# Patient Record
Sex: Male | Born: 1940 | Race: White | Hispanic: No | Marital: Married | Smoking: Former smoker
Health system: Southern US, Community
[De-identification: ages and names within clinical notes are randomized; demographics above are authoritative.]

## PROBLEM LIST (undated history)

## (undated) DIAGNOSIS — E785 Hyperlipidemia, unspecified: Secondary | ICD-10-CM

## (undated) DIAGNOSIS — C3492 Malignant neoplasm of unspecified part of left bronchus or lung: Secondary | ICD-10-CM

## (undated) DIAGNOSIS — N200 Calculus of kidney: Secondary | ICD-10-CM

## (undated) DIAGNOSIS — M1990 Unspecified osteoarthritis, unspecified site: Secondary | ICD-10-CM

## (undated) DIAGNOSIS — J449 Chronic obstructive pulmonary disease, unspecified: Secondary | ICD-10-CM

## (undated) DIAGNOSIS — M545 Low back pain, unspecified: Secondary | ICD-10-CM

## (undated) DIAGNOSIS — K219 Gastro-esophageal reflux disease without esophagitis: Secondary | ICD-10-CM

## (undated) DIAGNOSIS — C801 Malignant (primary) neoplasm, unspecified: Secondary | ICD-10-CM

## (undated) DIAGNOSIS — I10 Essential (primary) hypertension: Secondary | ICD-10-CM

## (undated) DIAGNOSIS — I2510 Atherosclerotic heart disease of native coronary artery without angina pectoris: Secondary | ICD-10-CM

## (undated) HISTORY — DX: Low back pain, unspecified: M54.50

## (undated) HISTORY — DX: Hyperlipidemia, unspecified: E78.5

## (undated) HISTORY — DX: Gastro-esophageal reflux disease without esophagitis: K21.9

## (undated) HISTORY — DX: Unspecified osteoarthritis, unspecified site: M19.90

## (undated) HISTORY — DX: Chronic obstructive pulmonary disease, unspecified: J44.9

## (undated) HISTORY — DX: Calculus of kidney: N20.0

## (undated) HISTORY — DX: Low back pain: M54.5

## (undated) HISTORY — PX: CORONARY ARTERY BYPASS GRAFT: SHX141

## (undated) HISTORY — PX: APPENDECTOMY: SHX54

## (undated) HISTORY — DX: Malignant neoplasm of unspecified part of left bronchus or lung: C34.92

## (undated) HISTORY — PX: HEMORRHOID SURGERY: SHX153

## (undated) HISTORY — DX: Atherosclerotic heart disease of native coronary artery without angina pectoris: I25.10

---

## 2002-10-10 ENCOUNTER — Emergency Department (HOSPITAL_COMMUNITY): Admission: EM | Admit: 2002-10-10 | Discharge: 2002-10-10 | Payer: Self-pay | Admitting: Emergency Medicine

## 2002-10-24 ENCOUNTER — Encounter: Payer: Self-pay | Admitting: Internal Medicine

## 2002-10-24 ENCOUNTER — Ambulatory Visit (HOSPITAL_COMMUNITY): Admission: RE | Admit: 2002-10-24 | Discharge: 2002-10-24 | Payer: Self-pay | Admitting: Internal Medicine

## 2003-08-17 ENCOUNTER — Encounter: Payer: Self-pay | Admitting: Internal Medicine

## 2003-08-17 ENCOUNTER — Ambulatory Visit (HOSPITAL_COMMUNITY): Admission: RE | Admit: 2003-08-17 | Discharge: 2003-08-17 | Payer: Self-pay | Admitting: Internal Medicine

## 2003-10-03 ENCOUNTER — Encounter (INDEPENDENT_AMBULATORY_CARE_PROVIDER_SITE_OTHER): Payer: Self-pay | Admitting: Specialist

## 2003-10-03 ENCOUNTER — Ambulatory Visit (HOSPITAL_COMMUNITY): Admission: RE | Admit: 2003-10-03 | Discharge: 2003-10-03 | Payer: Self-pay | Admitting: General Surgery

## 2003-10-03 ENCOUNTER — Ambulatory Visit (HOSPITAL_BASED_OUTPATIENT_CLINIC_OR_DEPARTMENT_OTHER): Admission: RE | Admit: 2003-10-03 | Discharge: 2003-10-03 | Payer: Self-pay | Admitting: General Surgery

## 2004-10-10 ENCOUNTER — Ambulatory Visit: Payer: Self-pay | Admitting: Internal Medicine

## 2004-12-10 ENCOUNTER — Ambulatory Visit: Payer: Self-pay | Admitting: Internal Medicine

## 2004-12-30 ENCOUNTER — Ambulatory Visit: Payer: Self-pay | Admitting: Internal Medicine

## 2005-01-08 ENCOUNTER — Ambulatory Visit: Payer: Self-pay | Admitting: Cardiology

## 2005-01-09 ENCOUNTER — Ambulatory Visit: Payer: Self-pay | Admitting: Cardiology

## 2005-01-10 ENCOUNTER — Ambulatory Visit: Payer: Self-pay | Admitting: Internal Medicine

## 2005-01-10 ENCOUNTER — Inpatient Hospital Stay (HOSPITAL_BASED_OUTPATIENT_CLINIC_OR_DEPARTMENT_OTHER): Admission: RE | Admit: 2005-01-10 | Discharge: 2005-01-10 | Payer: Self-pay | Admitting: Internal Medicine

## 2005-01-10 ENCOUNTER — Inpatient Hospital Stay (HOSPITAL_COMMUNITY): Admission: AD | Admit: 2005-01-10 | Discharge: 2005-01-22 | Payer: Self-pay | Admitting: Internal Medicine

## 2005-01-24 ENCOUNTER — Emergency Department (HOSPITAL_COMMUNITY): Admission: EM | Admit: 2005-01-24 | Discharge: 2005-01-25 | Payer: Self-pay | Admitting: Emergency Medicine

## 2005-01-27 ENCOUNTER — Ambulatory Visit: Payer: Self-pay | Admitting: Internal Medicine

## 2005-01-27 ENCOUNTER — Encounter: Payer: Self-pay | Admitting: Internal Medicine

## 2005-01-27 ENCOUNTER — Inpatient Hospital Stay (HOSPITAL_COMMUNITY): Admission: AD | Admit: 2005-01-27 | Discharge: 2005-01-29 | Payer: Self-pay | Admitting: Internal Medicine

## 2005-02-04 ENCOUNTER — Ambulatory Visit: Payer: Self-pay | Admitting: Cardiology

## 2005-02-05 ENCOUNTER — Ambulatory Visit: Payer: Self-pay | Admitting: Internal Medicine

## 2005-03-10 ENCOUNTER — Encounter (HOSPITAL_COMMUNITY): Admission: RE | Admit: 2005-03-10 | Discharge: 2005-06-08 | Payer: Self-pay | Admitting: Cardiology

## 2005-04-03 ENCOUNTER — Ambulatory Visit: Payer: Self-pay | Admitting: Cardiology

## 2005-04-04 ENCOUNTER — Ambulatory Visit: Payer: Self-pay | Admitting: Cardiology

## 2005-06-11 ENCOUNTER — Ambulatory Visit: Payer: Self-pay | Admitting: Cardiology

## 2005-06-20 ENCOUNTER — Ambulatory Visit: Payer: Self-pay | Admitting: Cardiology

## 2005-06-26 ENCOUNTER — Ambulatory Visit: Payer: Self-pay | Admitting: Cardiology

## 2005-07-25 ENCOUNTER — Ambulatory Visit: Payer: Self-pay | Admitting: Internal Medicine

## 2005-10-20 ENCOUNTER — Ambulatory Visit: Payer: Self-pay | Admitting: Cardiology

## 2005-12-25 ENCOUNTER — Ambulatory Visit: Payer: Self-pay | Admitting: Cardiology

## 2006-01-12 ENCOUNTER — Ambulatory Visit: Payer: Self-pay | Admitting: Internal Medicine

## 2006-04-16 ENCOUNTER — Ambulatory Visit: Payer: Self-pay | Admitting: Internal Medicine

## 2006-04-27 ENCOUNTER — Ambulatory Visit: Payer: Self-pay | Admitting: Cardiology

## 2006-04-29 ENCOUNTER — Ambulatory Visit: Payer: Self-pay | Admitting: Internal Medicine

## 2006-09-13 ENCOUNTER — Emergency Department (HOSPITAL_COMMUNITY): Admission: EM | Admit: 2006-09-13 | Discharge: 2006-09-13 | Payer: Self-pay | Admitting: Family Medicine

## 2006-12-17 ENCOUNTER — Emergency Department (HOSPITAL_COMMUNITY): Admission: EM | Admit: 2006-12-17 | Discharge: 2006-12-17 | Payer: Self-pay | Admitting: Family Medicine

## 2007-02-17 ENCOUNTER — Ambulatory Visit: Payer: Self-pay | Admitting: Internal Medicine

## 2007-02-19 ENCOUNTER — Ambulatory Visit: Payer: Self-pay | Admitting: Internal Medicine

## 2007-02-19 LAB — CONVERTED CEMR LAB
AST: 21 units/L (ref 0–37)
Basophils Absolute: 0 10*3/uL (ref 0.0–0.1)
Basophils Relative: 0.6 % (ref 0.0–1.0)
Bilirubin, Direct: 0.1 mg/dL (ref 0.0–0.3)
Calcium: 9 mg/dL (ref 8.4–10.5)
Eosinophils Relative: 2.7 % (ref 0.0–5.0)
HCT: 39.6 % (ref 39.0–52.0)
Hemoglobin, Urine: NEGATIVE
Ketones, ur: NEGATIVE mg/dL
LDL Cholesterol: 64 mg/dL (ref 0–99)
Leukocytes, UA: NEGATIVE
MCHC: 34.1 g/dL (ref 30.0–36.0)
Monocytes Absolute: 0.5 10*3/uL (ref 0.2–0.7)
Monocytes Relative: 8 % (ref 3.0–11.0)
Neutro Abs: 3.4 10*3/uL (ref 1.4–7.7)
Neutrophils Relative %: 55.3 % (ref 43.0–77.0)
PSA: 1.45 ng/mL (ref 0.10–4.00)
Platelets: 199 10*3/uL (ref 150–400)
Potassium: 4.3 meq/L (ref 3.5–5.1)
RDW: 12.7 % (ref 11.5–14.6)
Specific Gravity, Urine: >=1.03 (ref 1.000–1.03)
Total Bilirubin: 0.8 mg/dL (ref 0.3–1.2)
Total Protein: 6 g/dL (ref 6.0–8.3)
Vit D, 1,25-Dihydroxy: 21 (ref 20–57)
WBC: 6.2 10*3/uL (ref 4.5–10.5)
pH: 5.5 (ref 5.0–8.0)

## 2007-06-16 ENCOUNTER — Ambulatory Visit: Payer: Self-pay | Admitting: Endocrinology

## 2007-06-25 ENCOUNTER — Ambulatory Visit: Payer: Self-pay | Admitting: Cardiology

## 2007-08-13 ENCOUNTER — Ambulatory Visit: Payer: Self-pay | Admitting: Cardiology

## 2007-09-17 ENCOUNTER — Emergency Department (HOSPITAL_COMMUNITY): Admission: EM | Admit: 2007-09-17 | Discharge: 2007-09-17 | Payer: Self-pay | Admitting: Emergency Medicine

## 2007-10-08 ENCOUNTER — Ambulatory Visit: Payer: Self-pay | Admitting: Cardiology

## 2007-10-08 LAB — CONVERTED CEMR LAB
Albumin: 3.8 g/dL (ref 3.5–5.2)
Bilirubin, Direct: 0.1 mg/dL (ref 0.0–0.3)
Cholesterol: 137 mg/dL (ref 0–200)
HDL: 37.9 mg/dL — ABNORMAL LOW (ref >39.0)
LDL Cholesterol: 84 mg/dL (ref 0–99)
Total Bilirubin: 0.8 mg/dL (ref 0.3–1.2)
Total CHOL/HDL Ratio: 3.6
Triglycerides: 78 mg/dL (ref 0–149)

## 2008-01-12 ENCOUNTER — Ambulatory Visit: Payer: Self-pay | Admitting: Internal Medicine

## 2008-01-12 DIAGNOSIS — K219 Gastro-esophageal reflux disease without esophagitis: Secondary | ICD-10-CM | POA: Insufficient documentation

## 2008-01-12 DIAGNOSIS — I2510 Atherosclerotic heart disease of native coronary artery without angina pectoris: Secondary | ICD-10-CM | POA: Insufficient documentation

## 2008-01-12 DIAGNOSIS — R079 Chest pain, unspecified: Secondary | ICD-10-CM | POA: Insufficient documentation

## 2008-01-12 DIAGNOSIS — M1990 Unspecified osteoarthritis, unspecified site: Secondary | ICD-10-CM | POA: Insufficient documentation

## 2008-01-12 DIAGNOSIS — J4489 Other specified chronic obstructive pulmonary disease: Secondary | ICD-10-CM | POA: Insufficient documentation

## 2008-01-12 DIAGNOSIS — J449 Chronic obstructive pulmonary disease, unspecified: Secondary | ICD-10-CM

## 2008-01-12 DIAGNOSIS — E785 Hyperlipidemia, unspecified: Secondary | ICD-10-CM | POA: Insufficient documentation

## 2008-01-18 ENCOUNTER — Ambulatory Visit: Payer: Self-pay

## 2008-02-23 ENCOUNTER — Ambulatory Visit: Payer: Self-pay | Admitting: Internal Medicine

## 2008-02-23 DIAGNOSIS — J069 Acute upper respiratory infection, unspecified: Secondary | ICD-10-CM | POA: Insufficient documentation

## 2008-07-21 ENCOUNTER — Ambulatory Visit: Payer: Self-pay | Admitting: Cardiology

## 2008-07-24 ENCOUNTER — Ambulatory Visit: Payer: Self-pay | Admitting: Cardiology

## 2008-07-24 LAB — CONVERTED CEMR LAB
Basophils Absolute: 0 10*3/uL (ref 0.0–0.1)
Eosinophils Absolute: 0.1 10*3/uL (ref 0.0–0.7)
Eosinophils Relative: 1.6 % (ref 0.0–5.0)
MCV: 95.1 fL (ref 78.0–100.0)
Monocytes Relative: 10.9 % (ref 3.0–12.0)
Neutro Abs: 4.2 10*3/uL (ref 1.4–7.7)
Platelets: 205 10*3/uL (ref 150–400)
RBC: 4.51 M/uL (ref 4.22–5.81)
WBC: 7.5 10*3/uL (ref 4.5–10.5)

## 2008-10-16 ENCOUNTER — Ambulatory Visit: Payer: Self-pay | Admitting: Cardiology

## 2009-01-30 ENCOUNTER — Ambulatory Visit: Payer: Self-pay | Admitting: Internal Medicine

## 2009-01-30 LAB — CONVERTED CEMR LAB
AST: 21 units/L (ref 0–37)
Albumin: 3.8 g/dL (ref 3.5–5.2)
BUN: 17 mg/dL (ref 6–23)
CO2: 30 meq/L (ref 19–32)
Calcium: 9.2 mg/dL (ref 8.4–10.5)
Chloride: 108 meq/L (ref 96–112)
Cholesterol: 145 mg/dL (ref 0–200)
HDL: 42.5 mg/dL (ref >39.00)
TSH: 1.67 microintl units/mL (ref 0.35–5.50)
Total CHOL/HDL Ratio: 3
Triglycerides: 77 mg/dL (ref 0.0–149.0)

## 2009-05-01 ENCOUNTER — Ambulatory Visit: Payer: Self-pay | Admitting: Internal Medicine

## 2009-05-01 DIAGNOSIS — M545 Low back pain: Secondary | ICD-10-CM

## 2009-08-01 ENCOUNTER — Ambulatory Visit: Payer: Self-pay | Admitting: Internal Medicine

## 2009-08-01 DIAGNOSIS — R0989 Other specified symptoms and signs involving the circulatory and respiratory systems: Secondary | ICD-10-CM

## 2009-08-01 DIAGNOSIS — R0609 Other forms of dyspnea: Secondary | ICD-10-CM

## 2009-08-10 ENCOUNTER — Ambulatory Visit: Payer: Self-pay | Admitting: Internal Medicine

## 2009-08-13 ENCOUNTER — Encounter: Payer: Self-pay | Admitting: Internal Medicine

## 2009-08-17 ENCOUNTER — Ambulatory Visit: Payer: Self-pay | Admitting: Internal Medicine

## 2009-08-28 ENCOUNTER — Telehealth (INDEPENDENT_AMBULATORY_CARE_PROVIDER_SITE_OTHER): Payer: Self-pay | Admitting: *Deleted

## 2009-08-29 ENCOUNTER — Ambulatory Visit: Payer: Self-pay

## 2009-08-29 ENCOUNTER — Encounter (HOSPITAL_COMMUNITY): Admission: RE | Admit: 2009-08-29 | Discharge: 2009-11-01 | Payer: Self-pay | Admitting: Internal Medicine

## 2009-08-29 ENCOUNTER — Ambulatory Visit: Payer: Self-pay | Admitting: Cardiology

## 2010-01-30 ENCOUNTER — Ambulatory Visit: Payer: Self-pay | Admitting: Cardiology

## 2010-01-31 ENCOUNTER — Ambulatory Visit: Payer: Self-pay | Admitting: Cardiology

## 2010-02-10 LAB — CONVERTED CEMR LAB
Cholesterol: 151 mg/dL (ref 0–200)
LDL Cholesterol: 86 mg/dL (ref 0–99)
Total Protein: 6.1 g/dL (ref 6.0–8.3)
Triglycerides: 84 mg/dL (ref 0.0–149.0)

## 2010-02-12 ENCOUNTER — Encounter: Payer: Self-pay | Admitting: Cardiology

## 2010-02-13 ENCOUNTER — Telehealth: Payer: Self-pay | Admitting: Internal Medicine

## 2010-02-13 ENCOUNTER — Ambulatory Visit: Payer: Self-pay | Admitting: Internal Medicine

## 2010-02-13 DIAGNOSIS — R0602 Shortness of breath: Secondary | ICD-10-CM | POA: Insufficient documentation

## 2010-03-07 ENCOUNTER — Telehealth: Payer: Self-pay | Admitting: Internal Medicine

## 2010-03-11 ENCOUNTER — Telehealth: Payer: Self-pay | Admitting: Internal Medicine

## 2010-03-18 ENCOUNTER — Ambulatory Visit: Payer: Self-pay | Admitting: Internal Medicine

## 2010-03-18 DIAGNOSIS — R490 Dysphonia: Secondary | ICD-10-CM | POA: Insufficient documentation

## 2010-03-18 DIAGNOSIS — R05 Cough: Secondary | ICD-10-CM

## 2010-03-22 ENCOUNTER — Ambulatory Visit: Payer: Self-pay | Admitting: Internal Medicine

## 2010-03-22 ENCOUNTER — Encounter: Payer: Self-pay | Admitting: Internal Medicine

## 2010-03-28 ENCOUNTER — Ambulatory Visit: Payer: Self-pay | Admitting: Internal Medicine

## 2010-03-28 ENCOUNTER — Encounter: Payer: Self-pay | Admitting: Internal Medicine

## 2010-03-28 DIAGNOSIS — R930 Abnormal findings on diagnostic imaging of skull and head, not elsewhere classified: Secondary | ICD-10-CM

## 2010-03-28 DIAGNOSIS — L570 Actinic keratosis: Secondary | ICD-10-CM

## 2010-03-29 DIAGNOSIS — J984 Other disorders of lung: Secondary | ICD-10-CM

## 2010-04-03 ENCOUNTER — Encounter (HOSPITAL_COMMUNITY): Admission: RE | Admit: 2010-04-03 | Discharge: 2010-07-02 | Payer: Self-pay | Admitting: Internal Medicine

## 2010-04-03 ENCOUNTER — Encounter: Payer: Self-pay | Admitting: Internal Medicine

## 2010-06-19 ENCOUNTER — Emergency Department (HOSPITAL_COMMUNITY): Admission: EM | Admit: 2010-06-19 | Discharge: 2010-06-19 | Payer: Self-pay | Admitting: Emergency Medicine

## 2010-06-21 ENCOUNTER — Ambulatory Visit: Payer: Self-pay | Admitting: Cardiology

## 2010-11-22 ENCOUNTER — Ambulatory Visit
Admission: RE | Admit: 2010-11-22 | Discharge: 2010-11-22 | Payer: Self-pay | Source: Home / Self Care | Attending: Internal Medicine | Admitting: Internal Medicine

## 2010-11-22 DIAGNOSIS — M546 Pain in thoracic spine: Secondary | ICD-10-CM | POA: Insufficient documentation

## 2010-11-22 DIAGNOSIS — R0989 Other specified symptoms and signs involving the circulatory and respiratory systems: Secondary | ICD-10-CM | POA: Insufficient documentation

## 2010-11-22 DIAGNOSIS — G4700 Insomnia, unspecified: Secondary | ICD-10-CM | POA: Insufficient documentation

## 2010-12-01 LAB — CONVERTED CEMR LAB
AST: 21 units/L (ref 0–37)
Albumin: 3.8 g/dL (ref 3.5–5.2)
Alkaline Phosphatase: 56 units/L (ref 39–117)
BUN: 16 mg/dL (ref 6–23)
CO2: 29 meq/L (ref 19–32)
GFR calc Af Amer: 86 mL/min
Glucose, Bld: 86 mg/dL (ref 70–99)
Potassium: 4.5 meq/L (ref 3.5–5.1)
Sodium: 142 meq/L (ref 135–145)
Total Bilirubin: 0.8 mg/dL (ref 0.3–1.2)
Total CK: 101 units/L (ref 7–195)

## 2010-12-03 ENCOUNTER — Encounter: Payer: Self-pay | Admitting: Internal Medicine

## 2010-12-04 ENCOUNTER — Ambulatory Visit: Admit: 2010-12-04 | Payer: Self-pay

## 2010-12-04 ENCOUNTER — Encounter (INDEPENDENT_AMBULATORY_CARE_PROVIDER_SITE_OTHER): Payer: Medicare Other

## 2010-12-04 ENCOUNTER — Encounter: Payer: Self-pay | Admitting: Internal Medicine

## 2010-12-04 DIAGNOSIS — R0989 Other specified symptoms and signs involving the circulatory and respiratory systems: Secondary | ICD-10-CM

## 2010-12-05 NOTE — Assessment & Plan Note (Signed)
Summary: 82m reck/klw   Visit Type:  Follow-up Copy to:  Dr Antoine Poche - Cards Primary Provider/Referring Provider:  Tresa Garter MD - PMD, Dr. Antoine Poche - [place], Dr. Marchelle Gearing - [place] Minor, Dr Marina Goodell - GI., Dr Earlene Plater - Urology  CC:  pt here for one month follow-up.  Pt scheduled for rehab in June.Marland Kitchen  History of Present Illness: IOV 02/13/2010: 70 year old male ex-smoker, with CAD but negative perfusion study Nov 2010. c/o Dyspnea. Insidious onset over 1 year ago.  He thinks he started noticing it after he retired from work 2 years ago and he and wife think they started geting more attentive about it. Brought on by exertion. Relieved by rest.  Course is one of gradual progression but he really is not sure. Currently activities like using his chain saw, walking across field and walking back on uphill, possibly 1 flight of steps. However, playing golf with cart. groceries at [employer] or changing clothes do not make him dyspneic. Overall rates his dyspnea as moderate.   Dyspnea is associated with cough. Cough has been present a 'pretty good while' approximately one year. Feels that something is stuck in throat but needs to cough for before bringiing it out. Then, the sputum is scanty and clear. Never had hemoptysis.   Also has associated wheezing that is present 'all the time' for past 1 year or so or maybe longer.   He saw Dr. Posey Rea for the same around March/Apil 2010. Was started on spiriva empirically but changed to symbicort in Sept 2010 which he takes only 1 puff bid. Patient thinks is due to cost issue and fear of urinary retention. Then, in  OCt 2010 PFTs showd GOld stage 3 COPD with BD response. SAw Dr. Antoine Poche March 2011 who felt dyspnea unrleated to CAD and that symbicort possibly not helping. Terefore,referred patient here.  REC Start symbicort and spiriva Return with spiro refer rehab retest for o2 with exertion OV 03/18/2010: Followup for dyspnea due to copd/asthma,  hoarseness and cough. OVerall improved dyspnea. More tolerable now. Still dyspneic with some heavy exertion. Cough persists. Still c/o unchanged sensation of sputum being stuck in throat. He is also very concerned that he might have lung cancer and is requesting CT scan. Repeat spirometry today shows Fev1 and FVC have normalized. Nevertheless he desaturates on exertion.   CardioPerfect Spirometry  ID: [id number] Patient: Cameron [name], Cameron [name] DOB: [birth year] Age: 70 Years Old Sex: Male Race: White Physician: Tresa Garter MD Height: 68 Weight: 153 Status: Unconfirmed Past Medical History:  COPD/Asthma > PFTs 08/10/2009: Fev1 1.43L/50%, 230cc/16% BD response, TLC 1105, RV 117%, DLCO 10.5/55% Coronary artery disease(status post stenting to    the right coronary artery with subsequent CABG.  He had LIMA to the LAD,    SVG to diagonal, SVG to obtuse marginal, and SVG to PDA ),  Hyperlipidemia Osteoarthritis GERD Low back pain  Recorded: 03/18/2010 3:00 PM  Parameter  Measured Predicted %Predicted FVC     5.12        4.12        124.10 FEV1     3.12        3.04        102.40 FEV1%   60.91        74.02        82.30 PEF    7.72        8.05        95.90   Interpretation: much improved. Normalized spirometry  Current  Medications (verified): 1)  Toprol Xl 25 Mg Tb24 (Metoprolol Succinate) .Marland Kitchen.. 1 By Mouth Daily 2)  Aspirin 325 Mg Tabs (Aspirin) .... Take 1 Tab By Mouth Every Day 3)  Terazosin Hcl 2 Mg  Caps (Terazosin Hcl) .Marland Kitchen.. 1 By Mouth At Bedtime 4)  Omeprazole 20 Mg Cpdr (Omeprazole) .Marland Kitchen.. 1 Tab Once A Day 5)  Diovan 80 Mg Tabs (Valsartan) .Marland Kitchen.. 1 By Mouth Daily 6)  Fish Oil   Oil (Fish Oil) 7)  Pravastatin Sodium 40 Mg Tabs (Pravastatin Sodium) .... Once Daily 8)  Nitrostat 0.4 Mg Subl (Nitroglycerin) .Marland Kitchen.. 1 Sl As Needed As Dirr 9)  Symbicort 160-4.5 Mcg/act  Aero (Budesonide-Formoterol Fumarate) .... 2 Inh Bid 10)  Spiriva Handihaler 18 Mcg Caps (Tiotropium Bromide Monohydrate)  .... 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 Smoked x 40+ years 1 ppd.   Regular exercise-no, golf  Risk Factors: Exercise: no (01/12/2008)  Risk Factors: Smoking Status: quit (01/12/2008)  Past Medical History: #COPD/Asthma > PFTs 08/10/2009: Fev1 1.43L/50%, 230cc/16% BD response, TLC 1105, RV 117%, DLCO 10.5/55% > Cleda Daub 03/18/2010: Fev1 3.12/102%, FVC 5.12/124% after spiriva and symbicort  #Exertional hypoxemia > 03/18/2010: desaturated to 84% after 185 feet  #Coronary artery disease(status post stenting to    the right coronary artery with subsequent CABG.  He had LIMA to the LAD,    SVG to diagonal, SVG to obtuse marginal, and SVG to PDA ),  #Hyperlipidemia #Osteoarthritis #GERD #Low back pain  Past Surgical History: Reviewed history from 02/13/2010 and no changes required. Coronary artery bypass graft 2006 Appendectomy Hemorrhoidectomy  Family History: Reviewed history from 02/13/2010 and no changes required. Family History Hypertension Mother-MI Father-MI Brother-MI  Social History: Reviewed history from 02/13/2010 and no changes required. 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 Smoked x 40+ years 1 ppd.   Regular exercise-no, golf  Review of Systems       The patient complains of shortness of breath with activity and productive cough.  The patient denies shortness of breath at rest, non-productive cough, coughing up blood, chest pain, irregular heartbeats, acid heartburn, indigestion, loss of appetite, weight change, abdominal pain, difficulty swallowing, sore throat, tooth/dental problems, headaches, nasal congestion/difficulty breathing through nose, sneezing, itching, ear ache, anxiety,  depression, hand/feet swelling, joint stiffness or pain, rash, change in color of mucus, and fever.    Vital Signs:  Patient profile:   70 year old male Height:      68 inches Weight:      153 pounds O2 Sat:      95 % on Room air Temp:     97.7 degrees F oral Pulse rate:   77 / minute BP sitting:   102 / 56  (right arm) Cuff size:   regular  Vitals Entered By: Carron Curie CMA (Mar 18, 2010 2:55 PM)  O2 Flow:  Room air  Serial Vital Signs/Assessments:  Comments: Ambulatory Pulse Oximetry  Resting; HR__78___    02 Sat__97%RA___  Lap1 (185 feet)   HR_100____   02 Sat__85%RA___ Lap2 (185 feet)   HR_____   02 Sat_____    Lap3 (185 feet)   HR_____   02 Sat_____  ___Test Completed without Difficulty _X__Test Stopped due to: pt desat on the first lap half way to 85% Mindy Silva SMA  Mar 18, 2010 3:11 PM    By: Michel Bickers CMA  placed pt on 2 liters oxygen and pt recovered to 92%. Carron Curie CMA  Mar 18, 2010 4:01 PM  CC: pt here for one month follow-up.  Pt scheduled for rehab in June. Comments Medications reviewed with patient Daytime phone number verified with patient. Carron Curie CMA  Mar 18, 2010 2:56 PM    Physical Exam  General:  well developed, well nourished, in no acute distress Head:  normocephalic and atraumatic Eyes:  PERRLA/EOM intact; conjunctiva and sclera clear Ears:  TMs intact and clear with normal canals Nose:  no deformity, discharge, inflammation, or lesions Mouth:  no deformity or lesions Neck:  no masses, thyromegaly, or abnormal cervical nodes Chest Wall:  cabg scar + Lungs:  clear bilaterally to auscultation and percussion Heart:  regular rate and rhythm, S1, S2 without murmurs, rubs, gallops, or clicks Abdomen:  bowel sounds positive; abdomen soft and non-tender without masses, or organomegaly Msk:  no deformity or scoliosis noted with normal posture Pulses:  pulses normal Extremities:  no clubbing, cyanosis, edema, or  deformity noted Neurologic:  CN II-XII grossly intact with normal reflexes, coordination, muscle strength and tone Skin:  intact without lesions or rashes Cervical Nodes:  no significant adenopathy Axillary Nodes:  no significant adenopathy Psych:  alert and cooperative; normal mood and affect; normal attention span and concentration   Pulmonary Function Test Date: 03/18/2010 3:00 PM Gender: Male  Pre-Spirometry FVC    Value: 5.12 L/min   % Pred: 124.10 % FEV1    Value: 3.12 L     Pred: 3.04 L     % Pred: 102.40 % FEV1/FVC  Value: 60.91 %     % Pred: 82.30 %  Impression & Recommendations:  Problem # 1:  HOARSENESS (XBM-841.32) Assessment New  Orders: ENT Referral (ENT) Est. Patient Level IV (44010)  Problem # 2:  SPECIAL SCREENING MALIG NEOPLASM RESP ORGN (ICD-V76.0) Assessment: New We discussed current literature on screening for cancer and that more definitive data is needed. Discussed concept of lead time bias. Explained CXR is normal. Explained radiation exposure and small incidental nodules need serial followup over long periods of time. He stil wants CT scan tor "rule out lung cancer". So, we will get one Orders: Radiology Referral (Radiology) Est. Patient Level IV (27253)  Problem # 3:  C O P D (ICD-496) Assessment: Improved  Sprio has normalized but he desaturates with exertion. He has COPD with ashma I think. dYSPNEA somehwat improved with inhalers but still has room for improvement  PLan have blood test for genetic cause of emphysema start attending pulm rehab contnue spiriva and symbicort your breating test is better but your oxygen needs are not you need oxygen with walking >100 feet and with sleep strongly reconsider your decision not to have oxygen  Orders: T- * Misc. Laboratory test [phone]) DME Referral (DME) Radiology Referral (Radiology) Est. Patient Level IV (34742)  Problem # 4:  COUGH (ICD-786.2) Assessment: Unchanged  cough is unchanged.  Have asked him to trial off fish oil and call us with respoonse  Orders: Est. Patient Level IV (59563)  Patient Instructions: 1)  have blood test for genetic cause of emphysema 2)  start attending pulm rehab 3)  contnue spiriva and symbicort 4)  your breating test is better but your oxygen needs are not 5)  you need oxygen with walking >100 feet and with sleep 6)  strongly reconsider your decision not to  have oxygen 7)  have CT scan chest for lung cancer surveillance  8)  stop fish oil for 1 month to see if cough is better 9)  call back in 1 month with how your cough doess off fish oil 10)  please see ENT doctor for voice hoarseness 11)  return in 4 months or sooner if sick  Appended Document: 67m reck/klw got reesults on alpha 1. THEre is no defiiciency. Pls let him know  Appended Document: 37m reck/klw LMTCB.  Appended Document: 71m reck/klw pt advised.

## 2010-12-05 NOTE — Consult Note (Signed)
Summary: Narda Bonds MD  ENT  Narda Bonds MD  ENT   Imported By: Sherian Rein 04/05/2010 15:04:47  _____________________________________________________________________  External Attachment:    Type:   Image     Comment:   External Document

## 2010-12-05 NOTE — Progress Notes (Signed)
Summary: Omeprazole refill  Phone Note Refill Request Message from:  Fax from Pharmacy on Mar 07, 2010 2:43 PM  Refills Requested: Medication #1:  PRILOSEC OTC 20 MG TBEC one by mouth daily Initial call taken by: Lucious Groves,  Mar 07, 2010 2:43 PM    Prescriptions: PRILOSEC OTC 20 MG TBEC (OMEPRAZOLE MAGNESIUM) one by mouth daily  #90 x 3   Entered by:   Lucious Groves   Authorized by:   Tresa Garter MD   Signed by:   Lucious Groves on 03/07/2010   Method used:   Electronically to        [facility]* (mail-order)             ,          Ph: [phone]       Fax: [phone]   RxID:   [id number]

## 2010-12-05 NOTE — Letter (Signed)
Summary: Custom - Lipid  [facility], Main Office  [address]   [place], [place] [address]   Phone: [phone]  Fax: [phone]         February 12, 2010 MRN: [id number]     PERETZ THIEME [address] [place], [place]  [address]     Dear Mr. Ellefson,    We have reviewed your cholesterol results.  They are as follows:     Total Cholesterol:    151 (Desirable: less than 200)       HDL  Cholesterol:     47.80  (Desirable: greater than 40 for men and 50 for women)       LDL Cholesterol:       86  (Desirable: less than 100 for low risk and less than 70 for moderate to high risk)       Triglycerides:       84.0  (Desirable: less than 150)  Our recommendations include:  Continue the same medications.  Your liver functions were also normal.   Call our office at the number listed above if you have any questions.  Lowering your LDL cholesterol is important, but it is only one of a large number of "risk factors" that may indicate that you are at risk for heart disease, stroke or other complications of hardening of the arteries.  Other risk factors include:   A.  Cigarette Smoking* B.  High Blood Pressure* C.  Obesity* D.   Low HDL Cholesterol (see yours above)* E.   Diabetes Mellitus (higher risk if your is uncontrolled) F.  Family history of premature heart disease G.  Previous history of stroke or cardiovascular disease        *These are risk factors YOU HAVE CONTROL OVER.  For more information, visit .  There is now evidence that lowering the TOTAL CHOLESTEROL AND LDL CHOLESTEROL can reduce the risk of heart disease.  The American Heart Association recommends the following guidelines for the treatment of elevated cholesterol:  1.  If there is now current heart disease and less than two risk factors, TOTAL CHOLESTEROL should be less than 200 and LDL CHOLESTEROL should be less than 100. 2.  If there is current heart disease or two or more risk  factors, TOTAL CHOLESTEROL should be less than 200 and LDL CHOLESTEROL should be less than 70.  A diet low in cholesterol, saturated fat, and calories is the cornerstone of treatment for elevated cholesterol.  Cessation of smoking and exercise are also important in the management of elevated cholesterol and preventing vascular disease.  Studies have shown that 30 to 60 minutes of physical activity most days can help lower blood pressure, lower cholesterol, and keep your weight at a healthy level.  Drug therapy is used when cholesterol levels do not respond to therapeutic lifestyle changes (smoking cessation, diet, and exercise) and remains unacceptably high.  If medication is started, it is important to have you levels checked periodically to evaluate the need for further treatment options.      Thank you,   Sander Nephew, RN for  Dr Rollene Rotunda [facility] Team

## 2010-12-05 NOTE — Assessment & Plan Note (Signed)
Summary: SORE THROAT---STC   Vital Signs:  Patient profile:   70 year old male Height:      68 inches Weight:      150 pounds BMI:     22.89 O2 Sat:      97 % on Room air Temp:     96.8 degrees F oral Pulse rate:   64 / minute BP sitting:   128 / 86  (left arm) Cuff size:   regular  Vitals Entered By: Bill Salinas CMA (Mar 28, 2010 7:59 AM)  O2 Flow:  Room air CC: pt here with c/o sore throat with nasal drainage x 1 week/ ab   Primary Care Provider:  Tresa Garter MD - PMD, Dr. Antoine Poche - [place], Dr. Marchelle Gearing - [place]a [place], Dr Marina Goodell - GI., Dr Earlene Plater - Urology  CC:  pt here with c/o sore throat with nasal drainage x 1 week/ ab.  History of Present Illness: The patient presents with complaints of sore throat, fever, cough, sinus congestion and drainge of several days duration. Not better with OTC meds. Chest hurts with coughing. Can't sleep due to cough. Muscle aches are present.  The mucus is colored.  Is my CT back?  Current Medications (verified): 1)  Toprol Xl 25 Mg Tb24 (Metoprolol Succinate) .Marland Kitchen.. 1 By Mouth Daily 2)  Aspirin 325 Mg Tabs (Aspirin) .... Take 1 Tab By Mouth Every Day 3)  Terazosin Hcl 2 Mg  Caps (Terazosin Hcl) .Marland Kitchen.. 1 By Mouth At Bedtime 4)  Omeprazole 20 Mg Cpdr (Omeprazole) .Marland Kitchen.. 1 Tab Once A Day 5)  Diovan 80 Mg Tabs (Valsartan) .Marland Kitchen.. 1 By Mouth Daily 6)  Fish Oil   Oil (Fish Oil) 7)  Pravastatin Sodium 40 Mg Tabs (Pravastatin Sodium) .... Once Daily 8)  Nitrostat 0.4 Mg Subl (Nitroglycerin) .Marland Kitchen.. 1 Sl As Needed As Dirr 9)  Symbicort 160-4.5 Mcg/act  Aero (Budesonide-Formoterol Fumarate) .... 2 Inh Bid 10)  Spiriva Handihaler 18 Mcg Caps (Tiotropium Bromide Monohydrate) .... Once Daily  Allergies (verified): 1)  Crestor  Past History:  Past Medical History: Last updated: 03/18/2010 #COPD/Asthma > PFTs 08/10/2009: Fev1 1.43L/50%, 230cc/16% BD response, TLC 1105, RV 117%, DLCO 10.5/55% > Cleda Daub 03/18/2010: Fev1 3.12/102%, FVC 5.12/124% after spiriva and  symbicort  #Exertional hypoxemia > 03/18/2010: desaturated to 84% after 185 feet  #Coronary artery disease(status post stenting to    the right coronary artery with subsequent CABG.  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 Smoked x 40+ years 1 ppd.   Regular exercise-no, golf  Physical Exam  General:  Well-developed,well-nourished,in no acute distress; alert,appropriate and cooperative throughout examination Mouth:  Erythematous throat mucosa and intranasal erythema.  Lungs:  CTA, no ronchi Heart:  RRR Abdomen:  Bowel sounds positive,abdomen soft and non-tender without masses, organomegaly or hernias noted. Msk:  Lumbar-sacral spine is tender to palpation over paraspinal muscles and painfull with the ROM  Extremities:  No clubbing, cyanosis, edema, or deformity noted with normal full range of motion of all joints.   Neurologic:  No cranial nerve deficits noted. Station and gait are normal. Plantar reflexes are down-going bilaterally. DTRs are symmetrical throughout. Sensory, motor and coordinative functions appear intact. Skin:  AKs on face/nose Psych:  Cognition and judgment appear intact. Alert and cooperative with normal attention  span and concentration. No apparent delusions, illusions, hallucinations   Impression & Recommendations:  Problem # 1:  UPPER RESPIRATORY INFECTION, ACUTE (ICD-465.9) Assessment New  His updated medication list for this problem includes:    Aspirin 325 Mg Tabs (Aspirin) .Marland Kitchen... Take 1 tab by mouth every day  Problem # 2:  C O P D (ICD-496) Assessment: Unchanged Starting Pulm Rehab His updated medication list for this problem includes:    Symbicort 160-4.5 Mcg/act Aero (Budesonide-formoterol fumarate) .Marland Kitchen... 2 inh bid    Spiriva  Handihaler 18 Mcg Caps (Tiotropium bromide monohydrate) ..... Once daily  Problem # 3:  ABNORMAL CHEST XRAY (ICD-793.1)/CT Assessment: New Reviewed w/pt. Repeat in 3 months  F/u w/pulm. 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 1 by mouth daily 2)  Aspirin 325 Mg Tabs (Aspirin) .... Take 1 tab by mouth every day 3)  Terazosin Hcl 2 Mg Caps (Terazosin hcl) .Marland Kitchen.. 1 by mouth at bedtime 4)  Omeprazole 20 Mg Cpdr (Omeprazole) .Marland Kitchen.. 1 tab once a day 5)  Diovan 80 Mg Tabs (Valsartan) .Marland Kitchen.. 1 by mouth daily 6)  Fish Oil Oil (Fish oil) 7)  Pravastatin Sodium 40 Mg Tabs (Pravastatin sodium) .... Once daily 8)  Nitrostat 0.4 Mg Subl (Nitroglycerin) .Marland Kitchen.. 1 sl as needed as dirr 9)  Symbicort 160-4.5 Mcg/act Aero (Budesonide-formoterol fumarate) .... 2 inh bid 10)  Spiriva Handihaler 18 Mcg Caps (Tiotropium bromide monohydrate) .... Once daily 11)  Zithromax Z-pak 250 Mg Tabs (Azithromycin) .... As dirrected  Patient Instructions: 1)  Call if you are not better in a reasonable amount of time or if worse. 2)  We will repeat CT in 3 months   Prescriptions: SYMBICORT 160-4.5 MCG/ACT  AERO (BUDESONIDE-FORMOTEROL FUMARATE) 2 inh bid  #1 x 12   Entered and Authorized by:   Tresa Garter MD   Signed by:   Tresa Garter MD on 03/28/2010   Method used:   Print then Give to Patient   RxID:   [id number] ZITHROMAX Z-PAK 250 MG TABS (AZITHROMYCIN) as dirrected  #1 x 0   Entered and Authorized by:   Tresa Garter MD   Signed by:   Tresa Garter MD on 03/28/2010   Method used:   Print then Give to Patient   RxID:   [id number]

## 2010-12-05 NOTE — Assessment & Plan Note (Signed)
Summary: BACK PROBLEMS/NWS  #   Vital Signs:  Patient profile:   70 year old male Height:      68 inches Weight:      152 pounds BMI:     23.20 Temp:     97.5 degrees F oral Pulse rate:   68 / minute Pulse rhythm:   regular Resp:     16 per minute BP sitting:   138 / 90  (left arm) Cuff size:   regular  Vitals Entered By: Lanier Prude, CMA(AAMA) (November 22, 2010 8:25 AM) CC: Back pain X 6-8 mo Is Patient Diabetic? No Comments pt is not taking Fish oil or Spiriva   Primary Care Provider:  Tresa Garter MD - PMD, Dr. Antoine Poche - [place], Dr. Marchelle Gearing - [place], Dr Marina Goodell - GI., Dr Earlene Plater - Urology  CC:  Back pain X 6-8 mo.  History of Present Illness: C/o LBP  x 6-8 months. Walking, standing, working in the yard, other  work - all make it worse. No irrad. It is 6/10 in intensity and located in the lower thor spine. No injury. No sudden onset F/u COPD, SOB  C/o insomnia - taking Tylenol pm  Current Medications (verified): 1)  Toprol Xl 25 Mg Tb24 (Metoprolol Succinate) .Marland Kitchen.. 1 By Mouth Daily 2)  Aspirin 325 Mg Tabs (Aspirin) .... Take 1 Tab By Mouth Every Day 3)  Terazosin Hcl 5 Mg Caps (Terazosin Hcl) .Marland Kitchen.. 1 By Mouth At Bedtime 4)  Omeprazole 20 Mg Cpdr (Omeprazole) .Marland Kitchen.. 1 Tab Once A Day 5)  Diovan 80 Mg Tabs (Valsartan) .Marland Kitchen.. 1 By Mouth Daily 6)  Fish Oil   Oil (Fish Oil) 7)  Pravastatin Sodium 40 Mg Tabs (Pravastatin Sodium) .... Once Daily 8)  Nitrostat 0.4 Mg Subl (Nitroglycerin) .Marland Kitchen.. 1 Sl As Needed As Dirr 9)  Symbicort 160-4.5 Mcg/act  Aero (Budesonide-Formoterol Fumarate) .... 2 Inh Bid 10)  Spiriva Handihaler 18 Mcg Caps (Tiotropium Bromide Monohydrate) .... Once Daily  Allergies (verified): 1)  Crestor  Past History:  Past Medical History: Last updated: 03/18/2010 #COPD/Asthma > PFTs 08/10/2009: Fev1 1.43L/50%, 230cc/16% BD response, TLC 1105, RV 117%, DLCO 10.5/55% > Cleda Daub 03/18/2010: Fev1 3.12/102%, FVC 5.12/124% after spiriva and  symbicort  #Exertional hypoxemia > 03/18/2010: desaturated to 84% after 185 feet  #Coronary artery disease(status post stenting to    the right coronary artery with subsequent CABG.  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 Smoked x 40+ years 1 ppd.   Regular exercise-no, golf  Review of Systems       The patient complains of dyspnea on exertion and difficulty walking.  The patient denies fever, chest pain, abdominal pain, and depression.    Physical Exam  General:  Well-developed,well-nourished,in no acute distress; alert,appropriate and cooperative throughout examination Nose:  WNL Mouth:  Erythematous throat mucosa and intranasal erythema.  Neck:   B bruit Lungs:  CTA, no ronchi Heart:  RRR Abdomen:  Bowel sounds positive,abdomen soft and non-tender without masses, organomegaly or hernias noted. Msk:  lower thor spine is sensitive to palpation range of motion is decreased in thor and LS spine some Extremities:  No clubbing, cyanosis, edema, or deformity noted with normal full range of motion of all joints.   Neurologic:  No cranial nerve deficits  noted. Station and gait are normal. Plantar reflexes are down-going bilaterally. DTRs are symmetrical throughout. Sensory, motor and coordinative functions appear intact. Skin:  AKs on face/nose - multiple Psych:  Cognition and judgment appear intact. Alert and cooperative with normal attention span and concentration. No apparent delusions, illusions, hallucinations. Denies being depressed   Impression & Recommendations:  Problem # 1:  BACK PAIN, THORACIC REGION (ICD-724.1) MSK Assessment New See "Patient Instructions".  CT chest in Fall 2011 was reviewed His updated medication list for this problem includes:     Aspirin 325 Mg Tabs (Aspirin) .Marland Kitchen... Take 1 tab by mouth every day    Tramadol Hcl 50 Mg Tabs (Tramadol hcl) .Marland Kitchen... 1-2 tabs by mouth two times a day as needed pain  Orders: T-Thoracic Spine 2 Views [phone])  Problem # 2:  DYSPNEA (ICD-786.09) Assessment: Unchanged He refused using O2. Risks of noncompliance with treatment discussed. Compliance encouraged.  The following medications were removed from the medication list:    Spiriva Handihaler 18 Mcg Caps (Tiotropium bromide monohydrate) ..... Once daily His updated medication list for this problem includes:    Toprol Xl 25 Mg Tb24 (Metoprolol succinate) .Marland Kitchen... 1 by mouth daily    Symbicort 160-4.5 Mcg/act Aero (Budesonide-formoterol fumarate) .Marland Kitchen... 2 inh bid  Problem # 3:  C O P D (ICD-496) Assessment: Unchanged Refused using O2 The following medications were removed from the medication list:    Spiriva Handihaler 18 Mcg Caps (Tiotropium bromide monohydrate) ..... Once daily - it is too $$$ His updated medication list for this problem includes:    Symbicort 160-4.5 Mcg/act Aero (Budesonide-formoterol fumarate) .Marland Kitchen... 2 inh bid  Problem # 4:  LOW BACK PAIN (ICD-724.2) Assessment: Unchanged  His updated medication list for this problem includes:    Aspirin 325 Mg Tabs (Aspirin) .Marland Kitchen... Take 1 tab by mouth every day    Tramadol Hcl 50 Mg Tabs (Tramadol hcl) .Marland Kitchen... 1-2 tabs by mouth two times a day as needed pain  Problem # 5:  INSOMNIA, CHRONIC (ICD-307.42) Assessment: Deteriorated Try Nortriptyline  Problem # 6:  CORONARY ARTERY DISEASE (ICD-414.00) Assessment: Unchanged  His updated medication list for this problem includes:    Toprol Xl 25 Mg Tb24 (Metoprolol succinate) .Marland Kitchen... 1 by mouth daily    Aspirin 325 Mg Tabs (Aspirin) .Marland Kitchen... Take 1 tab by mouth every day    Terazosin Hcl 5 Mg Caps (Terazosin hcl) .Marland Kitchen... 1 by mouth at bedtime    Diovan 80 Mg Tabs (Valsartan) .Marland Kitchen... 1 by mouth daily    Nitrostat 0.4 Mg Subl (Nitroglycerin) .Marland Kitchen...  1 sl as needed as dirr  Problem # 7:  ACTINIC KERATOSIS (ICD-702.0) Assessment: Deteriorated Sch appt w/Dr [place]  Problem # 8:  CAROTID BRUIT (ICD-785.9) B Assessment: New  Orders: Doppler Referral (Doppler)  Complete Medication List: 1)  Toprol Xl 25 Mg Tb24 (Metoprolol succinate) .Marland Kitchen.. 1 by mouth daily 2)  Aspirin 325 Mg Tabs (Aspirin) .... Take 1 tab by mouth every day 3)  Terazosin Hcl 5 Mg Caps (Terazosin hcl) .Marland Kitchen.. 1 by mouth at bedtime 4)  Omeprazole 20 Mg Cpdr (Omeprazole) .Marland Kitchen.. 1 tab once a day 5)  Diovan 80 Mg Tabs (Valsartan) .Marland Kitchen.. 1 by mouth daily 6)  Fish Oil Oil (Fish oil) 7)  Pravastatin Sodium 40 Mg Tabs (Pravastatin sodium) .... Once daily 8)  Nitrostat 0.4 Mg Subl (Nitroglycerin) .Marland Kitchen.. 1 sl as needed as dirr 9)  Symbicort 160-4.5 Mcg/act Aero (Budesonide-formoterol fumarate) .... 2 inh bid 10)  Tramadol Hcl 50  Mg Tabs (Tramadol hcl) .Marland Kitchen.. 1-2 tabs by mouth two times a day as needed pain 11)  Nortriptyline Hcl 10 Mg Caps (Nortriptyline hcl) .Marland Kitchen.. 1-2 by mouth qhs  Other Orders: Pneumococcal Vaccine (11914) Admin 1st Vaccine (78295)  Patient Instructions: 1)  Use stretching and balance exercises that I have provided (15 min. or longer every day) 2)  Please schedule a follow-up appointment in 1 month. Prescriptions: NORTRIPTYLINE HCL 10 MG CAPS (NORTRIPTYLINE HCL) 1-2 by mouth qhs  #60 x 6   Entered and Authorized by:   Tresa Garter MD   Signed by:   Tresa Garter MD on 11/22/2010   Method used:   [facility]        [facility]  [place] [address] [facility]       [address]       [place], [place]  [address]       Ph: [phone] or [phone]       Fax: [phone]   RxID:   [id number] TRAMADOL HCL 50 MG TABS (TRAMADOL HCL) 1-2 tabs by mouth two times a day as needed pain  #100 x 3   Entered and Authorized by:   Tresa Garter MD   Signed by:   Tresa Garter MD on 11/22/2010   Method used:   [facility]        [facility]  [place] [address]       [address]       [place], [place]  [address]       Ph: [phone] or [phone]       Fax: [phone]   RxID:   [id number]    Orders Added: 1)  Est. Patient Level V [76160] 2)  Pneumococcal Vaccine [90732] 3)  Admin 1st Vaccine [90471] 4)  Doppler Referral [Doppler] 5)  T-Thoracic Spine 2 Views [72070TC]   Immunizations Administered:  Pneumonia Vaccine:    Vaccine Type: Pneumovax    Site: left deltoid    Mfr: Merck    Dose: 0.5 ml    Route: IM    Given by: Lanier Prude, CMA(AAMA)    Exp. Date: 03/27/2012    Lot #: [id number]    VIS given: 10/08/09 version given November 22, 2010.   Immunizations Administered:  Pneumonia Vaccine:    Vaccine Type: Pneumovax    Site: left deltoid    Mfr: Merck    Dose: 0.5 ml    Route: IM    Given by: Lanier Prude, CMA(AAMA)    Exp. Date: 03/27/2012    Lot #: [id number]    VIS given: 10/08/09 version given November 22, 2010.

## 2010-12-05 NOTE — Assessment & Plan Note (Signed)
Summary: COPD///kp   Visit Type:  Initial Consult Copy to:  Dr Antoine Poche - Cards Primary Provider/Referring Provider:  Tresa Garter MD - PMD, Dr. Antoine Poche - [place], Dr. Marchelle Gearing - [place], Dr Marina Goodell - GI., Dr Earlene Plater - Urology  CC:  Pulmonary Consult for SOB x less than 1 year. Marland Kitchen  History of Present Illness: IOV 02/13/2010: 71 year old male ex-smoker, with CAD but negative perfusion study Nov 2010. c/o Dyspnea. Insidious onset over 1 year ago.  He thinks he started noticing it after he retired from work 2 years ago and he and wife think they started geting more attentive about it. Brought on by exertion. Relieved by rest.  Course is one of gradual progression but he really is not sure. Currently activities like using his chain saw, walking across field and walking back on uphill, possibly 1 flight of steps. However, playing golf with cart. groceries at [employer] or changing clothes do not make him dyspneic. Overall rates his dyspnea as moderate.   Dyspnea is associated with cough. Cough has been present a 'pretty good while' approximately one year. Feels that something is stuck in throat but needs to cough for before bringiing it out. Then, the sputum is scanty and clear. Never had hemoptysis.   Also has associated wheezing that is present 'all the time' for past 1 year or so or maybe longer.   He saw Dr. Posey Rea for the same around March/Apil 2010. Was started on spiriva empirically but changed to symbicort in Sept 2010 which he takes only 1 puff bid. Patient thinks is due to cost issue and fear of urinary retention. Then, in  OCt 2010 PFTs showd GOld stage 3 COPD with BD response. SAw Dr. Antoine Poche March 2011 who felt dyspnea unrleated to CAD and that symbicort possibly not helping. Terefore,referred patient here  Current Medications (verified): 1)  Toprol Xl 25 Mg Tb24 (Metoprolol Succinate) .Marland Kitchen.. 1 By Mouth Daily 2)  Aspirin 325 Mg Tabs (Aspirin) .... Take 1 Tab By Mouth Every  Day 3)  Terazosin Hcl 2 Mg  Caps (Terazosin Hcl) .Marland Kitchen.. 1 By Mouth At Bedtime 4)  Prilosec Otc 20 Mg Tbec (Omeprazole Magnesium) .... One By Mouth Daily 5)  Diovan 80 Mg Tabs (Valsartan) .Marland Kitchen.. 1 By Mouth Daily 6)  Fish Oil   Oil (Fish Oil) 7)  Pravastatin Sodium 40 Mg Tabs (Pravastatin Sodium) .... Once Daily 8)  Nitrostat 0.4 Mg Subl (Nitroglycerin) .Marland Kitchen.. 1 Sl As Needed As Dirr 9)  Symbicort 160-4.5 Mcg/act  Aero (Budesonide-Formoterol Fumarate) .... 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 Smoked x 40+ years 1 ppd.   Regular exercise-no, golf  Risk Factors: Exercise: no (01/12/2008)  Risk Factors: Smoking Status: quit (01/12/2008)  Past Medical History: COPD/Asthma > PFTs 08/10/2009: Fev1 1.43L/50%, 230cc/16% BD response, TLC 1105, RV 117%, DLCO 10.5/55% Coronary artery disease(status post stenting to    the right coronary artery with subsequent CABG.  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 Smoked  x 40+ years 1 ppd.   Regular exercise-no, golf  Review of Systems       The patient complains of shortness of breath with activity and productive cough.  The patient denies shortness of breath at rest, non-productive cough, coughing up blood, chest pain, irregular heartbeats, acid heartburn, indigestion, loss of appetite, weight change, abdominal pain, difficulty swallowing, sore throat,  tooth/dental problems, headaches, nasal congestion/difficulty breathing through nose, sneezing, itching, ear ache, anxiety, depression, hand/feet swelling, joint stiffness or pain, rash, change in color of mucus, and fever.    Vital Signs:  Patient profile:   70 year old male Height:      68 inches Weight:      155.50 pounds O2 Sat:      96 % on Room air Temp:     97.5 degrees F oral Pulse rate:   58 / minute BP sitting:   122 / 70  (right arm) Cuff size:   regular  Vitals Entered By: Carron Curie CMA (February 13, 2010 11:37 AM)  O2 Flow:  Room air CC: Pulmonary Consult for SOB x less than 1 year.  Comments Medications reviewed with patient Daytime phone number verified with patient. Carron Curie CMA  February 13, 2010 11:39 AM  Ambulatory Pulse Oximetry  Resting; HR_61____    02 Sat_95____  Lap1 (185 feet)   HR_74____   02 Sat_95____ Lap2 (185 feet)   HR_94____   02 Sat_94____    Lap3 (185 feet)   HR_____   02 Sat_____  ___Test Completed without Difficulty _X__Test Stopped due UJ:[id number] droped to 86% R/A........Marland KitchenAfter approx. 2 minutes, before I could apply oxygen to aide in recovery he recovered to 95 % R/A Denna Haggard, CMA  February 13, 2010 11:56 AM       Physical Exam  General:  well developed, well nourished, in no acute distress Head:  normocephalic and atraumatic Eyes:  PERRLA/EOM intact; conjunctiva and sclera clear Ears:  TMs intact and clear with normal canals Nose:  no deformity, discharge, inflammation, or lesions Mouth:  no deformity or lesions Neck:  no masses, thyromegaly, or abnormal cervical nodes Chest Wall:  cabg scar + Lungs:  clear bilaterally to auscultation and percussion Heart:  regular rate and rhythm, S1, S2 without murmurs, rubs, gallops, or clicks Abdomen:  bowel sounds positive; abdomen soft and non-tender without masses, or organomegaly Msk:  no deformity or scoliosis noted with normal posture Pulses:  pulses  normal Extremities:  no clubbing, cyanosis, edema, or deformity noted Neurologic:  CN II-XII grossly intact with normal reflexes, coordination, muscle strength and tone Skin:  intact without lesions or rashes Cervical Nodes:  no significant adenopathy Axillary Nodes:  no significant adenopathy Psych:  alert and cooperative; normal mood and affect; normal attention span and concentration   CXR  Procedure date:  08/01/2009  Findings:      CHEST - 2 VIEW    Comparison: 01/12/2008    Findings: Prior CABG.  Heart size normal.  Changes of COPD again   noted without active disease.  No pleural fluid or osseous lesions.    IMPRESSION:   Prior CABG and COPD - no active cardiopulmonary disease.    Read By:  Bernerd Limbo,  M.D.   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 Report  Procedure date:  08/10/2009  Findings:      >  PFTs 08/10/2009: Fev1 1.43L/50%, 230cc/16% BD response, TLC 1105, RV 117%, DLCO 10.5/55%  Comments:      independently reviewed  Impression & Recommendations:  Problem # 1:  SHORTNESS OF BREATH (SOB) (ICD-786.05) Assessment New  I think Gold stage 2-3 COPD with associated likely asthma is contributing to dyspnea. HE desaturated after walking 370 feet in the office. We need to optimize COPD Rx first. WE disucssed cost and side effects of spiriva. Will give some samples and symbicort discount card. Will start spiriva one puff daily. Have advised compliance iwth symbicort 2 puff two times a day at the higher dose. We will review his spiro and walking desaturation test in 1 month. If he still desaturates will start o2 (currently he is reluctant to start o2). Also, he might be decondtioned. So, I have refrrred him to [facility]. He is willing to participate. 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

## 2010-12-05 NOTE — Progress Notes (Signed)
       New/Updated Medications: OMEPRAZOLE 20 MG CPDR (OMEPRAZOLE) 1 tab once a day Prescriptions: OMEPRAZOLE 20 MG CPDR (OMEPRAZOLE) 1 tab once a day  #90 x 3   Entered by:   Ami Bullins CMA   Authorized by:   Tresa Garter MD   Signed by:   Bill Salinas CMA on 03/11/2010   Method used:   Faxed to ...       [facility]* (mail-order)             ,          Ph: [phone]       Fax: [phone]   RxID:   [id number]

## 2010-12-05 NOTE — Miscellaneous (Signed)
Summary: Oxygen Device Eval/[facility]  Oxygen Device Eval/[facility]   Imported By: Sherian Rein 04/08/2010 12:09:30  _____________________________________________________________________  External Attachment:    Type:   Image     Comment:   External Document

## 2010-12-05 NOTE — Progress Notes (Signed)
Summary: prescript  Phone Note Call from Patient   Caller: Patient Call For: Johnattan Strassman Summary of Call: pt was given sample of spiriva want to know if he was suppose to have a prescript? [facility] Initial call taken by: Rickard Patience,  February 13, 2010 2:00 PM  Follow-up for Phone Call        rx sent. pt aware. Carron Curie CMA  February 13, 2010 4:19 PM     New/Updated Medications: SPIRIVA HANDIHALER 18 MCG CAPS (TIOTROPIUM BROMIDE MONOHYDRATE) once daily Prescriptions: SPIRIVA HANDIHALER 18 MCG CAPS (TIOTROPIUM BROMIDE MONOHYDRATE) once daily  #30 x 0   Entered by:   Carron Curie CMA   Authorized by:   Kalman Shan MD   Signed by:   Carron Curie CMA on 02/13/2010   Method used:   [facility]  [place] [address]       [address] [place] [address]       [place], [place]  [address]       Ph: [phone] or [phone]       Fax: [phone]   RxID:   [id number]

## 2010-12-05 NOTE — Assessment & Plan Note (Signed)
Summary: rov per pt call/lg      Allergies Added:   Visit Type:  Follow-up Primary Provider:  Tresa Garter MD  CC:  CAD and Dyspea.  History of Present Illness: The patient presents for yearly followup of his coronary disease. It has actually been over a year. Since I last saw him he has had increased dyspnea. He says he walks 50 yards in his yard and gets short of breath. He has to stop before he can continue. He thinks this is slowly getting worse. He is able to walk on a treadmill without the same amount of dyspnea. He doesn't report any resting shortness of breath and has no PND or orthopnea. He does feel like he has a cough and maybe something draining. He coughs up phlegm. He does not have chest pressure, neck or arm discomfort. He does not notice palpitations, presyncope or syncope. Of note he did have a stress perfusion study in November demonstrating an EF of 69% with no evidence of ischemia or infarct. He also had pulmonary function testing. I reviewed these as well aerated demonstrates severe lung disease. He was put on systemic or Symbicort but has not noticed any improvement.  Current Medications (verified): 1)  Toprol Xl 25 Mg Tb24 (Metoprolol Succinate) .Marland Kitchen.. 1 By Mouth Daily 2)  Aspirin 325 Mg Tabs (Aspirin) .... Take 1 Tab By Mouth Every Day 3)  Terazosin Hcl 2 Mg  Caps (Terazosin Hcl) .Marland Kitchen.. 1 By Mouth At Bedtime 4)  Prilosec Otc 20 Mg Tbec (Omeprazole Magnesium) .... One By Mouth Daily 5)  Diovan 80 Mg Tabs (Valsartan) .Marland Kitchen.. 1 By Mouth Daily 6)  Fish Oil   Oil (Fish Oil) 7)  Pravastatin Sodium 40 Mg Tabs (Pravastatin Sodium) .... Once Daily 8)  Nitrostat 0.4 Mg Subl (Nitroglycerin) .Marland Kitchen.. 1 Sl As Needed As Dirr 9)  Symbicort 160-4.5 Mcg/act  Aero (Budesonide-Formoterol Fumarate) .... 2 Inh Bid  Allergies (verified): 1)  Crestor  Past History:  Past Medical History: COPD Coronary artery disease(status post stenting to    the right coronary artery with subsequent  CABG.  He had LIMA to the LAD,    SVG to diagonal, SVG to obtuse marginal, and SVG to PDA ),  Hyperlipidemia Osteoarthritis GERD Low back pain  Review of Systems       As stated in the HPI and negative for all other systems.   Vital Signs:  Patient profile:   70 year old male Height:      68 inches Weight:      154 pounds BMI:     23.50 Pulse rate:   58 / minute Resp:     16 per minute BP sitting:   152 / 80  (right arm)  Vitals Entered By: Marrion Coy, CNA (January 30, 2010 3:12 PM)  Physical Exam  General:  Well developed, well nourished, in no acute distress. Head:  normocephalic and atraumatic Eyes:  PERRLA/EOM intact; conjunctiva and lids normal. Mouth:  Dentures, gums and palate normal. Oral mucosa normal. Neck:  Neck supple, no JVD. No masses, thyromegaly or abnormal cervical nodes. Chest Wall:  Well-healed sternotomy scar Lungs:  decreased breath sounds bilaterally, no crackles or wheezing Abdomen:  Bowel sounds positive; abdomen soft and non-tender without masses, organomegaly, or hernias noted. No hepatosplenomegaly. Msk:  Back normal, normal gait. Muscle strength and tone normal. Extremities:  No clubbing or cyanosis. Neurologic:  Alert and oriented x 3. Skin:  Intact without lesions or rashes. Cervical Nodes:  no  significant adenopathy Axillary Nodes:  no significant adenopathy Inguinal Nodes:  no significant adenopathy Psych:  Normal affect.   Detailed Cardiovascular Exam  Neck    Carotids: Carotids full and equal bilaterally without bruits.      Neck Veins: Normal, no JVD.    Heart    Inspection: no deformities or lifts noted.      Palpation: normal PMI with no thrills palpable.      Auscultation: regular rate and rhythm, S1, S2 without murmurs, rubs, gallops, or clicks.    Vascular    Abdominal Aorta: no palpable masses, pulsations, or audible bruits.      Femoral Pulses: normal femoral pulses bilaterally.      Pedal Pulses: normal pedal pulses  bilaterally.      Radial Pulses: normal radial pulses bilaterally.      Peripheral Circulation: no clubbing, cyanosis, or edema noted with normal capillary refill.     EKG  Procedure date:  01/30/2010  Findings:      sinus rhythm, rate 58, axis within normal limits, intervals within normal limits, no acute ST-T wave changes.  Impression & Recommendations:  Problem # 1:  DYSPNEA (ICD-786.09) The most significant complaint has been dyspnea. He had a negative stress perfusion study. He had markedly abnormal pulmonary function testing. I have taken the liberty of referring him to a pulmonologist. Of note the patient requested a chest CT to rule out cancer. There was no evidence of this apparently on chest x-ray and I deferred this question to the pulmonologist.  Problem # 2:  CORONARY ARTERY DISEASE (ICD-414.00) He will continue with risk reduction. He had stress testing as described. Orders: EKG w/ Interpretation (93000)  Problem # 3:  HYPERLIPIDEMIA (ICD-272.4) I reviewed his lipids from March of last year with an LDL of 87 and HDL of 42.5. He'll continue on the meds as listed and we will repeat this soon.  Other Orders: Pulmonary Referral (Pulmonary)  Patient Instructions: 1)  Your physician recommends that you schedule a follow-up appointment in:  1 year with Dr Antoine Poche 2)  Your physician recommends that you return for a FASTING lipid and liver profile: 272.2 v58.69 3)  Your physician recommends that you continue on your current medications as directed. Please refer to the Current Medication list given to you today.

## 2010-12-05 NOTE — Miscellaneous (Signed)
Summary: [facility]  [facility]   Imported By: Sherian Rein 04/08/2010 12:12:01  _____________________________________________________________________  External Attachment:    Type:   Image     Comment:   External Document

## 2010-12-11 NOTE — Miscellaneous (Signed)
Summary: Orders Update  Clinical Lists Changes  Orders: Added new Test order of Carotid Duplex (Carotid Duplex) - Signed 

## 2010-12-18 ENCOUNTER — Ambulatory Visit (INDEPENDENT_AMBULATORY_CARE_PROVIDER_SITE_OTHER)
Admission: RE | Admit: 2010-12-18 | Discharge: 2010-12-18 | Disposition: A | Discharge status: Home / Self Care | Payer: Medicare Other | Source: Ambulatory Visit | Attending: Internal Medicine | Admitting: Internal Medicine

## 2010-12-18 ENCOUNTER — Ambulatory Visit (INDEPENDENT_AMBULATORY_CARE_PROVIDER_SITE_OTHER): Payer: Medicare Other | Admitting: Internal Medicine

## 2010-12-18 ENCOUNTER — Encounter: Payer: Self-pay | Admitting: Internal Medicine

## 2010-12-18 ENCOUNTER — Other Ambulatory Visit: Payer: Self-pay | Admitting: Internal Medicine

## 2010-12-18 DIAGNOSIS — M542 Cervicalgia: Secondary | ICD-10-CM

## 2010-12-18 DIAGNOSIS — M546 Pain in thoracic spine: Secondary | ICD-10-CM

## 2010-12-19 ENCOUNTER — Telehealth: Payer: Self-pay | Admitting: Internal Medicine

## 2010-12-25 NOTE — Progress Notes (Signed)
Summary: XRay results  Phone Note Outgoing Call Call back at [facility] Phone [phone]   Call placed by: Brenton Grills CMA Duncan Dull),  December 19, 2010 1:35 PM Call placed to: Patient Summary of Call: Cervical XRay results-per Dr. Garfield Cornea change in lower neck on xray-contributing to pain in shoulders and back-no changes in med tx-F/U with Neurosurgeon as discussed at [facility]  Follow-up for Phone Call        left message for pt to callback office Follow-up by: Brenton Grills CMA Duncan Dull),  December 19, 2010 1:41 PM  Additional Follow-up for Phone Call Additional follow up Details #1::        pt informed of results and MD's advisement Additional Follow-up by: Brenton Grills CMA Duncan Dull),  December 19, 2010 3:25 PM

## 2010-12-25 NOTE — Assessment & Plan Note (Signed)
Summary: back pain, right arm pain---dr plot pt/plot off   Vital Signs:  Patient profile:   70 year old male Height:      68 inches (172.72 cm) Weight:      149.12 pounds (67.78 kg) Temp:     97.4 degrees F (36.33 degrees C) oral Pulse rate:   62 / minute BP sitting:   124 / 82  (left arm) Cuff size:   regular  Vitals Entered By: Orlan Leavens RMA (December 18, 2010 1:46 PM) CC: Back & (R) arm pain Is Patient Diabetic? No Pain Assessment Patient in pain? yes     Location: back & (R) arm Type: aching   Primary Care Provider:  Tresa Garter MD - PMD, Dr. Antoine Poche - [place], Dr. Marchelle Gearing - [place], Dr Marina Goodell - GI., Dr Earlene Plater - Urology  CC:  Back & (R) arm pain.  History of Present Illness: saw PCP 11/22/10 for same: C/o LBP  x 6-8 months. Walking, standing, working in the yard, other  work - all make it worse. No irrad. It is 6/10 in intensity and located in the lower thor spine. No injury. No sudden onset T spine xray 11/2010 - no DDD or other abn to explain pain  now reports pain in R shoulder blade and right arm - pain not improved with trial tramadol or nortriptyline radiates from neck into mid back and shoulders- no injury or trauma hx, no fever, no falls or balance problems - no weakness in hands or grip gradual onset but progressively worse   also describes separate "burning tenderness" type of pain under each axilla - not related to pain above   Current Medications (verified): 1)  Toprol Xl 25 Mg Tb24 (Metoprolol Succinate) .Marland Kitchen.. 1 By Mouth Daily 2)  Aspirin 325 Mg Tabs (Aspirin) .... Take 1 Tab By Mouth Every Day 3)  Terazosin Hcl 5 Mg Caps (Terazosin Hcl) .Marland Kitchen.. 1 By Mouth At Bedtime 4)  Omeprazole 20 Mg Cpdr (Omeprazole) .Marland Kitchen.. 1 Tab Once A Day 5)  Diovan 80 Mg Tabs (Valsartan) .Marland Kitchen.. 1 By Mouth Daily 6)  Fish Oil   Oil (Fish Oil) 7)  Pravastatin Sodium 40 Mg Tabs (Pravastatin Sodium) .... Once Daily 8)  Nitrostat 0.4 Mg Subl (Nitroglycerin) .Marland Kitchen.. 1 Sl As Needed As  Dirr 9)  Symbicort 160-4.5 Mcg/act  Aero (Budesonide-Formoterol Fumarate) .... 2 Inh Bid 10)  Tramadol Hcl 50 Mg Tabs (Tramadol Hcl) .Marland Kitchen.. 1-2 Tabs By Mouth Two Times A Day As Needed Pain 11)  Nortriptyline Hcl 10 Mg Caps (Nortriptyline Hcl) .Marland Kitchen.. 1-2 By Mouth Qhs  Allergies (verified): 1)  Crestor  Past History:  Past Medical History: #COPD/Asthma > PFTs 08/10/2009: Fev1 1.43L/50%, 230cc/16% BD response, TLC 1105, RV 117%, DLCO 10.5/55% > Cleda Daub 03/18/2010: Fev1 3.12/102%, FVC 5.12/124% after spiriva and symbicort     #Exertional hypoxemia > 03/18/2010: desaturated to 84% after 185 feet  #Coronary artery disease(status post stenting to    the right coronary artery with subsequent CABG.  He had LIMA to the LAD,    SVG to diagonal, SVG to obtuse marginal, and SVG to PDA ),  #Hyperlipidemia #Osteoarthritis #GERD #Low back pain  Review of Systems  The patient denies fever, chest pain, syncope, abdominal pain, muscle weakness, suspicious skin lesions, and difficulty walking.    Physical Exam  General:  alert, well-developed, well-nourished, and cooperative to examination.   wife at side Head:  Normocephalic and atraumatic without obvious abnormalities. No apparent alopecia or balding. Lungs:  normal respiratory  effort, no intercostal retractions or use of accessory muscles; normal breath sounds bilaterally - no crackles and no wheezes.    Heart:  normal rate, regular rhythm, no murmur, and no rub. BLE without edema.  Msk:  tender myofascial spasm along R>L inner scapula region - nontender over vertebral palp - cervical spine with FROM but pain symptoms exac by rotation to right Neurologic:  alert & oriented X3 and cranial nerves II-XII symetrically intact.  strength normal in all extremities, sensation intact to light touch, and gait normal. speech fluent without dysarthria or aphasia; follows commands with good comprehension.    Impression & Recommendations:  Problem # 1:  NECK PAIN  (ICD-723.1) ?cervical ddd and radiculopathy causing pain symptoms - unimporved with tramadol trial T spine xray reviewed - no sig DDD check C-spine xray now and refer to nsurg to eval/tx same also use robaxin for spasm relief and consider NSAIDs ok to reduce dose statin by 1/2 until clarification re: cause of pain His updated medication list for this problem includes:    Aspirin 325 Mg Tabs (Aspirin) .Marland Kitchen... Take 1 tab by mouth every day    Tramadol Hcl 50 Mg Tabs (Tramadol hcl) .Marland Kitchen... 1-2 tabs by mouth two times a day as needed pain    Robaxin 500 Mg Tabs (Methocarbamol) .Marland Kitchen... 1 by mouth three times a day for muscle spasm  Orders: Prescription Created Electronically [id number]) T-Cervical Spine Comp 4 Views [phone]) Neurosurgeon Referral (Neurosurgeon)  Problem # 2:  BACK PAIN, THORACIC REGION (ICD-724.1)  His updated medication list for this problem includes:    Aspirin 325 Mg Tabs (Aspirin) .Marland Kitchen... Take 1 tab by mouth every day    Tramadol Hcl 50 Mg Tabs (Tramadol hcl) .Marland Kitchen... 1-2 tabs by mouth two times a day as needed pain    Robaxin 500 Mg Tabs (Methocarbamol) .Marland Kitchen... 1 by mouth three times a day for muscle spasm  Orders: Neurosurgeon Referral (Neurosurgeon)  seen 11/2010 by PCP for same- xray t spine unremarkable CT chest in Fall 2011 was reviewed  Complete Medication List: 1)  Toprol Xl 25 Mg Tb24 (Metoprolol succinate) .Marland Kitchen.. 1 by mouth daily 2)  Aspirin 325 Mg Tabs (Aspirin) .... Take 1 tab by mouth every day 3)  Terazosin Hcl 5 Mg Caps (Terazosin hcl) .Marland Kitchen.. 1 by mouth at bedtime 4)  Omeprazole 20 Mg Cpdr (Omeprazole) .Marland Kitchen.. 1 tab once a day 5)  Diovan 80 Mg Tabs (Valsartan) .Marland Kitchen.. 1 by mouth daily 6)  Fish Oil Oil (Fish oil) 7)  Pravastatin Sodium 40 Mg Tabs (Pravastatin sodium) .... 1/2 tab by mouth once daily 8)  Nitrostat 0.4 Mg Subl (Nitroglycerin) .Marland Kitchen.. 1 sl as needed as dirr 9)  Symbicort 160-4.5 Mcg/act Aero (Budesonide-formoterol fumarate) .... 2 inh bid 10)  Tramadol Hcl 50 Mg  Tabs (Tramadol hcl) .Marland Kitchen.. 1-2 tabs by mouth two times a day as needed pain 11)  Robaxin 500 Mg Tabs (Methocarbamol) .Marland Kitchen.. 1 by mouth three times a day for muscle spasm 12)  Gabapentin 300 Mg Caps (Gabapentin) .Marland Kitchen.. 1 by mouth at bedtime  Patient Instructions: 1)  it was good to see you today.  2)  stop nortriptyline 3)  use robaxin and gabapentin for pain symptoms - your prescriptions have been electronically submitted to your pharmacy. Please take as directed. Contact our office if you believe you're having problems with the medication(s).  4)  xray neck ordered today - your results will be called to you after review in 48-72 hours from the time of  test completion 5)  we'll make referral to neurosurgery for your pain. Our office will contact you regarding this appointment once made.  6)  Please schedule a follow-up appointment in 2-4 weeks with dr. Posey Rea, call sooner if problems.  Prescriptions: GABAPENTIN 300 MG CAPS (GABAPENTIN) 1 by mouth at bedtime  #30 x 3   Entered and Authorized by:   Newt Lukes MD   Signed by:   Newt Lukes MD on 12/18/2010   Method used:   Electron[facility] to        [facility]  [place] [address] (retail[facility]       [address]       [place], [place]  [address]       Ph: [phone] or [phone]       Fax: [phone]   RxID:   [id number] ROBAXIN 500 MG TABS (METHOCARBAMOL) 1 by mouth three times a day for muscle spasm  #40 x 1   Entered and Authorized by:   Newt Lukes MD   Signed by:   Newt Lukes MD on 12/18/2010   Method used:   [facility]        [facility]  [place] [address] (retail[facility]       [address]       [place], [place]  [address]       Ph: [phone] or [phone]       Fax: [phone]   RxID:   [id number]    Orders Added: 1)  Est. Patient Level IV [57322] 2)  Prescription Created Electronically [[id number]] 3)  T-Cervical Spine Comp 4 Views [[id number]] 4)  Neurosurgeon Referral [Neurosurgeon]

## 2010-12-30 ENCOUNTER — Encounter: Payer: Self-pay | Admitting: Internal Medicine

## 2010-12-31 ENCOUNTER — Ambulatory Visit: Payer: Self-pay | Admitting: Internal Medicine

## 2011-01-09 NOTE — Consult Note (Signed)
Summary: Letta Kocher MD/[facility]  Letta Kocher MD/[facility]   Imported By: Lester [place] 01/02/2011 07:54:00  _____________________________________________________________________  External Attachment:    Type:   Image     Comment:   External Document

## 2011-01-15 ENCOUNTER — Ambulatory Visit (INDEPENDENT_AMBULATORY_CARE_PROVIDER_SITE_OTHER): Payer: Medicare Other | Admitting: Internal Medicine

## 2011-01-15 ENCOUNTER — Encounter: Payer: Self-pay | Admitting: Internal Medicine

## 2011-01-15 DIAGNOSIS — R5383 Other fatigue: Secondary | ICD-10-CM

## 2011-01-15 DIAGNOSIS — R5381 Other malaise: Secondary | ICD-10-CM

## 2011-01-15 DIAGNOSIS — R110 Nausea: Secondary | ICD-10-CM | POA: Insufficient documentation

## 2011-01-15 DIAGNOSIS — M545 Low back pain, unspecified: Secondary | ICD-10-CM

## 2011-01-15 DIAGNOSIS — J4489 Other specified chronic obstructive pulmonary disease: Secondary | ICD-10-CM

## 2011-01-15 DIAGNOSIS — J449 Chronic obstructive pulmonary disease, unspecified: Secondary | ICD-10-CM

## 2011-01-17 LAB — CBC
HCT: 40.8 % (ref 39.0–52.0)
Hemoglobin: 13.7 g/dL (ref 13.0–17.0)
MCHC: 33.6 g/dL (ref 30.0–36.0)
Platelets: 200 10*3/uL (ref 150–400)
RBC: 4.19 MIL/uL — ABNORMAL LOW (ref 4.22–5.81)
RDW: 13.3 % (ref 11.5–15.5)
WBC: 5.9 10*3/uL (ref 4.0–10.5)

## 2011-01-17 LAB — COMPREHENSIVE METABOLIC PANEL
ALT: 16 U/L (ref 0–53)
AST: 20 U/L (ref 0–37)
Chloride: 109 mEq/L (ref 96–112)
Creatinine, Ser: 1.35 mg/dL (ref 0.4–1.5)
GFR calc Af Amer: >60 mL/min (ref >60)
GFR calc non Af Amer: 53 mL/min — ABNORMAL LOW (ref >60)
Glucose, Bld: 123 mg/dL — ABNORMAL HIGH (ref 70–99)
Potassium: 4.1 mEq/L (ref 3.5–5.1)
Sodium: 140 mEq/L (ref 135–145)

## 2011-01-17 LAB — URINE MICROSCOPIC-ADD ON

## 2011-01-17 LAB — URINALYSIS, ROUTINE W REFLEX MICROSCOPIC
Glucose, UA: NEGATIVE mg/dL
Ketones, ur: NEGATIVE mg/dL
Protein, ur: NEGATIVE mg/dL
pH: 5 (ref 5.0–8.0)

## 2011-01-17 LAB — DIFFERENTIAL
Basophils Absolute: 0 10*3/uL (ref 0.0–0.1)
Eosinophils Absolute: 0.1 10*3/uL (ref 0.0–0.7)
Monocytes Absolute: 0.5 10*3/uL (ref 0.1–1.0)
Monocytes Relative: 8 % (ref 3–12)

## 2011-01-17 LAB — POCT I-STAT, CHEM 8
Calcium, Ion: 1.18 mmol/L (ref 1.12–1.32)
HCT: 44 % (ref 39.0–52.0)
Hemoglobin: 15 g/dL (ref 13.0–17.0)

## 2011-01-21 NOTE — Assessment & Plan Note (Signed)
Summary: 2-4 WK FU/NWS   Vital Signs:  Patient profile:   70 year old male Height:      68 inches Weight:      147 pounds BMI:     22.43 Temp:     97.3 degrees F oral Pulse rate:   76 / minute Pulse rhythm:   regular Resp:     16 per minute BP sitting:   100 / 68  (left arm) Cuff size:   regular  Vitals Entered By: Lanier Prude, CMA(AAMA) (January 15, 2011 1:45 PM) CC: 4 wk f/u on back pain Is Patient Diabetic? No Comments pt is not taking Fish Oil   Primary Care Provider:  Tresa Garter MD - PMD, Dr. Antoine Poche - [place], Dr. Marchelle Gearing - [place], Dr Marina Goodell - GI., Dr Earlene Plater - Urology  CC:  4 wk f/u on back pain.  History of Present Illness: The patient presents for a follow up of hypertension, COPD, hyperlipidemia C/o nausea x 1 wk worse with coffee and better w/food   Current Medications (verified): 1)  Toprol Xl 25 Mg Tb24 (Metoprolol Succinate) .Marland Kitchen.. 1 By Mouth Daily 2)  Aspirin 325 Mg Tabs (Aspirin) .... Take 1 Tab By Mouth Every Day 3)  Terazosin Hcl 5 Mg Caps (Terazosin Hcl) .Marland Kitchen.. 1 By Mouth At Bedtime 4)  Omeprazole 20 Mg Cpdr (Omeprazole) .Marland Kitchen.. 1 Tab Once A Day 5)  Diovan 80 Mg Tabs (Valsartan) .Marland Kitchen.. 1 By Mouth Daily 6)  Fish Oil   Oil (Fish Oil) 7)  Pravastatin Sodium 40 Mg Tabs (Pravastatin Sodium) .Marland Kitchen.. 1  Tab By Mouth Once Daily 8)  Nitrostat 0.4 Mg Subl (Nitroglycerin) .Marland Kitchen.. 1 Sl As Needed As Dirr 9)  Symbicort 160-4.5 Mcg/act  Aero (Budesonide-Formoterol Fumarate) .... 2 Inh Bid 10)  Tramadol Hcl 50 Mg Tabs (Tramadol Hcl) .Marland Kitchen.. 1-2 Tabs By Mouth Two Times A Day As Needed Pain 11)  Robaxin 500 Mg Tabs (Methocarbamol) .Marland Kitchen.. 1 By Mouth Three Times A Day For Muscle Spasm 12)  Gabapentin 300 Mg Caps (Gabapentin) .Marland Kitchen.. 1 By Mouth At Bedtime 13)  Celebrex 200 Mg Caps (Celecoxib) .Marland Kitchen.. 1 By Mouth Once Daily  Allergies (verified): 1)  Crestor  Past History:  Past Medical History: Last updated: 12/18/2010 #COPD/Asthma > PFTs 08/10/2009: Fev1 1.43L/50%, 230cc/16% BD  response, TLC 1105, RV 117%, DLCO 10.5/55% > Cleda Daub 03/18/2010: Fev1 3.12/102%, FVC 5.12/124% after spiriva and symbicort     #Exertional hypoxemia > 03/18/2010: desaturated to 84% after 185 feet  #Coronary artery disease(status post stenting to    the right coronary artery with subsequent CABG.  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 Quit in 2005. Smoked x 40+ years 1 ppd.   Regular exercise-no, golf  Review of Systems       The patient complains of dyspnea on exertion and prolonged cough.  The patient denies fever, chest pain, and abdominal pain.    Physical Exam  General:  alert, well-developed, well-nourished, and cooperative to examination.   wife at side Nose:  WNL Mouth:  Erythematous throat mucosa and intranasal erythema.  Neck:   B bruit Lungs:  normal respiratory effort, no intercostal retractions or use of accessory muscles; normal breath sounds bilaterally - no crackles and no wheezes.    Heart:  normal rate, regular rhythm, no  murmur, and no rub. BLE without edema.  Abdomen:  Bowel sounds positive,abdomen soft and non-tender without masses, organomegaly or hernias noted. Msk:  tender myofascial spasm along R>L inner scapula region - nontender over vertebral palp - cervical spine with FROM but pain symptoms exac by rotation to right Neurologic:  alert & oriented X3 and cranial nerves II-XII symetrically intact.  strength normal in all extremities, sensation intact to light touch, and gait normal. speech fluent without dysarthria or aphasia; follows commands with good comprehension.  Skin:  AKs on face/nose - multiple Psych:  Cognition and judgment appear intact. Alert and cooperative with normal attention span and concentration. No apparent delusions, illusions,  hallucinations. Denies being depressed   Impression & Recommendations:  Problem # 1:  NAUSEA (ICD-787.02) Assessment New Stop Celebrex  Problem # 2:  BACK PAIN, THORACIC REGION (ICD-724.1) Start PT He declined trigger point injections F/u w/Ortho is pending  S/p ortho eval His updated medication list for this problem includes:    Aspirin 325 Mg Tabs (Aspirin) .Marland Kitchen... Take 1 tab by mouth every day    Tramadol Hcl 50 Mg Tabs (Tramadol hcl) .Marland Kitchen... 1-2 tabs by mouth two times a day as needed pain    Robaxin 500 Mg Tabs (Methocarbamol) .Marland Kitchen... 1 by mouth three times a day for muscle spasm    Celebrex 200 Mg Caps (Celecoxib) .Marland Kitchen... 1 by mouth once daily  Problem # 3:  INSOMNIA, CHRONIC (ICD-307.42) Assessment: Improved On the regimen of medicine(s) reflected in the chart    Problem # 4:  C O P D (ICD-496) Assessment: Unchanged  His updated medication list for this problem includes:    Symbicort 160-4.5 Mcg/act Aero (Budesonide-formoterol fumarate) .Marland Kitchen... 2 inh bid  Problem # 5:  DYSPNEA (ICD-786.09) Assessment: Unchanged  His updated medication list for this problem includes:    Toprol Xl 25 Mg Tb24 (Metoprolol succinate) .Marland Kitchen... 1 by mouth daily    Symbicort 160-4.5 Mcg/act Aero (Budesonide-formoterol fumarate) .Marland Kitchen... 2 inh bid  Complete Medication List: 1)  Toprol Xl 25 Mg Tb24 (Metoprolol succinate) .Marland Kitchen.. 1 by mouth daily 2)  Aspirin 325 Mg Tabs (Aspirin) .... Take 1 tab by mouth every day 3)  Terazosin Hcl 5 Mg Caps (Terazosin hcl) .Marland Kitchen.. 1 by mouth at bedtime 4)  Omeprazole 20 Mg Cpdr (Omeprazole) .Marland Kitchen.. 1 tab once a day 5)  Diovan 80 Mg Tabs (Valsartan) .Marland Kitchen.. 1 by mouth daily 6)  Fish Oil Oil (Fish oil) 7)  Pravastatin Sodium 40 Mg Tabs (Pravastatin sodium) .Marland Kitchen.. 1  tab by mouth once daily 8)  Nitrostat 0.4 Mg Subl (Nitroglycerin) .Marland Kitchen.. 1 sl as needed as dirr 9)  Symbicort 160-4.5 Mcg/act Aero (Budesonide-formoterol fumarate) .... 2 inh bid 10)  Tramadol Hcl 50 Mg Tabs (Tramadol hcl)  .Marland Kitchen.. 1-2 tabs by mouth two times a day as needed pain 11)  Robaxin 500 Mg Tabs (Methocarbamol) .Marland Kitchen.. 1 by mouth three times a day for muscle spasm 12)  Celebrex 200 Mg Caps (Celecoxib) .Marland Kitchen.. 1 by mouth once daily 13)  Promethazine Hcl 25 Mg Tabs (Promethazine hcl) .Marland Kitchen.. 1-2 by mouth four times a day as needed nausea  Patient Instructions: 1)  Stop Celebrex x 1-2 wks - see if better 2)  Call if you are not better in a reasonable amount of time or if worse.  3)  Please schedule a follow-up appointment in 2 months. Prescriptions: PROMETHAZINE HCL 25 MG TABS (PROMETHAZINE HCL) 1-2 by mouth four times a day as needed nausea  #60 x  1   Entered and Authorized by:   Tresa Garter MD   Signed by:   Tresa Garter MD on 01/15/2011   Method used:   [facility]        [facility]  [place] [address] [facility]       [address]       [place], [place]  [address]       Ph: [phone] or [phone]       Fax: [phone]   RxID:   [id number]    Orders Added: 1)  Est. Patient Level IV [34742]

## 2011-02-17 ENCOUNTER — Other Ambulatory Visit: Payer: Self-pay | Admitting: Internal Medicine

## 2011-03-13 ENCOUNTER — Encounter: Payer: Self-pay | Admitting: Cardiology

## 2011-03-14 ENCOUNTER — Encounter: Payer: Self-pay | Admitting: Cardiology

## 2011-03-14 ENCOUNTER — Ambulatory Visit (INDEPENDENT_AMBULATORY_CARE_PROVIDER_SITE_OTHER): Payer: Medicare Other | Admitting: Cardiology

## 2011-03-14 DIAGNOSIS — R0602 Shortness of breath: Secondary | ICD-10-CM

## 2011-03-14 DIAGNOSIS — E785 Hyperlipidemia, unspecified: Secondary | ICD-10-CM

## 2011-03-14 DIAGNOSIS — I2510 Atherosclerotic heart disease of native coronary artery without angina pectoris: Secondary | ICD-10-CM

## 2011-03-14 MED ORDER — ASPIRIN EC 81 MG PO TBEC: 81.0000 mg | DELAYED_RELEASE_TABLET | Freq: Every day | ORAL | Status: AC

## 2011-03-14 NOTE — Assessment & Plan Note (Signed)
Patient has no new symptoms. He will continue with risk reduction. No further testing is indicated.

## 2011-03-14 NOTE — Assessment & Plan Note (Signed)
He will come back for a lipid profile and I will check accordingly.

## 2011-03-14 NOTE — Progress Notes (Signed)
HPI The patient returned for one year followup. Since I last saw him he has had no new cardiovascular complaints. He had a stress test in the fall of 2010 which was negative. He has had dyspnea and was seen by pulmonary and was found initially to have decreased saturations with ambulation. However, he completed pulmonary rehabilitation with no oxygen desaturations. He says he still gets dyspneic when he is walking and carrying objects such as groceries. However, with usual activities he is not describing any new shortness of breath, PND or orthopnea. He has not been having any palpitations, chest discomfort, neck or arm discomfort. He might get some mild orthostatic symptoms or dizziness when he's walking the golf course.  Allergies  Allergen Reactions  . Rosuvastatin     REACTION: aches    Current Outpatient Prescriptions  Medication Sig Dispense Refill  . aspirin 325 MG tablet Take 325 mg by mouth daily.        . budesonide-formoterol (SYMBICORT) 160-4.5 MCG/ACT inhaler Inhale 2 puffs into the lungs 2 (two) times daily.        . metoprolol succinate (TOPROL-XL) 25 MG 24 hr tablet Take 12.5 mg by mouth daily.       . nitroGLYCERIN (NITROSTAT) 0.4 MG SL tablet Place 0.4 mg under the tongue every 5 (five) minutes as needed.        Marland Kitchen omeprazole (PRILOSEC) 20 MG capsule TAKE 1 CAPSULE DAILY  90 capsule  2  . pravastatin (PRAVACHOL) 40 MG tablet Take 40 mg by mouth daily.        . promethazine (PHENERGAN) 25 MG tablet Take 25 mg by mouth every 6 (six) hours as needed.        . terazosin (HYTRIN) 5 MG capsule Take 5 mg by mouth at bedtime.        . traMADol (ULTRAM) 50 MG tablet Take 50 mg by mouth every 6 (six) hours as needed.        . valsartan (DIOVAN) 80 MG tablet Take 80 mg by mouth daily.        Marland Kitchen DISCONTD: celecoxib (CELEBREX) 200 MG capsule Take 200 mg by mouth daily.        Marland Kitchen DISCONTD: fish oil-omega-3 fatty acids 1000 MG capsule Take 2 g by mouth daily.        Marland Kitchen DISCONTD: methocarbamol  (ROBAXIN) 500 MG tablet Take 500 mg by mouth 3 (three) times daily.          Past Medical History  Diagnosis Date  . COPD (chronic obstructive pulmonary disease)   . Asthma   . Coronary artery disease     s/p stenting to the right coronary artery with subsequent CABG.  He has LIMA to the LAD,SVG to diagonal, SVG to obtuse marginal, and SVG to PDA,  . Hyperlipidemia   . Osteoarthritis   . GERD (gastroesophageal reflux disease)   . Low back pain     Past Surgical History  Procedure Date  . Coronary artery bypass graft 2006  . Appendectomy   . Hemorrhoid surgery     ROS:  Kidney stone, back pain.  Otherwise as stated in the history of present illness negative roll of systems.  PHYSICAL EXAM BP 110/73  Pulse 57  Ht 5\' 8"  (1.727 m)  Wt 145 lb (65.772 kg)  BMI 22.05 kg/m2 GENERAL:  Well appearing HEENT:  Pupils equal round and reactive, fundi not visualized, oral mucosa unremarkable, dentures NECK:  No jugular venous distention, waveform within normal limits,  carotid upstroke brisk and symmetric, no bruits, no thyromegaly LYMPHATICS:  No cervical, inguinal adenopathy LUNGS:  Clear to auscultation bilaterally BACK:  No CVA tenderness CHEST:  Well healed sternotomy scar. HEART:  PMI not displaced or sustained,S1 and S2 within normal limits, no S3, no S4, no clicks, no rubs, no murmurs ABD:  Flat, positive bowel sounds normal in frequency in pitch, no bruits, no rebound, no guarding, no midline pulsatile mass, no hepatomegaly, no splenomegaly EXT:  2 plus pulses throughout, no edema, no cyanosis no clubbing SKIN:  No rashes no nodules NEURO:  Cranial nerves II through XII grossly intact, motor grossly intact throughout PSYCH:  Cognitively intact, oriented to person place and time   ASSESSMENT AND PLAN

## 2011-03-14 NOTE — Patient Instructions (Signed)
Please have a fasting lipid profile drawn Continue current medications Follow up in 1 year with Dr Antoine Poche

## 2011-03-14 NOTE — Assessment & Plan Note (Signed)
This is at baseline and he has had an extensive evaluation. No change in therapy is indicated.

## 2011-03-17 ENCOUNTER — Other Ambulatory Visit (INDEPENDENT_AMBULATORY_CARE_PROVIDER_SITE_OTHER): Payer: Medicare Other | Admitting: *Deleted

## 2011-03-17 DIAGNOSIS — E785 Hyperlipidemia, unspecified: Secondary | ICD-10-CM

## 2011-03-17 LAB — LIPID PANEL
Total CHOL/HDL Ratio: 3
Triglycerides: 76 mg/dL (ref 0.0–149.0)

## 2011-03-18 NOTE — Assessment & Plan Note (Signed)
[name]                            CARDIOLOGY OFFICE NOTE   NAME:Cameron [name], Cameron [name]                        MRN:          [id number]  DATE:10/16/2008                            DOB:          [birth year]    PRIMARY CARE PHYSICIAN:  Georgina Quint. Plotnikov, MD   REASON FOR PRESENTATION:  Evaluate the patient with coronary disease.   HISTORY OF PRESENT ILLNESS:  The patient is a pleasant 70 year old  gentleman.  At the last visit, he was fatigued.  I had him stop his  Toprol for a while.  However, he said he started getting tachy  palpitations.  He would walk across his living room and his heart rate  would be in the 90s.  He would go out for a short walk and his heart  rate would be in the 120s.  He, therefore, called up and spoke to [place],  and we restarted him on a quarter of 50 mg tablet, 12.5 mg once daily.  He said this has really worked.  His heart rate now stays in the 100  range when he is walking.  He is not having palpitations.  He is having  no presyncope or syncope.  He denies any chest pain.  He feels less  fatigued.  He says his dyspnea is improved.  He is not having any  resting shortness of breath.  Denies any PND or orthopnea.  He did stop  taking his pravastatin for a while because he thought it was causing  aches and pains in his ribcage.  However, it did not make a difference  and he is going to see Dr. Posey Rea about this.  He has not yet  restarted the pravastatin.   PAST MEDICAL HISTORY:  Coronary artery disease (status post stenting to  the right coronary artery with subsequent CABG.  He had a LIMA to the  LAD, SVG to diagonal, SVG to obtuse marginal, SVG to PDA),  gastroesophageal reflux disease, dyslipidemia, appendectomy, and  herniorrhaphy.   ALLERGIES:  None.   MEDICATIONS:  1. Aspirin 325 mg daily.  2. Prilosec.  3. Diovan 40 mg daily.  4. Pravastatin 40 mg daily.  5. Toprol 12.5 mg daily.  6. Terazosin 2 mg daily.  7.  Fish oil 1200 mg daily.   REVIEW OF SYSTEMS:  As stated in the HPI and otherwise negative for  other systems.   PHYSICAL EXAMINATION:  GENERAL:  The patient is in no distress.  VITAL SIGNS:  Blood pressure 127/80, heart rate 72 and regular, weight  148 pounds, body mass index 21.  NECK:  No jugular venous distention at 45 degrees.  Carotid upstroke  brisk and symmetrical.  No bruits, no thyromegaly.  LYMPHATICS:  No  cervical, axillary, or inguinal adenopathy.  LUNGS:  Clear to auscultation bilaterally.  BACK:  No costovertebral angle tenderness.  CHEST:  Unremarkable.  HEART:  PMI not displaced or sustained.  S1 and S2 within normal limits.  No S3, no S4.  No clicks, no rubs, no murmurs.  ABDOMEN:  Obese,  positive bowel sounds, normal in  frequency and pitch.  No bruits, no  rebound, no guarding.  No midline pulsatile mass.  No hepatomegaly, no  splenomegaly.  SKIN:  No rashes, no nodules.  EXTREMITIES:  2+ pulses throughout.  No edema, no cyanosis, no clubbing.  NEUROLOGIC:  Oriented to person, place, and time.  Cranial nerves II  through XII grossly intact.  Motor grossly intact.   EKG:  Sinus rhythm, rate 62, axis within normal limits, intervals within  normal limits, no acute ST-T wave changes.   ASSESSMENT AND PLAN:  1. Coronary artery disease:  The patient is having no new symptoms      related to this.  No further cardiovascular testing is suggested.      We will continue with risk reduction.  2. Dyslipidemia:  I have encouraged him to restart the pravastatin.      He should get a lipid profile in 8 weeks.  The goal should be an      LDL less than 100 and HDL greater than 40.  3. Followup:  We will see the patient again in 1 year or sooner if      needed.     Rollene Rotunda, MD, [facility]  Electronically Signed    JH/MedQ  DD: 10/16/2008  DT: 10/17/2008  Job #: [id number]   cc:   Georgina Quint. Plotnikov, MD

## 2011-03-18 NOTE — Assessment & Plan Note (Signed)
[facility]                            CARDIOLOGY OFFICE NOTE   NAME:Cameron [name], Cameron [name]                        MRN:          [id number]  DATE:06/25/2007                            DOB:          1941/01/27    PRIMARY CARE PHYSICIAN:  Dr. Trinna Post Plotnikov.   REASON FOR PRESENTATION:  Evaluate patient with coronary disease and  lower extremity weakness.   HISTORY OF PRESENT ILLNESS:  The patient presents for follow up of the  above. Since I last saw him, he thinks that he has had increased lower  extremity weakness. I described this in the last note. He was supposed  to come off of Crestor for a month to see if he had any improvement. He  does not recall whether he did this. He says that his legs are tired  during the day and even when he wakes up in the morning. 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 He does not  describe leg pain, but just tiredness and he cannot localize this. He  also thinks that he has had some slowly increasing shortness of breath  over six months. This happens at the end of the day when he walks to his  car. However, he is able to do walking for exercise on some evenings. He  said that while he gets a little short of breath when he starts out it  actually goes away the further he goes. He does not describe any chest  discomfort, neck or arm discomfort. He does not have any palpitations,  pre-syncope, or syncope. He has no PND or orthopnea.   PAST MEDICAL HISTORY:  1. Coronary artery disease (status post stenting to his right coronary      artery with subsequent CABG. [place] to the LAD, SVG to diagonal, SVG      to obtuse marginal, SVG to PDA).  2. Gastroesophageal reflux disease.  3. Dyslipidemia.  4. Appendectomy.  5. Herniorrhaphy.   ALLERGIES:  None.   CURRENT MEDICATIONS:  1. Aspirin 325 mg daily.  2. Toprol XL 25 mg daily.  3. Prilosec.  4. Diovan 40 mg daily.  5. Terazosin 2 mg b.i.d.  6.  Crestor 10 mg daily.   REVIEW OF SYSTEMS:  As stated in the HPI and otherwise negative for  other systems.   PHYSICAL EXAMINATION:  GENERAL:  The patient is in no distress.  VITAL SIGNS:  Blood pressure 119/77, heart rate 49 and regular, weight  149 pounds, body mass index 22.  HEENT:  Eyes unremarkable, pupils equal, round, and reactive to light,  fundi not visualized, oral mucosa unremarkable.  NECK:  No jugular venous distension at 45 degrees, carotid upstroke  brisk and symmetric, no bruits, or thyromegaly.  LYMPHATICS:  No lymphadenopathy.  LUNGS:  Clear to auscultation bilaterally.  BACK:  No costovertebral angle tenderness.  CHEST:  Unremarkable.  HEART:  PMI not displaced or sustained, S1 and S2 within normal limits,  no S3, no S4. No clicks, rubs, or murmurs.  ABDOMEN:  Flat, positive bowel sounds, normal  to frequency and pitch, no  bruits, rebound, guarding, midline pulsatile mass, or organomegaly.  SKIN:  No rashes, no nodules.  EXTREMITIES:  2+ pulses. No edema.   ASSESSMENT AND PLAN:  1. Fatigue. The patient has leg tiredness and generalized fatigue. He      is not sure whether he ever stopped his Crestor to see if this      might have been contributing. I am going to have him stop his      Crestor completely for two months. He is then going to come back      and see me before restarting the drug to see if his symptoms have      improved.  2. Dyspnea. The patient's dyspnea is not reproducible with exertion.      When he comes back to see me, if he is not better, I will plan a      stress perfusion study.  3. Hypertension. Blood pressure is well controlled and he will      continue the other medications as listed.  4. Dyslipidemia. He had an excellent lipid profile. He will have the      changes as described however.  5. Bradycardia. I might consider stopping his beta blocker if he is      still fatigued or has leg tiredness going forward.  6. Follow up. I will see  him back in two months as described.     Rollene Rotunda, MD, [facility]  Electronically Signed    JH/MedQ  DD: 06/25/2007  DT: 06/27/2007  Job #: [id number]

## 2011-03-18 NOTE — Assessment & Plan Note (Signed)
[facility]                            CARDIOLOGY OFFICE NOTE   NAME:Cameron [name], Cameron [name]                        MRN:          [id number]  DATE:08/13/2007                            DOB:          29-Apr-1941    PRIMARY:  Dr. Sonda Primes.   REASON FOR PRESENTATION:  Evaluate patient with coronary disease, lower  extremity weakness and pain.   HISTORY OF PRESENT ILLNESS:  The patient is 70 years old.  He has  coronary disease as described below.  He has done well since I last saw  him.  We stopped his Crestor.  He has had much less in the way of leg  weakness.  He has had no leg pain.  He has not had any nighttime leg  cramping.  He is not having any chest discomfort, neck or arm  discomfort.  He is having no palpitations, presyncope or syncope.  He is  having no PND or orthopnea.  Of note he does get a little dry heaves,  typically in the morning or when he bends over.   PAST MEDICAL HISTORY:  1. Coronary artery disease (Status post stenting to his right coronary      artery with subsequent CABG.  LIMA to the LAD, SVG to diagonal, SVG      to obtuse marginal, SVG to PDA.).  2. Gastroesophageal reflux disease.  3. Dyslipidemia.  4. Appendectomy.  5. Herniorrhaphy.   ALLERGIES:  NONE.   MEDICATIONS:  1. Aspirin 325 mg daily.  2. Toprol XL 25 mg daily.  3. Prilosec over the counter.  4. Diovan 40 mg daily.  5. Terazosin 2 mg b.i.d.   REVIEW OF SYSTEMS:  As stated in the HPI and otherwise negative for  other systems.   PHYSICAL EXAMINATION:  Patient is in no distress.  Blood pressure  126/74, heart rate 56 and regular, weight 150 pounds, body mass index  22.  HEENT:  Eyelids unremarkable, pupils equally round and reactive to  light, fundi not visualized, oral mucosa unremarkable.  NECK:  No jugular venous distention, wave form within normal limits,  carotid upstroke brisk and symmetric, no bruits, no thyromegaly.  LYMPHATICS:  No cervical,  axillary, inguinal adenopathy.  LUNGS:  Clear to auscultation bilaterally.  BACK:  No costovertebral angle tenderness.  CHEST:  Well-healed sternotomy scar.  HEART:  PMI not displaced or sustained, S1 and S2 within normal limits,  no S3, no S4, no clicks, no rubs, no murmurs.  ABDOMEN:  Flat, positive bowel sounds normal in frequency and pitch, no  bruits, no rebound, no guarding, no midline pulsatile mass, no  hepatomegaly, splenomegaly.  SKIN:  No rashes, no nodules.  EXTREMITIES:  With 2+ pulses throughout, no edema, no cyanosis, no  clubbing.  NEURO:  Oriented to person, place, and time; cranial nerves II-XII  grossly intact, motor grossly intact.   ASSESSMENT/PLAN:  1. Lower extremity weakness and pain.  This may well have been related      to the Crestor.  At this point I am going to try pravastatin 40 mg  daily.  I would very much like to have him on a statin.  He      understands this.  If he is able to tolerate this and is still      taking it in 8 weeks he will get a fasting lipid profile, if not he      will let me know.  2. Hypertension.  Blood pressure is well controlled and he will      continue the medications as listed.  3. Coronary disease.  He is having no symptoms related to this.  We      will continue with secondary risk reduction.  4. Bradycardia.  He is tolerant of this low dose of beta blocker and I      see no indication to stop it.  5. Followup.  Will see the patient back in about 6 months or sooner if      he still having problems with his statin.     Rollene Rotunda, MD, [facility]  Electronically Signed    JH/MedQ  DD: 08/13/2007  DT: 08/14/2007  Job #: [id number]   cc:   Georgina Quint. Plotnikov, MD

## 2011-03-18 NOTE — Assessment & Plan Note (Signed)
[facility]                            CARDIOLOGY OFFICE NOTE   NAME:Cameron [name], Cameron [name]                        MRN:          [id number]  DATE:07/21/2008                            DOB:          [birth year]    PRIMARY CARE PHYSICIAN:  Georgina Quint. Plotnikov, MD   REASON FOR PRESENTATION:  Evaluate the patient with coronary artery  disease and dyspnea.   HISTORY OF PRESENT ILLNESS:  The patient is 70 years old.  Since I last  saw him, he is retired.  He did describe fatigue and had an evaluation  by Dr. Posey Rea in March.  He was sent for a stress perfusion study.  There was no evidence of ischemia or infarction.  His EF was well  preserved.  Since then, he has had dyspnea.  However, this only happens  after he has done working.  He is able to do walking 3 days a week for  exercise.  He is able to do some yard work like picking tomatoes.  With  this, he does not particularly get dyspneic.  However, when he comes  into the house, he is short of breath and fatigued more than he used to  be.  He does not have any chest pressure, neck, or arm discomfort.  He  does not have any palpitations, presyncope, or syncope.  He does not  describe any resting complaints and has no PND or orthopnea.   PAST MEDICAL HISTORY:  Coronary artery disease (status post stenting to  the right coronary artery with subsequent CABG.  He had LIMA to the LAD,  SVG to diagonal, SVG to obtuse marginal, and SVG to PDA),  gastroesophageal reflux disease, dyslipidemia, appendectomy, and  herniorrhaphy.   ALLERGIES:  None.   MEDICATIONS:  1. Aspirin 325 mg daily.  2. Toprol-XL 25 mg daily.  3. Prilosec.  4. Diovan 40 mg daily.  5. Terazosin 2 mg b.i.d.  6. Pravastatin 40 mg daily.   REVIEW OF SYSTEMS:  As stated in the HPI and otherwise negative for  other systems.   PHYSICAL EXAMINATION:  GENERAL:  The patient is in no distress.  VITAL SIGNS:  Blood pressure 133/79, heart rate 51  and regular.  HEENT:  Eyelids unremarkable; pupils equal, round, and reactive to  light; fundi not visualized; oral mucosa unremarkable.  NECK:  No jugular venous distention at 45 degrees; carotid upstroke  brisk and symmetric; no bruits, no thyromegaly.  LYMPHATICS:  No cervical, axillary, or inguinal adenopathy.  LUNGS:  Clear to auscultation bilaterally.  BACK:  No costovertebral angle tenderness.  CHEST:  Well-healed sternotomy scar.  HEART:  PMI not displaced or sustained; S1 and S2 within normal limits;  no S3, no S4; no clicks, no rubs, no murmurs.  ABDOMEN:  Flat; positive bowel sounds, normal in frequency and pitch; no  bruits, no rebound, no guarding, no midline pulsatile mass; no  hepatomegaly, no splenomegaly.  SKIN:  No rashes, no nodules.  EXTREMITIES:  2+ pulses throughout; no edema, no cyanosis, no clubbing.  NEUROLOGIC:  Oriented to person, place, and time; cranial nerves  II  through XII grossly intact; motor grossly intact.   EKG - sinus bradycardia, rate 49, axis within normal limits, intervals  within normal limits, no acute ST-wave changes.   ASSESSMENT AND PLAN:  1. Dyspnea.  The patient does have dyspnea, but had negative stress      perfusion study this Spring.  He has had well-preserved ejection      fraction.  They do not suspect volume overload.  He could have a      primary lung problem.  I am going to check a CBC as he has this      complaint as well as fatigue.  This was not checked with his labs      earlier this year.  2. Fatigue as above.  I am also going to have him stop his Toprol.  He      does have bradycardia.  If he has persistent fatigue going forward,      I would probably do a 24-hour monitor to make sure he has at least      reasonable chronotropic competence.  3. Coronary artery disease.  No further cardiovascular testing is      suggested.  He will continue with risk reduction.  4. Dyslipidemia.  He had reasonable lipid profile.  He will  need a      repeat in the Spring.  The goal should be an LDL less than 100 and      HDL greater than 40.  5. Followup.  I would like to see him back in about 4 months to see if      he is having any above complaints still.     Rollene Rotunda, MD, [facility]  Electronically Signed    JH/MedQ  DD: 07/21/2008  DT: 07/22/2008  Job #: [id number]   cc:   Georgina Quint. Plotnikov, MD

## 2011-03-21 NOTE — Consult Note (Signed)
[name], [name] NO.:  [id number]   MEDICAL RECORD NO.:  [id number]          PATIENT TYPE:  INP   LOCATION:  4743                         FACILITY:  [facility]   PHYSICIAN:  Kathlee Nations Trigt III, M.D.DATE OF BIRTH:  Mar 26, 1941   DATE OF CONSULTATION:  01/10/2005  DATE OF DISCHARGE:                                   CONSULTATION   CARDIOTHORACIC SURGICAL CONSULTATION:   REQUESTING PHYSICIAN:  Rollene Rotunda, M.D.   PRIMARY CARE PHYSICIAN:  Georgina Quint. Plotnikov, M.D.   CONSULTING PHYSICIAN:  Kerin Perna, M.D.   REASON FOR CONSULTATION:  Left main and three vessel coronary artery  disease.   CHIEF COMPLAINT:  Anginal chest pain.   HISTORY OF PRESENT ILLNESS:  I was asked to evaluate this 70 year old white  male for potential surgical coronary revascularization for recently  diagnosed severe three vessel coronary artery disease.  The patient has a  history of coronary disease and underwent a right coronary stent placement  in the past.  He states this winter he has had exertional substernal chest  tightness with activity such as walking to his car from the hospital or up  his driveway.  This pain usually resolves with rest.  It is associated with  shortness of breath and without nausea or diaphoresis.  The patient has had  some orthopnea but no PND or nocturnal angina.  The patient was treated with  a right coronary stent in 1997 at which time he had a mild 30% left main  stenosis.  The patient was brought for elective cardiac cath which was  performed yesterday.  This demonstrated a 70% ostial left main stenosis, 70%  stenosis of the proximal ramus intermediate and a 75% stenosis of the  proximal right coronary prior to the stent.  His ejection fraction was 50%.  Because of his left main disease he was felt to be a candidate for surgical  revascularization.  His left ventricular end-diastolic pressure was 23 mmHg  and there is no mitral regurgitation.   PAST MEDICAL HISTORY:  1.  Hypertension.  2.  GERD.  3.  History of smoking, remote.  4.  Hyperlipidemia.   HOME MEDICATIONS:  Toprol XL 100 mg once daily, Cardura 2 mg p.o. b.i.d.,  Diovan 80 mg p.o. once daily, Zocor 20 mg once daily, aspirin 81 mg once  daily.   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.  He is married.  He smoked for 45 years but quit 2 years ago.  He denies alcohol intake.   FAMILY HISTORY:  Positive for coronary disease and myocardial infarction in  his father.  His mother died following heart bypass surgery at age 39.   REVIEW OF SYSTEMS:  Negative for constitutional symptoms of fever or weight  loss.  ENT review is negative for change in vision, difficulty swallowing or  active dental complaints.  Pulmonary review is negative for previous  thoracic trauma or abnormal chest x-ray.  Cardiac exam is positive for his  class III angina and class IV symptoms of orthopnea.  GI review is positive  for GERD, negative for change in bowel habits, hepatitis or jaundice.  Vascular review is negative for claudication, DVT or TIA.  Neurologic review  is negative for stroke, seizure or concussion.  Hematologic review is  negative for bleeding disorder or known blood transfusion.  Endocrine review  is negative for diabetes or thyroid disease.   PHYSICAL EXAMINATION:  The patient is 5 feet 8 inches, weighs 148 pounds,  his blood pressure is 110/80, pulse 60, respirations 18, temperature 97.4.  General appearance is that of a pleasant middle-age male in his hospital  room following cardiac cath in no distress.  HEENT exam is normocephalic,  full EOMs, pharynx clear, dentition adequate.  Neck is without JVD, mass or  carotid bruit.  Lymphatics reveal no palpable supraclavicular or axillary  adenopathy.  His lungs are clear.  Cardiac exam reveals regular rhythm  without S3 gallop, murmur or rub.  Abdominal exam is soft without  pulsatile  mass.  Extremities reveal no clubbing, edema or cyanosis.  Peripheral pulses  are 2+ in extremities.  Neurologic exam is alert and oriented without focal  motor deficit.   LABORATORY DATA:  I reviewed the coronary arteriograms and he has small  coronary vessels but graftable with significant proximal left main and  proximal right coronary disease.  His ejection fraction is fairly well  preserved.   PLAN:  The patient requests surgical revascularization by Dr. Laneta Simmers.  His  first available operating time is January 16, 2005.  Patient will be kept in  the hospital on IV heparin due to his symptoms of unstable angina and left  main disease.  This plan was discussed with the patient and he is agreeable.   Of note the pre-CABG Dopplers show a left carotid mild stenosis of 40-60%  otherwise unremarkable.      PV/MEDQ  D:  01/11/2005  T:  01/11/2005  Job:  [id number]

## 2011-03-21 NOTE — H&P (Signed)
Cameron, [name] NO.:  [id number]   MEDICAL RECORD NO.:  [id number]          PATIENT TYPE:  EMS   LOCATION:  MAJO                         FACILITY:  [facility]   PHYSICIAN:  Georgina Quint. Plotnikov, M.D. Romualdo Bolk OF BIRTH:  [birth year]   DATE OF ADMISSION:  01/24/2005  DATE OF DISCHARGE:  01/25/2005                                HISTORY & PHYSICAL   CHIEF COMPLAINT:  Nausea and vomiting.   HISTORY OF PRESENT ILLNESS:  The patient is a 70 year old male, status post  bypass surgery on January 16, 2005, who present today with his wife with  complaint of feeling real bad starting Friday, unable to eat or keep  anything down for three days, vomiting all night, dry heaving, some  shortness of breath. He denies chest pain or calf pain. He had a fairly good  day on Thursday and then everything started on Friday. He has not been able  to sleep for two nights in a row.   CURRENT MEDICATIONS:  1.  Lasix 40 mg daily.  2.  K-Dur 20.  3.  Phenergan 25 mg p.r. p.r.n.  4.  Toprol XL 25 mg daily.  5.  Zocor 20 mg daily.  6.  Ultram p.r.n.   FAMILY HISTORY:  positive for coronary artery disease.   SOCIAL HISTORY:  He quit smoking several months ago. He works at [employer] He is married with children.   PAST MEDICAL HISTORY:  COPD, coronary artery disease, dyslipidemia.   REVIEW OF SYSTEMS:  As above. No urinary symptoms. Does have gurgling in the  chest. No syncope. Had a stool last night. No blood in the stool. The rest  is negative. No vertigo. Weak when stands up.   PHYSICAL EXAMINATION:  VITAL SIGNS: Blood pressure 138/92, pulse 87,  temperature 96.7.  GENERAL: He appears ill.  SKIN: Pallor.  HEENT: Dry oral mucosa.  NECK: Supple. No meningeal signs.  LUNGS: Clear with decreased breath sounds at the bases.  HEART: S1 and S2. No murmurs, rubs, or gallops.  ABDOMEN: Soft, nontender. Sensitive postoperative.  EXTREMITIES: Lower extremities without edema. Calves  nontender.  NEUROLOGIC: He is alert, oriented, and cooperative.  SKIN: Without rashes.   LABORATORY DATA:  O2 saturation 98% on room air.   ASSESSMENT/PLAN:  1.  Nausea and vomiting since Friday. Admit for IV fluids. Obtain chest x-      ray, echocardiogram, KUB. Will obtain ultrasound, amylase, lipase, and      other tests.  2.  Post bypass surgery. Obtain labs. Will notify CVTS.  3.  Dyspnea, postoperative. Obtain cardiac echo and chest x-ray. Will use      albuterol and Atrovent via handheld nebulization.  4.  Dehydration. Will treat with IV fluids.  5.  Chronic obstructive pulmonary disease with a history of steroid use in      the past. Will give IV Solu-Medrol for one day.      AVP/MEDQ  D:  01/27/2005  T:  01/27/2005  Job:  [id number]

## 2011-03-21 NOTE — Assessment & Plan Note (Signed)
[facility]                           PRIMARY CARE OFFICE NOTE   NAME:Cameron [name], Cameron [name]                        MRN:          [id number]  DATE:02/17/2007                            DOB:          [birth year]    The patient is a 70 year old male who presents for a wellness  examination.   PAST MEDICAL HISTORY:  1. Coronary disease.  2. Dyslipidemia.  3. Bypass surgery in 2006.   FAMILY HISTORY:  Positive for coronary disease.   SOCIAL HISTORY:  He quit smoking.  He continues to work and plays golf  on the weekend.   CURRENT MEDICATIONS:  Reviewed.  He is on Crestor 10 mg at night.   ALLERGIES:  None.   REVIEW OF SYSTEMS:  No chest pain or shortness of breath. No syncope, no  neurologic complaints.  No GI symptoms. The rest of 18 point review of  systems is negative.   PHYSICAL EXAMINATION:  VITAL SIGNS:  Blood pressure 120/68, pulse 53,  temperature 97. Weight is 160 pounds.  GENERAL:  He looks well, he is in no acute distress.  HEENT:  Moist mucosa.  NECK:  Supple, no thyromegaly or bruits.  LUNGS:  Clear to percussion, no wheeze or rales.  HEART:  S1, S2 no murmur, no gallop.  ABDOMEN:  Soft, nontender, no organomegaly, no mass felt.  EXTREMITIES:  Lower extremities without edema.  NEURO:  He is alert, oriented and cooperative.  RECTAL EXAM:  Reveals a normal prostate, no nodules. No rectal masses.  Stool Guaiac negative.   LABORATORY DATA:  Colonoscopy in June, 2007.  EKG today with sinus  bradycardia, heart rate 49.   ASSESSMENT AND PLAN:  1. Normal wellness examination. Age/health-related issues discussed.      Healthy lifestyle discussed. Good diet and physical activity      emphasized.  Information about Zostavax  provided.  Pneumovax was      given today. Obtain chest x-ray.  2. Coronary artery disease. Continue current therapy.  3. Dyslipidemia.  She is on Crestor 1/2 tablet a day. If muscle pain,      he can start using it every  other day.  4. Chronic obstructive pulmonary disease, asymptomatic at present.  5. Benign chest x-ray.  6. Benign prostatic hypertrophy, doing well overall. Obtain PSA.  7. Fatigue.  He can take Toprol XL 25 mg 1/2 daily or hold for a while      to see if it makes a difference.  8. Bradycardia.   He will let me know if there are any problems.     Georgina Quint. Plotnikov, MD  Electronically Signed    AVP/MedQ  DD: 02/19/2007  DT: 02/19/2007  Job #: [id number]

## 2011-03-21 NOTE — Discharge Summary (Signed)
NAMEFAUSTINO, Cameron [name]               ACCOUNT NO.:  [id number]   MEDICAL RECORD NO.:  [id number]          PATIENT TYPE:  INP   LOCATION:  2005                         FACILITY:  [facility]   PHYSICIAN:  Rene Paci, M.D. LHCDATE OF BIRTH:  [birth year]   DATE OF ADMISSION:  01/27/2005  DATE OF DISCHARGE:  01/29/2005                                 DISCHARGE SUMMARY   DISCHARGE DIAGNOSES:  1.  Nausea, vomiting, and diarrhea, likely secondary to ileus, no evidence      of infectious problems identified, rule out cholecystitis, but normal      LFTs.  No abdominal ultrasound.  2.  Coronary disease, status post CABG x3 (two weeks earlier).  CVTS      notified and saw patient March 27.  Continue medical management.  3.  Thrombocytosis.  Likely reactive to #1.  Follow up as outpatient.  4.  Chronic obstructive pulmonary disease with long history of tobacco      abuse.  Trail 24 hours Solu-Medrol, exacerbation improved.  5.  Normocytic anemia.  Discharge hemoglobin 9.6.   DISCHARGE MEDICATIONS:  As prior to admission without change, including  Lasix, K-Dur, Toprol, Zocor, and Ultram, and doses as prior to admission.   FOLLOW UP:  Hospital follow-up has been arranged with primary M.D. for early  next week for Wednesday, April 5, at 11:15 a.m. to review symptoms as well  as medical management.  Follow up with cardiology and Dr. Laneta Simmers as  previously scheduled later April.   CONDITION ON DISCHARGE:  Medically stable.   HOSPITAL COURSE BY PROBLEM:  NAUSEA, VOMITING WITH DIARRHEA.  The patient is  a 70 year old gentleman who is 2-1/2 weeks post bypass surgery.  He  presented to his primary M.D. with three days of nausea and vomiting with  loose stool concerned for dehydration as well as infectious source, prompted  admission and further evaluation.  He was treated with IV fluids and  abdominal ultrasound was checked that showed gallbladder sludge but no  gallbladder wall thickening, bile duct  dilation, or stones.  No other  evidence of infection could be identified.  The patient improved with IV  fluids, initial bowel rest, and an advanced diet without __________.  He was  treated with proton pump inhibitor and showed slow continued improvement.  CVTS was notified regarding the patient's admission; however under continued  medical management and was stable from a cardiovascular standpoint with  improvement of patient's symptoms, resolution of the nausea and vomiting and  tolerating a regular diet and home medications, he was felt stable for  discharge home with close follow-up next week with primary M.D. as  previously discussed and outpatient follow-up with cardiology and surgery as  previously scheduled.       VL/MEDQ  D:  04/23/2005  T:  04/23/2005  Job:  [id number]

## 2011-03-21 NOTE — Cardiovascular Report (Signed)
NAMESEMIR, BRILL NO.:  [id number]   MEDICAL RECORD NO.:  [id number]          PATIENT TYPE:  OIB   LOCATION:  6501                         FACILITY:  [facility]   PHYSICIAN:  Arvilla Meres, M.D. LHCDATE OF BIRTH:  1941/07/12   DATE OF PROCEDURE:  01/10/2005  DATE OF DISCHARGE:                              CARDIAC CATHETERIZATION   PRIMARY CARE PHYSICIAN:  Georgina Quint. Plotnikov, M.D. [facility]   CARDIOLOGIST:  Rollene Rotunda, M.D.   PATIENT IDENTIFICATION:  Mr. Cameron [name] is a very pleasant 69 year old male  with history of coronary artery disease status post an RCA stent in 1997 who  was referred for cardiac catheterization secondary to progressive angina.   PROCEDURE PERFORMED:  1.  Selective coronary angiography.  2.  Left heart catheterization.  3.  Left ventriculogram.   CATHETERS USED:  4 [place] JL-4, 4 [place] JR-4, and a 4 French bent pigtail.   DESCRIPTION OF PROCEDURE:  The risks and benefits of catheterization were  explained to Mr. Cameron [name].  Consent was signed and placed on the chart.  The  right groin was prepped and draped in routine sterile fashion.  Subsequently, anesthetized with 1% local lidocaine.  A 4 French arterial  sheath was placed in the right femoral artery using a modified Seldinger  technique.  Standard catheters were used for the procedure and all catheter  exchanges were made over wire.  At the end of the procedure, catheters were  removed and the patient was transferred to the holding area in stable  condition for removal of his sheath.   FINDINGS:   HEMODYNAMICS:  Central aortic pressure was 162/88 with  mean of 117.  LV  pressure was 187/14 with EDP of 22.  There was no significant gradient on  aortic valve pullback.   CORONARY ARTERIES:  1.  Left main:  Had a 60-70% ostial lesion followed by 40-50% tubular lesion      in the body of the vessel.  2.  LAD:  Long vessel that wrapped the apex.  It gave off two normal size  diagonals.  There was a 30% lesion in the proximal portion.  3.  Left circumflex:  Gave off a large ramus branch as well as normal size      OM-1 and small OM-2.  There was 50% ostial lesion in the ramus followed      by 70-80% lesion in the proximal portion.  Otherwise, the vessel was      free of significant coronary disease.  4.  The right coronary artery was a dominant vessel.  It gave off large PDA      and one small PL and one normal size PL.  There was a stent in the mid      section which was widely patent.  However, prior to the stent there was      a 75% focal stenosis.  This was eccentric plaque.  There was 30% in the      mid section of the vessel and 30% distal.   Left ventriculogram done in the RAO approach showed an EF of  approximately  50% with mild anterior hypokinesis.  There was no significant MR.  Panning  down looking at the aorta, the abdominal aorta had mild plaquing, but no  evidence of abdominal aortic aneurysm.  The renal arteries were not well  visualized given angulation.   ASSESSMENT/PLAN:  1.  60-70% ostial left main lesion.  2.  75% eccentric plaque in the right coronary artery prior to previous      stent.  3.  70-80% lesion in the ramus.  4.  We have consulted [facility] to evaluate for potential coronary artery bypass      graft.  5.  Have discussed the case with Dr. Antoine Poche, the patient's primary      cardiologist, and we will go ahead and admit him to await his bypass      surgery.      DB/MEDQ  D:  01/10/2005  T:  01/10/2005  Job:  [id number]   cc:   Rollene Rotunda, M.D.   Georgina Quint. Plotnikov, M.D. [facility]

## 2011-03-21 NOTE — Op Note (Signed)
NAMEBECKHEM, Cameron [name]   MEDICAL RECORD NO.:  [id number]          PATIENT TYPE:  INP   LOCATION:  2310                         FACILITY:  [facility]   PHYSICIAN:  Evelene Croon, M.D.     DATE OF BIRTH:  1941-05-21   DATE OF PROCEDURE:  01/16/2005  DATE OF DISCHARGE:                                 OPERATIVE REPORT   PREOPERATIVE DIAGNOSIS:  Left main and three-vessel coronary artery disease.   POSTOPERATIVE DIAGNOSIS:  Left main and three-vessel coronary artery  disease.   OPERATIVE PROCEDURE:  Median sternotomy, extracorporeal circulation,  coronary artery bypass graft surgery x4 using a left internal mammary artery  graft to the left anterior descending coronary artery, with a saphenous vein  graft to the diagonal branch of the left anterior descending coronary  artery, saphenous vein graft to the intermediate coronary artery, and a  saphenous vein graft to the posterior descending branch of the right  coronary artery.  Endoscopic vein harvesting from the left leg.   ATTENDING SURGEON:  Evelene Croon, M.D.   ASSISTANT:  Toribio Harbour, N.P.   ANESTHESIA:  General endotracheal.   CLINICAL HISTORY:  This patient is a 70 year old gentleman with a history of  coronary disease, status post stenting of his right coronary artery in 1997.  At that time he had a mild 30% left main stenosis.  The patient now presents  with exertional substernal chest pain that developed this winter while  walking from his car to the hospital or up his driveway.  It was associated  with shortness of breath.  Symptoms were resolved with rest.  He underwent  repeat cardiac catheterization, which showed an ostial 70% left main  stenosis.  There was about 70% stenosis of a proximal intermediate branch.  There was about 75% stenosis in the right coronary artery proximal to the  stent.  His ejection fraction was about 50%.  Due to his left main disease,  he was felt to be a  candidate for surgical revascularization.  I discussed  the operative procedure of coronary bypass surgery with him and his wife  including alternatives, benefits and risks, including bleeding, blood  transfusion, infection, stroke, myocardial infarction, graft failure, and  death.  They understood and agreed to proceed.   OPERATIVE PROCEDURE:  The patient was taken to the operating room and placed  on the table in supine position.  After induction of general endotracheal  anesthesia, a Foley catheter was placed in the bladder using sterile  technique.  Then the chest, abdomen and both lower extremities were prepped  and draped in the usual sterile manner.  The chest was entered through a  median sternotomy incision and the pericardium opened in the midline.  Examination of the heart showed good ventricular contractility.  The  ascending aorta had no palpable plaque in it.   Then the left internal mammary artery was harvested from the chest wall as a  pedicle graft.  This was a medium-caliber vessel with excellent blood flow  through it.  At the same time a segment of greater saphenous  vein was  harvested from the left leg using endoscopic vein harvest technique.  This  vein was of medium size and good quality.  We initially examined the right  leg for the saphenous vein adjacent to the knee, but this was a small vein  that was unsuitable.  Then the patient was heparinized and when an adequate  activated clotting time was achieved, the distal ascending aorta was  cannulated using a 20 French aortic cannula for arterial inflow.  Venous  outflow was achieved using a two-stage venous cannula through the right  atrial appendage.  An antegrade cardioplegia and vent cannula was inserted  in the aortic root.   The patient was placed on cardiopulmonary bypass and the distal coronary  arteries identified.  The LAD was lying deep in the interventricular groove.  It was located in the proximal  portion just beyond the takeoff of the larger  diagonal branch.  The remainder of the vessel was intramyocardial until it  reached the apex.  The LAD did have significant plaque present between the  location where it was visualized in the midportion and the more proximal  diagonal branch.  There was no significant stenosis noted on the angiogram  in this location, but I was concerned about the degree of plaque here.  The  intermediate was intramyocardial and was located proximally, where it was a  medium-sized graftable vessel.  The obtuse marginal branch was a small,  nongraftable vessel.  The right coronary artery gave off a medium-sized  posterior descending branch that was graftable.  The right coronary artery  was diffusely diseased.   Then the aorta was crossclamped and 500 mL of cold blood antegrade  cardioplegia was administered in the aortic root with quick arrest of the  heart.  Systemic hypothermia to 20 degrees Centigrade and topical  hypothermia with iced saline was used.  A temperature probe was placed in  the septum and an insulating pad in the pericardium.   The first distal anastomosis was performed to the intermediate vessel.  The  internal diameter of this vessel was about 1.6 mm.  The conduit used was a  segment of greater saphenous vein and the anastomosis performed in an end-to-  side manner using continuous 7-0 Prolene suture.  Flow was measured through  the graft and was excellent.   The second distal anastomosis was performed to the posterior descending  coronary artery.  The internal diameter was about 1.6 mm.  The conduit used  was a second segment of greater saphenous vein and the anastomosis performed  in an end-to-side manner using continuous 7-0 Prolene suture.  Flow was  measured through the graft and was excellent.  Another dose of cardioplegia  was given down the vein grafts and in the aortic root.  The third distal anastomosis was performed to the  diagonal branch.  The  internal diameter of this vessel was about 1.5 mm.  The conduit used was a  third segment of greater saphenous vein and the anastomosis performed in an  end-to-side manner using continuous 7-0 Prolene suture.  Flow was measured  through this graft and was excellent.   Then the fourth distal anastomosis was performed to the midportion of the  left anterior descending coronary artery.  The internal diameter was about  1.6 mm.  There was a fair amount of calcific plaque present just proximal to  the arteriotomy site.  The conduit used was the left internal mammary graft,  and it was  brought through an opening in the left pericardium anterior to  the phrenic nerve.  It was anastomosed to the LAD in end-to-side manner  using continuous 8-0 Prolene suture.  The pedicle was tacked to the  epicardium with 6-0 Prolene sutures.  The patient was rewarmed to 37 degrees  Centigrade.  With the crossclamp in place, the three proximal vein graft  anastomoses were performed in the aortic root in end-to-side manner using  continuous 6-0 Prolene suture.  Then the clamp was removed from the mammary  pedicle.  There was rapid warming of the ventricular septum and return of  spontaneous ventricular fibrillation.  The crossclamp was removed with a  time of 85 minutes and the patient spontaneously converted to sinus rhythm.   The proximal and distal anastomoses appeared hemostatic and the alignment of  the grafts satisfactory.  Graft markers were placed around the proximal  anastomoses.  Two temporary right ventricular and right atrial pacing wires  were placed and brought out through the skin.   When the patient had rewarmed to 37 degrees Centigrade, he was weaned from  cardiopulmonary bypass on no inotropic agents.  Total bypass time was 100  minutes.  Cardiac function appeared excellent with a cardiac output of 5  L/min.  Protamine was given and the venous and aortic cannulas were  removed  without difficulty.  Hemostasis was achieved.  Three chest tubes were placed  with a tube in the posterior pericardium, one in the left pleural space and  one in the anterior mediastinum.  The pericardium was reapproximated over  the heart.  The sternum was closed with #6 stainless steel wires.  The  fascia was closed with continuous #1 Vicryl suture.  The subcutaneous tissue  was closed with continuous 2-0 Vicryl and the skin with 3-0 Vicryl  subcuticular closure.  The lower extremity vein harvest site was closed in  layers in a similar manner.  The sponge, needle and instrument counts were  correct according to the scrub nurse.  Dry sterile dressings were applied  over the incisions and around the chest tubes, which were hooked to  Pleuravac suction.  The patient remained hemodynamically stable and was  transported to the SICU in guarded but stable condition.      Cameron [name]  D:  01/16/2005  T:  01/17/2005  Job:  [id number]  cc:   [facility] Cardiology   Cardiac Catheterization Laboratory

## 2011-03-26 ENCOUNTER — Ambulatory Visit: Payer: Medicare Other | Admitting: Rehabilitative and Restorative Service Providers"

## 2011-03-26 ENCOUNTER — Encounter: Payer: Self-pay | Admitting: *Deleted

## 2011-04-21 ENCOUNTER — Ambulatory Visit: Payer: Medicare Other | Attending: Rehabilitation

## 2011-04-21 DIAGNOSIS — M25519 Pain in unspecified shoulder: Secondary | ICD-10-CM | POA: Insufficient documentation

## 2011-04-21 DIAGNOSIS — M546 Pain in thoracic spine: Secondary | ICD-10-CM | POA: Insufficient documentation

## 2011-04-21 DIAGNOSIS — IMO0001 Reserved for inherently not codable concepts without codable children: Secondary | ICD-10-CM | POA: Insufficient documentation

## 2011-04-23 ENCOUNTER — Ambulatory Visit: Payer: Medicare Other

## 2011-04-27 ENCOUNTER — Other Ambulatory Visit: Payer: Self-pay | Admitting: Cardiology

## 2011-05-02 ENCOUNTER — Ambulatory Visit: Payer: Medicare Other

## 2011-05-26 ENCOUNTER — Other Ambulatory Visit: Payer: Self-pay | Admitting: Internal Medicine

## 2011-07-15 ENCOUNTER — Other Ambulatory Visit: Payer: Self-pay | Admitting: Cardiology

## 2011-08-12 LAB — POCT RAPID STREP A: Streptococcus, Group A Screen (Direct): NEGATIVE

## 2011-10-06 ENCOUNTER — Ambulatory Visit (INDEPENDENT_AMBULATORY_CARE_PROVIDER_SITE_OTHER): Payer: Medicare Other | Admitting: *Deleted

## 2011-10-06 DIAGNOSIS — Z23 Encounter for immunization: Secondary | ICD-10-CM

## 2011-10-26 ENCOUNTER — Other Ambulatory Visit: Payer: Self-pay | Admitting: Internal Medicine

## 2012-01-20 ENCOUNTER — Other Ambulatory Visit: Payer: Self-pay | Admitting: Internal Medicine

## 2012-02-20 ENCOUNTER — Ambulatory Visit (INDEPENDENT_AMBULATORY_CARE_PROVIDER_SITE_OTHER): Payer: Medicare Other | Admitting: Internal Medicine

## 2012-02-20 ENCOUNTER — Encounter: Payer: Self-pay | Admitting: Internal Medicine

## 2012-02-20 ENCOUNTER — Ambulatory Visit (INDEPENDENT_AMBULATORY_CARE_PROVIDER_SITE_OTHER)
Admission: RE | Admit: 2012-02-20 | Discharge: 2012-02-20 | Disposition: A | Discharge status: Home / Self Care | Payer: Medicare Other | Source: Ambulatory Visit | Attending: Internal Medicine | Admitting: Internal Medicine

## 2012-02-20 VITALS — BP 120/78 | HR 73 | Temp 97.1°F | Resp 16 | Wt 139.0 lb

## 2012-02-20 DIAGNOSIS — R05 Cough: Secondary | ICD-10-CM

## 2012-02-20 DIAGNOSIS — J209 Acute bronchitis, unspecified: Secondary | ICD-10-CM | POA: Insufficient documentation

## 2012-02-20 DIAGNOSIS — R930 Abnormal findings on diagnostic imaging of skull and head, not elsewhere classified: Secondary | ICD-10-CM

## 2012-02-20 MED ORDER — CEFUROXIME AXETIL 500 MG PO TABS: 500.0000 mg | ORAL_TABLET | Freq: Two times a day (BID) | ORAL | Status: AC

## 2012-02-20 MED ORDER — GUAIFENESIN-CODEINE 100-10 MG/5ML PO SYRP: 5.0000 mL | ORAL_SOLUTION | Freq: Three times a day (TID) | ORAL | Status: AC | PRN

## 2012-02-20 NOTE — Assessment & Plan Note (Signed)
Repeat CXR today 

## 2012-02-20 NOTE — Patient Instructions (Signed)

## 2012-02-20 NOTE — Assessment & Plan Note (Signed)
I will check his CXR today to look for pna, mass, edema, effusion

## 2012-02-20 NOTE — Assessment & Plan Note (Signed)
Start ceftin for the infection and a cough suppressant 

## 2012-02-20 NOTE — Progress Notes (Signed)
  Subjective:    Patient ID: Cameron [name], male    DOB: [birth year], 71 y.o.   MRN: [id number]  Cough This is a new problem. The current episode started in the past 7 days. The problem has been gradually worsening. The problem occurs every few hours. The cough is productive of purulent sputum. Associated symptoms include chills and a sore throat. Pertinent negatives include no chest pain, ear congestion, ear pain, fever, headaches, heartburn, hemoptysis, myalgias, nasal congestion, postnasal drip, rash, rhinorrhea, shortness of breath, sweats, weight loss or wheezing. He has tried steroid inhaler and a beta-agonist inhaler for the symptoms. The treatment provided moderate relief. His past medical history is significant for COPD.      Review of Systems  Constitutional: Positive for chills. Negative for fever, weight loss, diaphoresis, activity change, appetite change, fatigue and unexpected weight change.  HENT: Positive for sore throat. Negative for ear pain, rhinorrhea and postnasal drip.   Eyes: Negative.   Respiratory: Positive for cough. Negative for apnea, hemoptysis, choking, chest tightness, shortness of breath, wheezing and stridor.   Cardiovascular: Negative for chest pain, palpitations and leg swelling.  Gastrointestinal: Negative.  Negative for heartburn.  Genitourinary: Negative.   Musculoskeletal: Negative.  Negative for myalgias.  Skin: Negative for color change, pallor, rash and wound.  Neurological: Negative.  Negative for headaches.  Hematological: Negative for adenopathy. Does not bruise/bleed easily.  Psychiatric/Behavioral: Negative.        Objective:   Physical Exam  Vitals reviewed. Constitutional: He is oriented to person, place, and time. He appears well-developed and well-nourished.  Non-toxic appearance. He does not have a sickly appearance. He does not appear ill. No distress.  HENT:  Head: Normocephalic and atraumatic.  Mouth/Throat: No oropharyngeal  exudate.  Eyes: Conjunctivae are normal. Right eye exhibits no discharge. Left eye exhibits no discharge. No scleral icterus.  Neck: Normal range of motion. Neck supple. No JVD present. No tracheal deviation present. No thyromegaly present.  Cardiovascular: Normal rate, regular rhythm, normal heart sounds and intact distal pulses.  Exam reveals no gallop and no friction rub.   No murmur heard. Pulmonary/Chest: Effort normal and breath sounds normal. No stridor. No respiratory distress. He has no wheezes. He has no rales. He exhibits no tenderness.  Abdominal: Soft. Bowel sounds are normal. He exhibits no distension. There is no tenderness. There is no rebound and no guarding.  Musculoskeletal: Normal range of motion. He exhibits no edema and no tenderness.  Lymphadenopathy:    He has no cervical adenopathy.  Neurological: He is oriented to person, place, and time.  Skin: Skin is warm and dry. No rash noted. He is not diaphoretic. No erythema. No pallor.  Psychiatric: He has a normal mood and affect. His behavior is normal. Judgment and thought content normal.    Lab Results  Component Value Date   WBC 5.9 06/19/2010   HGB 15.0 06/19/2010   HCT 44.0 06/19/2010   PLT 200 06/19/2010   GLUCOSE 118* 06/19/2010   CHOL 143 03/17/2011   TRIG 76.0 03/17/2011   HDL 41.50 03/17/2011   LDLCALC 86 03/17/2011   ALT 16 06/19/2010   AST 20 06/19/2010   NA 141 06/19/2010   K 4.1 06/19/2010   CL 106 06/19/2010   CREATININE 1.4 06/19/2010   BUN 20 06/19/2010   CO2 27 06/19/2010   TSH 1.67 01/30/2009   PSA 1.45 02/19/2007      Assessment & Plan:

## 2012-03-01 ENCOUNTER — Ambulatory Visit (INDEPENDENT_AMBULATORY_CARE_PROVIDER_SITE_OTHER): Payer: Medicare Other | Admitting: Internal Medicine

## 2012-03-01 ENCOUNTER — Encounter: Payer: Self-pay | Admitting: Internal Medicine

## 2012-03-01 VITALS — BP 110/84 | HR 72 | Temp 97.4°F | Wt 142.0 lb

## 2012-03-01 DIAGNOSIS — J029 Acute pharyngitis, unspecified: Secondary | ICD-10-CM | POA: Insufficient documentation

## 2012-03-01 MED ORDER — AMOXICILLIN 500 MG PO CAPS: 1000.0000 mg | ORAL_CAPSULE | Freq: Two times a day (BID) | ORAL | Status: AC

## 2012-03-01 NOTE — Progress Notes (Signed)
  Subjective:    Patient ID: Cameron [name], male    DOB: April 17, 1941, 71 y.o.   MRN: [id number]  HPI  C/o ST x 2 d. He took 2 amoxicillins that he has left from before. He was not able to sleep last night...  Review of Systems  Constitutional: Negative for fever, appetite change, fatigue and unexpected weight change.  HENT: Positive for sore throat. Negative for nosebleeds, congestion, sneezing, trouble swallowing and neck pain.   Eyes: Negative for itching and visual disturbance.  Respiratory: Negative for cough.   Cardiovascular: Negative for chest pain, palpitations and leg swelling.  Gastrointestinal: Negative for nausea, diarrhea, blood in stool and abdominal distention.  Genitourinary: Negative for frequency and hematuria.  Musculoskeletal: Negative for back pain, joint swelling and gait problem.  Skin: Negative for rash.  Neurological: Negative for dizziness, tremors, speech difficulty and weakness.  Psychiatric/Behavioral: Negative for sleep disturbance, dysphoric mood and agitation. The patient is not nervous/anxious.        Objective:   Physical Exam  Constitutional: He is oriented to person, place, and time. He appears well-developed.  HENT:  Mouth/Throat: Oropharynx is clear and moist.       eryth throat  Eyes: Conjunctivae are normal. Pupils are equal, round, and reactive to light.  Neck: Normal range of motion. No JVD present. No thyromegaly present.  Cardiovascular: Normal rate, regular rhythm, normal heart sounds and intact distal pulses.  Exam reveals no gallop and no friction rub.   No murmur heard. Pulmonary/Chest: Effort normal and breath sounds normal. No respiratory distress. He has no wheezes. He has no rales. He exhibits no tenderness.  Abdominal: Soft. Bowel sounds are normal. He exhibits no distension and no mass. There is no tenderness. There is no rebound and no guarding.  Musculoskeletal: Normal range of motion. He exhibits no edema and no tenderness.    Lymphadenopathy:    He has no cervical adenopathy.  Neurological: He is alert and oriented to person, place, and time. He has normal reflexes. No cranial nerve deficit. He exhibits normal muscle tone. Coordination normal.  Skin: Skin is warm and dry. No rash noted.  Psychiatric: He has a normal mood and affect. His behavior is normal. Judgment and thought content normal.     Rapid strep (-)     Assessment & Plan:

## 2012-03-01 NOTE — Assessment & Plan Note (Signed)
4/13 Amoxicillin if worse See OTC meds

## 2012-03-01 NOTE — Patient Instructions (Signed)
Use over-the-counter Ricola or Cepacol lozenges    Avoid decongestants if you have high blood pressure and use "Afrin" nasal spray for nasal congestion as directed instead. Use" Delsym" or" Robitussin" cough syrup varietis for cough.  You can use plain "Tylenol" or "Advil" for fever, chills and achyness.   "Common cold" symptoms are usually triggered by a virus.  The antibiotics are usually not necessary. On average, a" viral cold" illness would take 4-7 days to resolve. Please, make an appointment if you are not better or if you're worse.

## 2012-03-15 ENCOUNTER — Ambulatory Visit (INDEPENDENT_AMBULATORY_CARE_PROVIDER_SITE_OTHER): Payer: Medicare Other | Admitting: Cardiology

## 2012-03-15 ENCOUNTER — Encounter: Payer: Self-pay | Admitting: Cardiology

## 2012-03-15 VITALS — BP 160/90 | HR 57 | Ht 68.0 in | Wt 140.4 lb

## 2012-03-15 DIAGNOSIS — E7800 Pure hypercholesterolemia, unspecified: Secondary | ICD-10-CM

## 2012-03-15 DIAGNOSIS — I2510 Atherosclerotic heart disease of native coronary artery without angina pectoris: Secondary | ICD-10-CM

## 2012-03-15 MED ORDER — LOSARTAN POTASSIUM 25 MG PO TABS: 25.0000 mg | ORAL_TABLET | Freq: Two times a day (BID) | ORAL | Status: DC

## 2012-03-15 MED ORDER — PRAVASTATIN SODIUM 40 MG PO TABS: 40.0000 mg | ORAL_TABLET | Freq: Every day | ORAL | Status: DC

## 2012-03-15 MED ORDER — METOPROLOL SUCCINATE ER 25 MG PO TB24: 25.0000 mg | ORAL_TABLET | Freq: Every day | ORAL | Status: DC

## 2012-03-15 MED ORDER — NITROGLYCERIN 0.4 MG SL SUBL: 0.4000 mg | SUBLINGUAL_TABLET | SUBLINGUAL | Status: DC | PRN

## 2012-03-15 MED ORDER — OMEPRAZOLE 20 MG PO CPDR: 20.0000 mg | DELAYED_RELEASE_CAPSULE | Freq: Every day | ORAL | Status: DC

## 2012-03-15 NOTE — Patient Instructions (Signed)
Please stop your Diovan and start Cozaar (Losartan) 25 mg one twice a day. Continue all other medications as listed  Your physician has requested that you have a lexiscan myoview. For further information please visit [email or web address] Please follow instruction sheet, as given.

## 2012-03-15 NOTE — Assessment & Plan Note (Signed)
I will check a lipid profile with a goal LDL less than 100 and HDL greater than 40. He was at his target last year.

## 2012-03-15 NOTE — Progress Notes (Signed)
HPI The patient returned for one year followup. Since I last saw him he has continued to have dyspnea with exertion. He described as somewhat last year but it might be slightly worse. He was told in the past he should wear oxygen but he sent this back last year because his oxygen levels were never low. He's getting short of breath walking and carrying groceries. He still golfing. He is not describing PND or orthopnea. He's not describing chest pressure, neck or arm discomfort. He has no palpitations, presyncope or syncope.  Of note his blood pressures have been somewhat labile. He occasionally has pressures as low as 80 systolic. He self-medicating with varying doses of his valsartan.   Allergies  Allergen Reactions  . Rosuvastatin     REACTION: aches    Current Outpatient Prescriptions  Medication Sig Dispense Refill  . metoprolol succinate (TOPROL-XL) 25 MG 24 hr tablet       . nitroGLYCERIN (NITROSTAT) 0.4 MG SL tablet Place 0.4 mg under the tongue every 5 (five) minutes as needed.        Marland Kitchen omeprazole (PRILOSEC) 20 MG capsule TAKE 1 CAPSULE DAILY  90 capsule  1  . pravastatin (PRAVACHOL) 40 MG tablet TAKE 1 TABLET DAILY  90 tablet  4  . SYMBICORT 160-4.5 MCG/ACT inhaler USE 2 PUFFS BY MOUTH TWICE A DAY  10.2 g  5  . Tamsulosin HCl (FLOMAX) 0.4 MG CAPS Take by mouth daily.      . valsartan (DIOVAN) 80 MG tablet Take 80 mg by mouth daily.        Marland Kitchen DISCONTD: metoprolol succinate (TOPROL-XL) 25 MG 24 hr tablet TAKE 1 TABLET DAILY  45 tablet  3  . traMADol (ULTRAM) 50 MG tablet Take 50 mg by mouth every 6 (six) hours as needed.          Past Medical History  Diagnosis Date  . COPD (chronic obstructive pulmonary disease)   . Asthma   . Coronary artery disease     s/p stenting to the right coronary artery with subsequent CABG.  He has LIMA to the LAD,SVG to diagonal, SVG to obtuse marginal, and SVG to PDA,  . Hyperlipidemia   . Osteoarthritis   . GERD (gastroesophageal reflux disease)     . Low back pain     Past Surgical History  Procedure Date  . Coronary artery bypass graft 2006  . Appendectomy   . Hemorrhoid surgery     ROS:  As stated in the history of present illness negative roll of systems.  PHYSICAL EXAM BP 160/90  Pulse 57  Ht 5\' 8"  (1.727 m)  Wt 140 lb 6.4 oz (63.685 kg)  BMI 21.35 kg/m2 GENERAL:  Well appearing HEENT:  Pupils equal round and reactive, fundi not visualized, oral mucosa unremarkable, dentures NECK:  No jugular venous distention, waveform within normal limits, carotid upstroke brisk and symmetric, no bruits, no thyromegaly LYMPHATICS:  No cervical, inguinal adenopathy LUNGS:  Clear to auscultation bilaterally BACK:  No CVA tenderness CHEST:  Well healed sternotomy scar. HEART:  PMI not displaced or sustained,S1 and S2 within normal limits, no S3, no S4, no clicks, no rubs, no murmurs ABD:  Flat, positive bowel sounds normal in frequency in pitch, no bruits, no rebound, no guarding, no midline pulsatile mass, no hepatomegaly, no splenomegaly EXT:  2 plus pulses throughout, no edema, no cyanosis no clubbing SKIN:  No rashes no nodules NEURO:  Cranial nerves II through XII grossly intact, motor  grossly intact throughout [facility]:  Cognitively intact, oriented to person place and time  EKG:  .Sinus bradycardia, rate 57, axis within normal limits, intervals within normal limits, no acute ST-T wave changes.  03/15/2012  ASSESSMENT AND PLAN

## 2012-03-15 NOTE — Assessment & Plan Note (Signed)
This could be an anginal equivalent. He needs a stress test as he has not had one in 2010. He would not be a little walk on a treadmill so he will have a [employer].

## 2012-03-22 ENCOUNTER — Other Ambulatory Visit (INDEPENDENT_AMBULATORY_CARE_PROVIDER_SITE_OTHER): Payer: Medicare Other

## 2012-03-22 ENCOUNTER — Ambulatory Visit (HOSPITAL_COMMUNITY): Payer: Medicare Other | Attending: Internal Medicine | Admitting: Radiology

## 2012-03-22 VITALS — BP 138/96 | Ht 68.0 in | Wt 138.0 lb

## 2012-03-22 DIAGNOSIS — E7800 Pure hypercholesterolemia, unspecified: Secondary | ICD-10-CM

## 2012-03-22 DIAGNOSIS — R0602 Shortness of breath: Secondary | ICD-10-CM

## 2012-03-22 DIAGNOSIS — I2510 Atherosclerotic heart disease of native coronary artery without angina pectoris: Secondary | ICD-10-CM | POA: Insufficient documentation

## 2012-03-22 LAB — LIPID PANEL
Cholesterol: 141 mg/dL (ref 0–200)
HDL: 43.8 mg/dL (ref >39.00)
Total CHOL/HDL Ratio: 3
Triglycerides: 87 mg/dL (ref 0.0–149.0)

## 2012-03-22 MED ORDER — REGADENOSON 0.4 MG/5ML IV SOLN
0.4000 mg | Freq: Once | INTRAVENOUS | Status: AC
  Administered 2012-03-22: 0.4 mg via INTRAVENOUS

## 2012-03-22 MED ORDER — TECHNETIUM TC 99M TETROFOSMIN IV KIT
33.0000 | PACK | Freq: Once | INTRAVENOUS | Status: AC | PRN
  Administered 2012-03-22: 33 via INTRAVENOUS

## 2012-03-22 MED ORDER — TECHNETIUM TC 99M TETROFOSMIN IV KIT
11.0000 | PACK | Freq: Once | INTRAVENOUS | Status: AC | PRN
  Administered 2012-03-22: 11 via INTRAVENOUS

## 2012-03-22 NOTE — Progress Notes (Signed)
[facility] SITE 3 NUCLEAR MED [address] [place] [address] [phone]  Cardiology Nuclear Med Study  Cameron [name] is a 71 y.o. male     MRN : [id number]     DOB: 1941-05-04  Procedure Date: 03/22/2012  Nuclear Med Background Indication for Stress Test:  Evaluation for Ischemia and Stent/Graft Patency History:  '97 Stent-RCA; '06 CABG; '06 Echo:EF=55-65%; '10 ION:GEXBMW, EF=69% Cardiac Risk Factors: Family History - CAD, History of Smoking, Hypertension and Lipids  Symptoms:  DOE, Light-Headedness and Near Syncope   Nuclear Pre-Procedure Caffeine/Decaff Intake:  None NPO After: 7:30pm   Lungs:  Clear. O2 Sat: 98% on room air. IV 0.9% NS with Angio Cath:  20g  IV Site: R Antecubital  IV Started by:  Milana Na, EMT-P  Chest Size (in):  42 Cup Size: n/a  Height: 5\' 8"  (1.727 m)  Weight:  138 lb (62.596 kg)  BMI:  Body mass index is 20.98 kg/(m^2). Tech Comments:  n/a    Nuclear Med Study 1 or 2 day study: 1 day  Stress Test Type:  Lexiscan  Reading MD: Dietrich Pates, MD  Order Authorizing Provider:  Rollene Rotunda, MD  Resting Radionuclide: Technetium 24m Tetrofosmin  Resting Radionuclide Dose: 11.0 mCi   Stress Radionuclide:  Technetium 23m Tetrofosmin  Stress Radionuclide Dose: 33.0 mCi           Stress Protocol Rest HR: 55 Stress HR: 95  Rest BP: 138/96 Stress BP: 198/96  Exercise Time (min): n/a METS: n/a   Predicted Max HR: 150 bpm % Max HR: 63.33 bpm Rate Pressure Product: 41324   Dose of Adenosine (mg):  n/a Dose of Lexiscan: 0.4 mg  Dose of Atropine (mg): n/a Dose of Dobutamine: n/a mcg/kg/min (at max HR)  Stress Test Technologist: Smiley Houseman, CMA-N  Nuclear Technologist:  Domenic Polite, CNMT     Rest Procedure:  Myocardial perfusion imaging was performed at rest 45 minutes following the intravenous administration of Technetium 95m Tetrofosmin.  Rest ECG: Sinus bradycardia, no acute changes.  Stress Procedure:  The  patient received IV Lexiscan 0.4 mg over 15-seconds.  Technetium 31m Tetrofosmin injected at 30-seconds.  There were nonspecific T-wave changes with Lexiscan bolus.  Quantitative spect images were obtained after a 45 minute delay. Stress ECG: No significant change from baseline ECG  QPS Raw Data Images:  Images were motion corrected.  Soft tissue (diaphragm, bowel) underlie heart. Stress Images:  Thinning in the inferior wall, only appreciated in the short axis views.  Otherwise normal perfusion. Rest Images:  Comparison with the stress images reveals no significant change. Subtraction (SDS):  No evidence of ischemia. Transient Ischemic Dilatation (Normal <1.22):  0.96 Lung/Heart Ratio (Normal <0.45):  0.37  Quantitative Gated Spect Images QGS EDV:  69 ml QGS ESV:  23 ml  Impression Exercise Capacity:  Lexiscan with no exercise. BP Response:  Normal blood pressure response. Clinical Symptoms:  No chest pain. ECG Impression:  No significant ST segment change suggestive of ischemia. Comparison with Prior Nuclear Study: No change from previous report.  Overall Impression:  Normal stress nuclear study.  LV Ejection Fraction: 67%.  LV Wall Motion:  NL LV Function; NL Wall Motion  Dietrich Pates

## 2012-03-23 ENCOUNTER — Encounter: Payer: Self-pay | Admitting: *Deleted

## 2012-04-08 ENCOUNTER — Other Ambulatory Visit: Payer: Self-pay

## 2012-04-24 ENCOUNTER — Other Ambulatory Visit: Payer: Self-pay | Admitting: Internal Medicine

## 2012-06-16 ENCOUNTER — Encounter: Payer: Self-pay | Admitting: Cardiology

## 2012-06-16 ENCOUNTER — Ambulatory Visit (INDEPENDENT_AMBULATORY_CARE_PROVIDER_SITE_OTHER): Payer: Medicare Other | Admitting: Cardiology

## 2012-06-16 VITALS — BP 150/90 | HR 75 | Ht 68.0 in | Wt 142.0 lb

## 2012-06-16 DIAGNOSIS — I2510 Atherosclerotic heart disease of native coronary artery without angina pectoris: Secondary | ICD-10-CM

## 2012-06-16 DIAGNOSIS — E785 Hyperlipidemia, unspecified: Secondary | ICD-10-CM

## 2012-06-16 MED ORDER — LOSARTAN POTASSIUM 25 MG PO TABS: ORAL_TABLET | ORAL | Status: DC

## 2012-06-16 NOTE — Progress Notes (Signed)
HPI The patient returned for followup. At the last visit he was describing some dyspnea.  I sent him for a stress perfusion study which showed an LV Ejection Fraction: 67% with normal LV function and wall motion.  Since that visit he says he is done well.  He's not describing any shortness of breath currently. He denies any PND or orthopnea. He's had no chest pressure, neck or arm discomfort. However, he brings a blood pressure diary which demonstrates elevated blood pressures particularly in the morning and less frequently in the evening.   Allergies  Allergen Reactions  . Rosuvastatin     REACTION: aches    Current Outpatient Prescriptions  Medication Sig Dispense Refill  . aspirin 81 MG tablet Take 81 mg by mouth daily.      Marland Kitchen losartan (COZAAR) 25 MG tablet Take 1 tablet (25 mg total) by mouth 2 (two) times daily.  180 tablet  3  . metoprolol succinate (TOPROL-XL) 25 MG 24 hr tablet Take 1 tablet (25 mg total) by mouth daily.  90 tablet  3  . nitroGLYCERIN (NITROSTAT) 0.4 MG SL tablet Place 1 tablet (0.4 mg total) under the tongue every 5 (five) minutes as needed.  25 tablet  11  . omeprazole (PRILOSEC) 20 MG capsule Take 1 capsule (20 mg total) by mouth daily.  90 capsule  3  . pravastatin (PRAVACHOL) 40 MG tablet Take 1 tablet (40 mg total) by mouth daily.  90 tablet  3  . SYMBICORT 160-4.5 MCG/ACT inhaler USE 2 PUFFS BY MOUTH TWICE A DAY  10.2 g  5  . Tamsulosin HCl (FLOMAX) 0.4 MG CAPS Take by mouth daily.      . traMADol (ULTRAM) 50 MG tablet Take 50 mg by mouth every 6 (six) hours as needed.          Past Medical History  Diagnosis Date  . COPD (chronic obstructive pulmonary disease)   . Asthma   . Coronary artery disease     s/p stenting to the right coronary artery with subsequent CABG.  He has LIMA to the LAD,SVG to diagonal, SVG to obtuse marginal, and SVG to PDA,  . Hyperlipidemia   . Osteoarthritis   . GERD (gastroesophageal reflux disease)   . Low back pain      Past Surgical History  Procedure Date  . Coronary artery bypass graft 2006  . Appendectomy   . Hemorrhoid surgery     ROS:  As stated in the history of present illness negative roll of systems.  PHYSICAL EXAM BP 150/90  Pulse 75  Ht 5\' 8"  (1.727 m)  Wt 142 lb (64.411 kg)  BMI 21.59 kg/m2  SpO2 98% GENERAL:  Well appearing NECK:  No jugular venous distention, waveform within normal limits, carotid upstroke brisk and symmetric, no bruits, no thyromegaly LUNGS:  Clear to auscultation bilaterally BACK:  No CVA tenderness CHEST:  Well healed sternotomy scar. HEART:  PMI not displaced or sustained,S1 and S2 within normal limits, no S3, no S4, no clicks, no rubs, no murmurs ABD:  Flat, positive bowel sounds normal in frequency in pitch, no bruits, no rebound, no guarding, no midline pulsatile mass, no hepatomegaly, no splenomegaly EXT:  2 plus pulses throughout, no edema, no cyanosis no clubbing   ASSESSMENT AND PLAN  HTN- He's actually only taking half of the metoprolol. He'll increase this to 25 mg. I'll also increase his Cozaar to 50 mg at night and continue the a.m. dose. He will continue  his blood pressure diary.  CAD -  The patient has no new sypmtoms.  No further cardiovascular testing is indicated.  We will continue with aggressive risk reduction and meds as listed.   DYSPNEA -  This could be an anginal equivalent. He needs a stress test as he has not had one in 2010. He would not be a little walk on a treadmill so he will have a [employer].   HYPERLIPIDEMIA -  He has a goal LDL less than 100 and HDL greater than 40. In May his LDL was 80 and HDL 44.  He will continue the current medications.

## 2012-06-16 NOTE — Patient Instructions (Addendum)
Your physician has recommended you make the following change in your medication: Increase your Toprol to 25mg  (whole tab) daily and Increase your Cozaar to 50mg  in the evening.  Your physician recommends that you schedule a follow-up appointment in: 4 months.

## 2012-07-06 ENCOUNTER — Telehealth: Payer: Self-pay | Admitting: Cardiology

## 2012-07-06 DIAGNOSIS — I2510 Atherosclerotic heart disease of native coronary artery without angina pectoris: Secondary | ICD-10-CM

## 2012-07-06 DIAGNOSIS — E785 Hyperlipidemia, unspecified: Secondary | ICD-10-CM

## 2012-07-06 NOTE — Telephone Encounter (Signed)
Pt was to take his BP and call with those numbers, am average 165/95, then pm was 130/82 pls advise [phone]

## 2012-07-06 NOTE — Telephone Encounter (Signed)
Will send to Dr Antoine Poche for review and recommendations

## 2012-07-12 MED ORDER — LOSARTAN POTASSIUM 50 MG PO TABS: 50.0000 mg | ORAL_TABLET | Freq: Two times a day (BID) | ORAL | Status: DC

## 2012-07-12 NOTE — Telephone Encounter (Signed)
Pt aware to increase Cozaar to 50 mg twice a day.Marland Kitchen He just got 270 tablets of the 25 mg and will take 2 in the AM and 2 in the PM.  He will call when he needs a new RX.  He will keep a BP dairy and call back with results

## 2012-07-12 NOTE — Telephone Encounter (Signed)
Increase the Cozaar to 50 mg bid.  Keep the diary.

## 2012-07-14 ENCOUNTER — Ambulatory Visit (INDEPENDENT_AMBULATORY_CARE_PROVIDER_SITE_OTHER)
Admission: RE | Admit: 2012-07-14 | Discharge: 2012-07-14 | Disposition: A | Discharge status: Home / Self Care | Payer: Medicare Other | Source: Ambulatory Visit | Attending: Internal Medicine | Admitting: Internal Medicine

## 2012-07-14 ENCOUNTER — Ambulatory Visit (INDEPENDENT_AMBULATORY_CARE_PROVIDER_SITE_OTHER): Payer: Medicare Other | Admitting: Internal Medicine

## 2012-07-14 ENCOUNTER — Encounter: Payer: Self-pay | Admitting: Internal Medicine

## 2012-07-14 VITALS — BP 118/68 | HR 78 | Temp 97.2°F | Resp 16 | Wt 142.5 lb

## 2012-07-14 DIAGNOSIS — R05 Cough: Secondary | ICD-10-CM

## 2012-07-14 DIAGNOSIS — J449 Chronic obstructive pulmonary disease, unspecified: Secondary | ICD-10-CM

## 2012-07-14 DIAGNOSIS — J209 Acute bronchitis, unspecified: Secondary | ICD-10-CM

## 2012-07-14 DIAGNOSIS — J984 Other disorders of lung: Secondary | ICD-10-CM

## 2012-07-14 DIAGNOSIS — Z23 Encounter for immunization: Secondary | ICD-10-CM

## 2012-07-14 MED ORDER — MOXIFLOXACIN HCL 400 MG PO TABS: 400.0000 mg | ORAL_TABLET | Freq: Every day | ORAL | Status: AC

## 2012-07-14 MED ORDER — HYDROCODONE-HOMATROPINE 5-1.5 MG/5ML PO SYRP: 5.0000 mL | ORAL_SOLUTION | Freq: Three times a day (TID) | ORAL | Status: AC | PRN

## 2012-07-14 NOTE — Patient Instructions (Signed)

## 2012-07-15 ENCOUNTER — Encounter: Payer: Self-pay | Admitting: Internal Medicine

## 2012-07-15 NOTE — Progress Notes (Signed)
  Subjective:    Patient ID: Cameron [name], male    DOB: 1941-01-30, 71 y.o.   MRN: [id number]  Cough This is a new problem. The current episode started in the past 7 days. The problem has been unchanged. The problem occurs every few hours. The cough is productive of purulent sputum. Associated symptoms include chills, nasal congestion, rhinorrhea, a sore throat and sweats. Pertinent negatives include no chest pain, ear congestion, ear pain, fever, headaches, heartburn, hemoptysis, myalgias, postnasal drip, rash, shortness of breath, weight loss or wheezing. Nothing aggravates the symptoms. He has tried steroid inhaler and a beta-agonist inhaler for the symptoms. The treatment provided mild relief. His past medical history is significant for COPD.      Review of Systems  Constitutional: Positive for chills. Negative for fever, weight loss, diaphoresis, activity change, appetite change, fatigue and unexpected weight change.  HENT: Positive for sore throat and rhinorrhea. Negative for ear pain, congestion, facial swelling, sneezing, drooling, trouble swallowing, voice change, postnasal drip and sinus pressure.   Eyes: Negative.   Respiratory: Positive for cough. Negative for apnea, hemoptysis, choking, chest tightness, shortness of breath, wheezing and stridor.   Cardiovascular: Negative for chest pain, palpitations and leg swelling.  Gastrointestinal: Negative for heartburn, nausea, abdominal pain, diarrhea, constipation and rectal pain.  Genitourinary: Negative.   Musculoskeletal: Negative for myalgias, back pain, joint swelling, arthralgias and gait problem.  Skin: Negative for color change, pallor, rash and wound.  Neurological: Negative.  Negative for headaches.  Hematological: Negative for adenopathy. Does not bruise/bleed easily.  Psychiatric/Behavioral: Negative.        Objective:   Physical Exam  Vitals reviewed. Constitutional: He is oriented to person, place, and time. Vital  signs are normal. He appears well-developed and well-nourished.  Non-toxic appearance. He does not have a sickly appearance. He does not appear ill. No distress.  HENT:  Head: Normocephalic and atraumatic.  Mouth/Throat: Oropharynx is clear and moist. No oropharyngeal exudate.  Eyes: Conjunctivae normal are normal. Right eye exhibits no discharge. Left eye exhibits no discharge. No scleral icterus.  Neck: Normal range of motion. Neck supple. No JVD present. No tracheal deviation present. No thyromegaly present.  Cardiovascular: Normal rate, regular rhythm, normal heart sounds and intact distal pulses.  Exam reveals no gallop and no friction rub.   No murmur heard. Pulmonary/Chest: Effort normal. No accessory muscle usage or stridor. Not tachypneic. No respiratory distress. He has no decreased breath sounds. He has no wheezes. He has rhonchi in the right middle field and the left middle field. He has no rales. He exhibits no tenderness.  Abdominal: Soft. Bowel sounds are normal. He exhibits no distension and no mass. There is no tenderness. There is no rebound and no guarding.  Musculoskeletal: Normal range of motion. He exhibits no edema and no tenderness.  Lymphadenopathy:    He has no cervical adenopathy.  Neurological: He is oriented to person, place, and time.  Skin: Skin is warm and dry. No rash noted. He is not diaphoretic. No erythema. No pallor.  Psychiatric: He has a normal mood and affect. His behavior is normal. Judgment and thought content normal.          Assessment & Plan:

## 2012-07-15 NOTE — Assessment & Plan Note (Signed)
I will recheck his CXR today 

## 2012-07-15 NOTE — Assessment & Plan Note (Signed)
He will start avelox to treat the infection and a cough suppressant

## 2012-07-15 NOTE — Assessment & Plan Note (Signed)
Repeat CXR today 

## 2012-07-15 NOTE — Assessment & Plan Note (Signed)
Start avelox 

## 2012-09-07 ENCOUNTER — Telehealth: Payer: Self-pay | Admitting: Cardiology

## 2012-09-07 NOTE — Telephone Encounter (Signed)
Walk In pt Form " Pt Dropped Off BP Readings" sent to [facility] 09/07/12/KM

## 2012-09-09 ENCOUNTER — Telehealth: Payer: Self-pay | Admitting: *Deleted

## 2012-09-09 MED ORDER — METOPROLOL SUCCINATE ER 50 MG PO TB24: 50.0000 mg | ORAL_TABLET | Freq: Every day | ORAL | Status: DC

## 2012-09-09 NOTE — Telephone Encounter (Signed)
Received BP results from pt which Dr Antoine Poche reviewed.  He would like for the pt to increase his toprol to 50 mg a day.  rx sent into [phone] [id number] X [phone]

## 2012-09-28 ENCOUNTER — Other Ambulatory Visit (INDEPENDENT_AMBULATORY_CARE_PROVIDER_SITE_OTHER): Payer: Medicare Other

## 2012-09-28 ENCOUNTER — Encounter: Payer: Self-pay | Admitting: Physician Assistant

## 2012-09-28 ENCOUNTER — Other Ambulatory Visit: Payer: Self-pay | Admitting: *Deleted

## 2012-09-28 ENCOUNTER — Telehealth: Payer: Self-pay | Admitting: *Deleted

## 2012-09-28 ENCOUNTER — Other Ambulatory Visit: Payer: Self-pay | Admitting: Internal Medicine

## 2012-09-28 ENCOUNTER — Telehealth: Payer: Self-pay | Admitting: Internal Medicine

## 2012-09-28 ENCOUNTER — Ambulatory Visit (INDEPENDENT_AMBULATORY_CARE_PROVIDER_SITE_OTHER): Payer: Medicare Other | Admitting: Physician Assistant

## 2012-09-28 VITALS — BP 106/68 | HR 60 | Ht 68.0 in | Wt 141.6 lb

## 2012-09-28 DIAGNOSIS — A09 Infectious gastroenteritis and colitis, unspecified: Secondary | ICD-10-CM

## 2012-09-28 DIAGNOSIS — R197 Diarrhea, unspecified: Secondary | ICD-10-CM

## 2012-09-28 DIAGNOSIS — R112 Nausea with vomiting, unspecified: Secondary | ICD-10-CM

## 2012-09-28 LAB — CBC WITH DIFFERENTIAL/PLATELET
Basophils Absolute: 0 10*3/uL (ref 0.0–0.1)
Eosinophils Relative: 0.3 % (ref 0.0–5.0)
Lymphocytes Relative: 20.8 % (ref 12.0–46.0)
Monocytes Relative: 11 % (ref 3.0–12.0)
Neutrophils Relative %: 67.7 % (ref 43.0–77.0)
Platelets: 212 10*3/uL (ref 150.0–400.0)
RDW: 13.3 % (ref 11.5–14.6)
WBC: 10.3 10*3/uL (ref 4.5–10.5)

## 2012-09-28 LAB — BASIC METABOLIC PANEL
CO2: 29 mEq/L (ref 19–32)
Glucose, Bld: 111 mg/dL — ABNORMAL HIGH (ref 70–99)
Potassium: 4.1 mEq/L (ref 3.5–5.1)
Sodium: 137 mEq/L (ref 135–145)

## 2012-09-28 MED ORDER — SACCHAROMYCES BOULARDII 250 MG PO CAPS: 250.0000 mg | ORAL_CAPSULE | Freq: Two times a day (BID) | ORAL | Status: DC

## 2012-09-28 MED ORDER — METRONIDAZOLE 250 MG PO TABS: ORAL_TABLET | ORAL | Status: AC

## 2012-09-28 NOTE — Telephone Encounter (Signed)
Pts wife Cameron [name] has Cdiff. Cameron [name] was up all night with vomiting and diarrhea. Pt spent most of the night laying on the bathroom floor. Cameron [name] states he is sleeping now. Requesting Flagyl be called in for Cameron [name]. Dr. Marina Goodell please advise.

## 2012-09-28 NOTE — Progress Notes (Signed)
Subjective:    Patient ID: Cameron [name], male    DOB: [birth year], 71 y.o.   MRN: [id number]  HPI Cameron [name] is a pleasant 71 year old white male known to Dr. Marina Goodell. He has history of COPD, coronary artery disease status post CABG, hyperlipidemia and chronic GERD. He had colonoscopy in 2007 which was normal. His wife is also a patient of Dr. Marina Goodell and is currently being treated for recurrent C. difficile and apparently also has a new diagnosis of ulcerative colitis. Patient comes in today as an urgent work in with complaints of abrupt onset of nausea and vomiting and diarrhea last evening. He says he felt horrible all light and actually laid on the bathroom floor most of the night because he had to keep getting up or vomiting or diarrhea. He says his diarrhea was watery and nonbloody and malodorous. He has had abdominal cramping but no severe pain. He has not had any documented fever but has had chills. He says he had at least 4-5 episodes of diarrhea during the night and since he has gotten up today has not had any further diarrhea but feels quite weak. He has not had anything to eat or drink today because he was afraid to try. Patient has not had any prior problems with diarrhea, he did take a course of Avelox in September for bronchitis,, he and his wife also share the same bathroom etc.    Review of Systems  Constitutional: Positive for chills, activity change, appetite change and fatigue.  HENT: Negative.   Eyes: Negative.   Respiratory: Negative.   Cardiovascular: Negative.   Gastrointestinal: Positive for nausea, vomiting, abdominal pain and diarrhea.  Genitourinary: Negative.   Musculoskeletal: Negative.   Neurological: Negative.   Hematological: Negative.   Psychiatric/Behavioral: Negative.    Outpatient Prescriptions Prior to Visit  Medication Sig Dispense Refill  . aspirin 81 MG tablet Take 81 mg by mouth daily.      Marland Kitchen losartan (COZAAR) 50 MG tablet Take 1 tablet (50 mg total) by  mouth 2 (two) times daily.      . metoprolol succinate (TOPROL-XL) 50 MG 24 hr tablet Take 1 tablet (50 mg total) by mouth daily.  90 tablet  3  . metoprolol succinate (TOPROL-XL) 50 MG 24 hr tablet Take 1 tablet (50 mg total) by mouth daily.  90 tablet  3  . nitroGLYCERIN (NITROSTAT) 0.4 MG SL tablet Place 1 tablet (0.4 mg total) under the tongue every 5 (five) minutes as needed.  25 tablet  11  . omeprazole (PRILOSEC) 20 MG capsule Take 1 capsule (20 mg total) by mouth daily.  90 capsule  3  . pravastatin (PRAVACHOL) 40 MG tablet Take 1 tablet (40 mg total) by mouth daily.  90 tablet  3  . SYMBICORT 160-4.5 MCG/ACT inhaler USE 2 PUFFS BY MOUTH TWICE A DAY  10.2 g  5  . Tamsulosin HCl (FLOMAX) 0.4 MG CAPS Take by mouth daily.      . traMADol (ULTRAM) 50 MG tablet Take 50 mg by mouth every 6 (six) hours as needed.         Allergies  Allergen Reactions  . Rosuvastatin     REACTION: aches   Patient Active Problem List  Diagnosis  . HYPERLIPIDEMIA  . CORONARY ARTERY DISEASE  . Acute Upper Respiratory Infections of Unspecified Site  . Chronic airway obstruction, not elsewhere classified  . LUNG NODULE  . GERD  . ACTINIC KERATOSIS  . OSTEOARTHRITIS  . LOW BACK  PAIN  . FATIGUE  . RASH AND OTHER NONSPECIFIC SKIN ERUPTION  . HOARSENESS  . SHORTNESS OF BREATH (SOB)  . DYSPNEA  . COUGH  . CHEST PAIN, UNSPECIFIED  . Nonspecific (abnormal) findings on radiological and other examination of body structure  . ABNORMAL CHEST XRAY  . INSOMNIA, CHRONIC  . BACK PAIN, THORACIC REGION  . CAROTID BRUIT  . NECK PAIN  . NAUSEA  . Acute bronchitis  . Pharyngitis, acute   History  Substance Use Topics  . Smoking status: Former Smoker -- 1.0 packs/day for 40 years    Types: Cigarettes    Quit date: 11/04/2003  . Smokeless tobacco: Never Used  . Alcohol Use: No       Objective:   Physical Exam well-developed older white male in no acute distress, accompanied by his wife blood pressure  106/68 pulse 60 height 5 foot 8 weight 141. HEENT; nontraumatic normocephalic EOMI PERRLA sclera anicteric,Neck; Supple no JVD, Cardiovascular; regular rate and rhythm with S1-S2, Pulmonary;somewhat decreased breath sounds bilaterally, Abdomen; soft there is no focal tenderness no guarding, or rebound, no palpable mass, or hepatosplenomegaly bowel sounds are active, Rectal; exam not done, Extremities; no clubbing cyanosis or edema skin warm and dry, Psych; mood and affect normal and appropriate.        Assessment & Plan:  #58 71 year old male with acute illness onset within the past 24 hours with nausea vomiting abdominal cramping and diarrhea which has been watery malodorous. This is consistent with an infectious gastroenteritis or colitis. His wife is currently being treated for C. difficile, and he certainly needs to be ruled out for C. difficile as well.  #2 COPD #3 coronary artery disease status post CABG  Plan; CBC with differential and bmet  today Stool for C. difficile by PCR Will empirically start Flagyl 250 mg by mouth 4 times daily x14 days Start florastor  one by mouth twice daily x14 days Patient encouraged to rest at home and push fluids over the next 24-48 hours. He is to start with a liquid diet and gradually work to a soft bland diet. He is advised to call should his symptoms worsen over the next few days Offered an anti-emetic, he has Phenergan to use at home when necessary

## 2012-09-28 NOTE — Patient Instructions (Addendum)
Before leaving today, go to the lab and either leave the stool sample for the CDiff test or the lab personnel can given you the stool cups to take home with the instructions.  We sent prescriptions for Flagyl ( Metronidazole) to [facility]. Also Florastor tablets.  Push fluids. Eat a soft bland diet. Call us if your symptoms worsen.

## 2012-09-28 NOTE — Telephone Encounter (Signed)
Needs to be seen today by extender

## 2012-09-28 NOTE — Telephone Encounter (Signed)
Pt scheduled to see Amy Esterwood PA today at 2:30pm. Pt aware of appt date and time. 

## 2012-09-28 NOTE — Telephone Encounter (Signed)
Re-routed to [facility] - appropriate for triage

## 2012-09-28 NOTE — Telephone Encounter (Signed)
Patient was laying down.  He had been up a lot last night vomiting and diarrhea.  I advised his wife to please have him come to our lab at 2Pm before his appt with Mike Gip PA at 2;30 PM .

## 2012-09-29 ENCOUNTER — Other Ambulatory Visit: Payer: Medicare Other

## 2012-09-29 DIAGNOSIS — R197 Diarrhea, unspecified: Secondary | ICD-10-CM

## 2012-09-29 NOTE — Progress Notes (Signed)
Agree with assessment and plans 

## 2012-10-11 ENCOUNTER — Ambulatory Visit (INDEPENDENT_AMBULATORY_CARE_PROVIDER_SITE_OTHER): Payer: Medicare Other | Admitting: Cardiology

## 2012-10-11 ENCOUNTER — Encounter: Payer: Self-pay | Admitting: Cardiology

## 2012-10-11 VITALS — BP 120/70 | HR 50 | Ht 68.0 in | Wt 143.8 lb

## 2012-10-11 DIAGNOSIS — E785 Hyperlipidemia, unspecified: Secondary | ICD-10-CM

## 2012-10-11 DIAGNOSIS — R079 Chest pain, unspecified: Secondary | ICD-10-CM

## 2012-10-11 DIAGNOSIS — R0989 Other specified symptoms and signs involving the circulatory and respiratory systems: Secondary | ICD-10-CM

## 2012-10-11 DIAGNOSIS — I2510 Atherosclerotic heart disease of native coronary artery without angina pectoris: Secondary | ICD-10-CM

## 2012-10-11 MED ORDER — HYDROCHLOROTHIAZIDE 12.5 MG PO CAPS: 12.5000 mg | ORAL_CAPSULE | Freq: Every day | ORAL | Status: DC

## 2012-10-11 MED ORDER — METOPROLOL SUCCINATE ER 50 MG PO TB24: 50.0000 mg | ORAL_TABLET | Freq: Every day | ORAL | Status: DC

## 2012-10-11 MED ORDER — LOSARTAN POTASSIUM 50 MG PO TABS: 50.0000 mg | ORAL_TABLET | Freq: Two times a day (BID) | ORAL | Status: DC

## 2012-10-11 NOTE — Progress Notes (Signed)
HPI The patient returned for followup of his BP.  He had a perfusion study earlier this year which showed an LV Ejection Fraction: 67% with normal LV function and wall motion.  At the last visit I increased his ACE inhibitor as his blood pressure was not well controlled. Over the phone his blood pressures were still high and so I increased his beta blocker. He did well with these. He's not had any presyncope or syncope. The patient denies any new symptoms such as chest discomfort, neck or arm discomfort. There has been no new shortness of breath, PND or orthopnea. There have been no reported palpitations, presyncope or syncope.  He does bring a blood pressure diary which demonstrates that his systolics are typically slightly elevated in the 140s to 150s.   Allergies  Allergen Reactions  . Rosuvastatin     REACTION: aches    Current Outpatient Prescriptions  Medication Sig Dispense Refill  . aspirin 81 MG tablet Take 81 mg by mouth daily.      Marland Kitchen losartan (COZAAR) 50 MG tablet Take 1 tablet (50 mg total) by mouth 2 (two) times daily.      . metoprolol succinate (TOPROL-XL) 50 MG 24 hr tablet Take 1 tablet (50 mg total) by mouth daily.  90 tablet  3  . nitroGLYCERIN (NITROSTAT) 0.4 MG SL tablet Place 1 tablet (0.4 mg total) under the tongue every 5 (five) minutes as needed.  25 tablet  11  . omeprazole (PRILOSEC) 20 MG capsule Take 1 capsule (20 mg total) by mouth daily.  90 capsule  3  . pravastatin (PRAVACHOL) 40 MG tablet Take 1 tablet (40 mg total) by mouth daily.  90 tablet  3  . SYMBICORT 160-4.5 MCG/ACT inhaler USE 2 PUFFS BY MOUTH TWICE A DAY  10.2 g  5  . Tamsulosin HCl (FLOMAX) 0.4 MG CAPS Take by mouth daily.      . traMADol (ULTRAM) 50 MG tablet Take 50 mg by mouth every 6 (six) hours as needed.        . metoprolol succinate (TOPROL-XL) 50 MG 24 hr tablet Take 1 tablet (50 mg total) by mouth daily.  90 tablet  3  . metroNIDAZOLE (FLAGYL) 250 MG tablet Take 1 tab 4 times daily.   56 tablet  0  . saccharomyces boulardii (FLORASTOR) 250 MG capsule Take 1 capsule (250 mg total) by mouth 2 (two) times daily.  28 capsule  0    Past Medical History  Diagnosis Date  . COPD (chronic obstructive pulmonary disease)   . Asthma   . Coronary artery disease     s/p stenting to the right coronary artery with subsequent CABG.  He has LIMA to the LAD,SVG to diagonal, SVG to obtuse marginal, and SVG to PDA,  . Hyperlipidemia   . Osteoarthritis   . GERD (gastroesophageal reflux disease)   . Low back pain     Past Surgical History  Procedure Date  . Coronary artery bypass graft 2006  . Appendectomy   . Hemorrhoid surgery     ROS:  As stated in the history of present illness negative roll of systems.  PHYSICAL EXAM BP 120/70  Pulse 50  Ht 5\' 8"  (1.727 m)  Wt 143 lb 12.8 oz (65.227 kg)  BMI 21.86 kg/m2 GENERAL:  Well appearing NECK:  No jugular venous distention, waveform within normal limits, carotid upstroke brisk and symmetric, no bruits, no thyromegaly LUNGS:  Clear to auscultation bilaterally BACK:  No  CVA tenderness CHEST:  Well healed sternotomy scar. HEART:  PMI not displaced or sustained,S1 and S2 within normal limits, no S3, no S4, no clicks, no rubs, no murmurs ABD:  Flat, positive bowel sounds normal in frequency in pitch, no bruits, no rebound, no guarding, no midline pulsatile mass, no hepatomegaly, no splenomegaly EXT:  2 plus pulses throughout, no edema, no cyanosis no clubbing  EKG: Sinus bradycardia, rate 50, axis within normal limits, intervals within normal limits, no acute ST-T wave changes.  10/11/2012  ASSESSMENT AND PLAN  HTN- Today I am going to add a low dose of HCTZ. We discussed potassium containing foods to increase. He will continue with his blood pressure diary.  CAD -  The patient has no new sypmtoms since his stress perfusion study earlier this year..  No further cardiovascular testing is indicated.  We will continue with aggressive  risk reduction and meds as listed.   HYPERLIPIDEMIA -  He has a goal LDL less than 100 and HDL greater than 40. In May his LDL was 80 and HDL 44.  He will continue the current medications.

## 2012-10-11 NOTE — Patient Instructions (Addendum)
Please start HCTZ 12.5 mg a day. Continue all other medications as listed  Follow up in 6 months with Dr Antoine Poche.  You will receive a letter in the mail 2 months before you are due.  Please call us when you receive this letter to schedule your follow up appointment.

## 2012-11-25 ENCOUNTER — Other Ambulatory Visit: Payer: Self-pay | Admitting: Rehabilitation

## 2012-11-25 DIAGNOSIS — M549 Dorsalgia, unspecified: Secondary | ICD-10-CM

## 2012-11-29 ENCOUNTER — Ambulatory Visit
Admission: RE | Admit: 2012-11-29 | Discharge: 2012-11-29 | Disposition: A | Discharge status: Home / Self Care | Payer: Medicare Other | Source: Ambulatory Visit | Attending: Rehabilitation | Admitting: Rehabilitation

## 2012-11-29 ENCOUNTER — Other Ambulatory Visit: Payer: Self-pay | Admitting: Rehabilitation

## 2012-11-29 DIAGNOSIS — IMO0002 Reserved for concepts with insufficient information to code with codable children: Secondary | ICD-10-CM

## 2012-11-29 DIAGNOSIS — M549 Dorsalgia, unspecified: Secondary | ICD-10-CM

## 2013-01-03 ENCOUNTER — Telehealth: Payer: Self-pay | Admitting: Internal Medicine

## 2013-01-03 MED ORDER — NORTRIPTYLINE HCL 10 MG PO CAPS: 10.0000 mg | ORAL_CAPSULE | Freq: Every day | ORAL | Status: DC

## 2013-01-03 NOTE — Telephone Encounter (Signed)
Needs a mild sleeping pill--Nortriptyline Rx Done

## 2013-01-04 NOTE — Telephone Encounter (Signed)
Pts wife informed.

## 2013-01-13 ENCOUNTER — Telehealth: Payer: Self-pay | Admitting: Cardiology

## 2013-01-13 NOTE — Telephone Encounter (Signed)
New Problem:    Patient's wife called in because for the past week the patient has been feeling dizzy upon standing and is fatigued.  Vital signs looked normal.  Please call back.

## 2013-01-13 NOTE — Telephone Encounter (Signed)
Per wife - pt has been acting different.  Doesn't want to do anything.  Says he has no energy and is weak.  Per wife report he feels clammy sometimes.  His BP, HR and O2 sat are all "OK".  He denies any fever, chills, chest pain or cough.  Pt had requested something to help him sleep and has not started it yet as it just came in the mail today.  Wife will call his PCP for an appt for evaluation.  He is scheduled to see Dr Antoine Poche 01/25/2013.  They will call back prior to them if further concerns.

## 2013-01-14 ENCOUNTER — Ambulatory Visit (INDEPENDENT_AMBULATORY_CARE_PROVIDER_SITE_OTHER)
Admission: RE | Admit: 2013-01-14 | Discharge: 2013-01-14 | Disposition: A | Discharge status: Home / Self Care | Payer: Medicare Other | Source: Ambulatory Visit | Attending: Internal Medicine | Admitting: Internal Medicine

## 2013-01-14 ENCOUNTER — Encounter: Payer: Self-pay | Admitting: Internal Medicine

## 2013-01-14 ENCOUNTER — Other Ambulatory Visit (INDEPENDENT_AMBULATORY_CARE_PROVIDER_SITE_OTHER): Payer: Medicare Other

## 2013-01-14 ENCOUNTER — Ambulatory Visit (INDEPENDENT_AMBULATORY_CARE_PROVIDER_SITE_OTHER): Payer: Medicare Other | Admitting: Internal Medicine

## 2013-01-14 VITALS — BP 120/80 | HR 80 | Temp 97.9°F | Resp 16 | Wt 140.0 lb

## 2013-01-14 DIAGNOSIS — R5383 Other fatigue: Secondary | ICD-10-CM

## 2013-01-14 DIAGNOSIS — R5381 Other malaise: Secondary | ICD-10-CM

## 2013-01-14 DIAGNOSIS — R930 Abnormal findings on diagnostic imaging of skull and head, not elsewhere classified: Secondary | ICD-10-CM

## 2013-01-14 DIAGNOSIS — R202 Paresthesia of skin: Secondary | ICD-10-CM

## 2013-01-14 DIAGNOSIS — R209 Unspecified disturbances of skin sensation: Secondary | ICD-10-CM

## 2013-01-14 DIAGNOSIS — J4489 Other specified chronic obstructive pulmonary disease: Secondary | ICD-10-CM

## 2013-01-14 DIAGNOSIS — R0602 Shortness of breath: Secondary | ICD-10-CM

## 2013-01-14 DIAGNOSIS — I2510 Atherosclerotic heart disease of native coronary artery without angina pectoris: Secondary | ICD-10-CM

## 2013-01-14 DIAGNOSIS — J449 Chronic obstructive pulmonary disease, unspecified: Secondary | ICD-10-CM

## 2013-01-14 LAB — BASIC METABOLIC PANEL
BUN: 43 mg/dL — ABNORMAL HIGH (ref 6–23)
Chloride: 108 mEq/L (ref 96–112)
GFR: 40.24 mL/min — ABNORMAL LOW (ref >60.00)
Glucose, Bld: 113 mg/dL — ABNORMAL HIGH (ref 70–99)
Potassium: 5.2 mEq/L — ABNORMAL HIGH (ref 3.5–5.1)
Sodium: 139 mEq/L (ref 135–145)

## 2013-01-14 LAB — CBC WITH DIFFERENTIAL/PLATELET
Eosinophils Relative: 0.6 % (ref 0.0–5.0)
HCT: 46.4 % (ref 39.0–52.0)
Hemoglobin: 15.7 g/dL (ref 13.0–17.0)
Lymphs Abs: 3.7 10*3/uL (ref 0.7–4.0)
MCV: 96.1 fl (ref 78.0–100.0)
Monocytes Absolute: 1.2 10*3/uL — ABNORMAL HIGH (ref 0.1–1.0)
Monocytes Relative: 8.6 % (ref 3.0–12.0)
Neutro Abs: 9.4 10*3/uL — ABNORMAL HIGH (ref 1.4–7.7)
Platelets: 243 10*3/uL (ref 150.0–400.0)
WBC: 14.4 10*3/uL — ABNORMAL HIGH (ref 4.5–10.5)

## 2013-01-14 LAB — URINALYSIS
Ketones, ur: NEGATIVE
Specific Gravity, Urine: >=1.03 (ref 1.000–1.030)
Urine Glucose: NEGATIVE
pH: 5.5 (ref 5.0–8.0)

## 2013-01-14 LAB — VITAMIN B12: Vitamin B-12: 324 pg/mL (ref 211–911)

## 2013-01-14 MED ORDER — METOPROLOL SUCCINATE ER 50 MG PO TB24: 25.0000 mg | ORAL_TABLET | Freq: Every day | ORAL | Status: DC

## 2013-01-14 MED ORDER — VITAMIN D 1000 UNITS PO TABS: 1000.0000 [IU] | ORAL_TABLET | Freq: Every day | ORAL | Status: DC

## 2013-01-14 NOTE — Assessment & Plan Note (Signed)
Continue with current prescription therapy as reflected on the Med list.  

## 2013-01-14 NOTE — Assessment & Plan Note (Signed)
Labs

## 2013-01-14 NOTE — Progress Notes (Signed)
   Subjective:     HPI  C/o fatigue x 3-4 wks, dizzines, nausea  -- a lot more lately. He is taking Toprol at lunch, more sx's in pm C/o DOE. No CP  Review of Systems  Constitutional: Positive for fatigue. Negative for fever, appetite change and unexpected weight change.  HENT: Negative for nosebleeds, congestion, sore throat, sneezing, trouble swallowing and neck pain.   Eyes: Negative for itching and visual disturbance.  Respiratory: Positive for shortness of breath. Negative for cough.   Cardiovascular: Negative for chest pain, palpitations and leg swelling.  Gastrointestinal: Negative for nausea, diarrhea, blood in stool and abdominal distention.  Genitourinary: Negative for dysuria, urgency, frequency and hematuria.  Musculoskeletal: Negative for back pain, joint swelling and gait problem.  Skin: Negative for rash.  Neurological: Positive for dizziness and light-headedness. Negative for tremors, speech difficulty and weakness.  Psychiatric/Behavioral: Negative for sleep disturbance, self-injury, dysphoric mood and agitation. The patient is not nervous/anxious.        Objective:   Physical Exam  Constitutional: He is oriented to person, place, and time. He appears well-developed.  HENT:  Mouth/Throat: Oropharynx is clear and moist.  Eyes: Conjunctivae are normal. Pupils are equal, round, and reactive to light.  Neck: Normal range of motion. No JVD present. No thyromegaly present.  Cardiovascular: Normal rate, regular rhythm, normal heart sounds and intact distal pulses.  Exam reveals no gallop and no friction rub.   No murmur heard. Pulmonary/Chest: Effort normal and breath sounds normal. No respiratory distress. He has no wheezes. He has no rales. He exhibits no tenderness.  Abdominal: Soft. Bowel sounds are normal. He exhibits no distension and no mass. There is no tenderness. There is no rebound and no guarding.  Musculoskeletal: Normal range of motion. He exhibits no edema  and no tenderness.  Lymphadenopathy:    He has no cervical adenopathy.  Neurological: He is alert and oriented to person, place, and time. He has normal reflexes. No cranial nerve deficit. He exhibits normal muscle tone. Coordination normal.  Skin: Skin is warm and dry. No rash noted.  Psychiatric: He has a normal mood and affect. His behavior is normal. Judgment and thought content normal.    I personally provided the Symbicort inhaler use teaching. After the teaching patient was able to demonstrate it's use effectively. All questions were answered  Lab Results  Component Value Date   WBC 10.3 09/28/2012   HGB 14.8 09/28/2012   HCT 45.5 09/28/2012   PLT 212.0 09/28/2012   GLUCOSE 111* 09/28/2012   CHOL 141 03/22/2012   TRIG 87.0 03/22/2012   HDL 43.80 03/22/2012   LDLCALC 80 03/22/2012   ALT 16 06/19/2010   AST 20 06/19/2010   NA 137 09/28/2012   K 4.1 09/28/2012   CL 103 09/28/2012   CREATININE 1.4 09/28/2012   BUN 23 09/28/2012   CO2 29 09/28/2012   TSH 1.67 01/30/2009   PSA 1.45 02/19/2007        Assessment & Plan:

## 2013-01-14 NOTE — Assessment & Plan Note (Signed)
Repeat CXR

## 2013-01-14 NOTE — Patient Instructions (Signed)
Take Toprol 1/2  Day at night

## 2013-01-14 NOTE — Assessment & Plan Note (Signed)
Chronic  3/14 DOE CXR

## 2013-01-18 ENCOUNTER — Telehealth: Payer: Self-pay | Admitting: Internal Medicine

## 2013-01-18 NOTE — Telephone Encounter (Signed)
Cameron [name], please, inform patient that all labs are normal except for some evidence of dehydration. Drink more water. Stop Microzide 12.5 m if  Not feeling better. RTC 1 mo w/BMET Th

## 2013-01-18 NOTE — Telephone Encounter (Signed)
Pt informed

## 2013-01-22 ENCOUNTER — Other Ambulatory Visit: Payer: Self-pay | Admitting: Internal Medicine

## 2013-01-25 ENCOUNTER — Ambulatory Visit (INDEPENDENT_AMBULATORY_CARE_PROVIDER_SITE_OTHER): Payer: Medicare Other | Admitting: Cardiology

## 2013-01-25 ENCOUNTER — Encounter: Payer: Self-pay | Admitting: Cardiology

## 2013-01-25 VITALS — BP 115/79 | HR 52 | Ht 68.0 in | Wt 142.8 lb

## 2013-01-25 DIAGNOSIS — I2510 Atherosclerotic heart disease of native coronary artery without angina pectoris: Secondary | ICD-10-CM

## 2013-01-25 MED ORDER — METOPROLOL SUCCINATE ER 25 MG PO TB24: 25.0000 mg | ORAL_TABLET | Freq: Every day | ORAL | Status: DC

## 2013-01-25 NOTE — Progress Notes (Signed)
HPI The patient returned for followup of dizziness and weakness. He has a history of coronary artery disease as described below..  He had a perfusion study earlier this year which showed an LV Ejection Fraction: 67% with normal LV function and wall motion. When I was seeing him last year his blood pressures have been creeping up. I actually added hydrochlorothiazide to his regimen. However, a few months ago he started noticing increasing leg weakness and fatigue. Subsequently saw his primary provider who drew labs demonstrating that his potassium was mildly elevated. His BUN was 43 with a creatinine of 1.8. He was told to stop his hydrochlorothiazide which he did. He also reduced his beta blocker and ARB dosing in half though he's now back up old dose. Has intermittently noticed that his blood pressure might be down as low as 80 at times. However, other times it's in the one teens and occasionally as high as 130 systolic. He's felt better he stop the HCTZ. He's not had any presyncope or syncope. He denies any chest pressure, neck or arm discomfort. He does have leg cramping.  Allergies  Allergen Reactions  . Rosuvastatin     REACTION: aches  . Zolpidem     confusion    Current Outpatient Prescriptions  Medication Sig Dispense Refill  . aspirin 81 MG tablet Take 81 mg by mouth daily.      . cholecalciferol (VITAMIN D) 1000 UNITS tablet Take 1 tablet (1,000 Units total) by mouth daily.  100 tablet  3  . hydrochlorothiazide (MICROZIDE) 12.5 MG capsule Take 1 capsule (12.5 mg total) by mouth daily.  90 capsule  3  . losartan (COZAAR) 50 MG tablet Take 1 tablet (50 mg total) by mouth 2 (two) times daily.  180 tablet  3  . metoprolol succinate (TOPROL-XL) 50 MG 24 hr tablet Take 1 tablet (50 mg total) by mouth daily.  30 tablet  11  . nitroGLYCERIN (NITROSTAT) 0.4 MG SL tablet Place 1 tablet (0.4 mg total) under the tongue every 5 (five) minutes as needed.  25 tablet  11  . nortriptyline (PAMELOR)  10 MG capsule Take 1-2 capsules (10-20 mg total) by mouth at bedtime.  60 capsule  5  . omeprazole (PRILOSEC) 20 MG capsule Take 1 capsule (20 mg total) by mouth daily.  90 capsule  3  . pravastatin (PRAVACHOL) 40 MG tablet Take 1 tablet (40 mg total) by mouth daily.  90 tablet  3  . SYMBICORT 160-4.5 MCG/ACT inhaler INHALE 2 PUFFS BY MOUTH TWICE A DAY  10.2 g  5  . Tamsulosin HCl (FLOMAX) 0.4 MG CAPS Take by mouth daily.      . traMADol (ULTRAM) 50 MG tablet Take 50 mg by mouth every 6 (six) hours as needed.         No current facility-administered medications for this visit.    Past Medical History  Diagnosis Date  . COPD (chronic obstructive pulmonary disease)   . Asthma   . Coronary artery disease     s/p stenting to the right coronary artery with subsequent CABG.  He has LIMA to the LAD,SVG to diagonal, SVG to obtuse marginal, and SVG to PDA,  . Hyperlipidemia   . Osteoarthritis   . GERD (gastroesophageal reflux disease)   . Low back pain     Past Surgical History  Procedure Laterality Date  . Coronary artery bypass graft  2006  . Appendectomy    . Hemorrhoid surgery  ROS:  As stated in the history of present illness negative roll of systems.  PHYSICAL EXAM BP 115/79  Pulse 52  Ht 5\' 8"  (1.727 m)  Wt 142 lb 12.8 oz (64.774 kg)  BMI 21.72 kg/m2 GENERAL:  Well appearing NECK:  No jugular venous distention, waveform within normal limits, carotid upstroke brisk and symmetric, no bruits, no thyromegaly LUNGS:  Clear to auscultation bilaterally BACK:  No CVA tenderness CHEST:  Well healed sternotomy scar. HEART:  PMI not displaced or sustained,S1 and S2 within normal limits, no S3, no S4, no clicks, no rubs, no murmurs ABD:  Flat, positive bowel sounds normal in frequency in pitch, no bruits, no rebound, no guarding, no midline pulsatile mass, no hepatomegaly, no splenomegaly EXT:  2 plus pulses throughout, no edema, no cyanosis no clubbing  EKG: Sinus bradycardia,  rate 52, axis within normal limits, intervals within normal limits, no acute ST-T wave changes.  01/25/2013  ASSESSMENT AND PLAN  HTN- Today I will decrease his beta blocker back down to 25 mg daily. If he has continued low blood pressures in the future I'll reduce his HCTZ again. Of note I did review his labs and he was not anemic. TSH was normal. No other etiology for the fallen blood pressure is yet identified.  CAD -  The patient has no new sypmtoms since his stress perfusion study earlier last year.  No further cardiovascular testing is indicated.  We will continue with aggressive risk reduction and meds as listed.  HYPERLIPIDEMIA -  He is not really at target dose of statin. However, his lipids have been so well controlled but I don't see an indication to change.

## 2013-01-25 NOTE — Patient Instructions (Addendum)
Please decrease your Metoprolol to 25 mg a day Continue all other medications as listed  Follow in with Dr Antoine Poche in 4 months

## 2013-01-26 ENCOUNTER — Ambulatory Visit (INDEPENDENT_AMBULATORY_CARE_PROVIDER_SITE_OTHER): Payer: Medicare Other | Admitting: Internal Medicine

## 2013-01-26 ENCOUNTER — Encounter: Payer: Self-pay | Admitting: Internal Medicine

## 2013-01-26 VITALS — BP 110/80 | HR 64 | Temp 97.5°F | Resp 16 | Wt 141.0 lb

## 2013-01-26 DIAGNOSIS — R509 Fever, unspecified: Secondary | ICD-10-CM

## 2013-01-26 DIAGNOSIS — N419 Inflammatory disease of prostate, unspecified: Secondary | ICD-10-CM | POA: Insufficient documentation

## 2013-01-26 DIAGNOSIS — R5383 Other fatigue: Secondary | ICD-10-CM

## 2013-01-26 MED ORDER — CIPROFLOXACIN HCL 500 MG PO TABS: 500.0000 mg | ORAL_TABLET | Freq: Two times a day (BID) | ORAL | Status: DC

## 2013-01-26 NOTE — Progress Notes (Signed)
Patient ID: Cameron [name], male   DOB: April 27, 1941, 72 y.o.   MRN: [id number]   Subjective:     Fever  This is a new problem. The current episode started today (3 am). The problem has been unchanged. The maximum temperature noted was 100 to 100.9 F. The temperature was taken using an oral thermometer. Pertinent negatives include no congestion, diarrhea, rash or sore throat. He has tried acetaminophen for the symptoms. The treatment provided mild relief.    C/o fatigue x 3-4 wks, dizzines, nausea  -- a lot more lately. He is taking Toprol at lunch, more sx's in pm C/o DOE. No CP  Review of Systems  Constitutional: Positive for fatigue. Negative for appetite change and unexpected weight change.  HENT: Negative for nosebleeds, congestion, sore throat, sneezing, trouble swallowing and neck pain.   Eyes: Negative for itching and visual disturbance.  Cardiovascular: Negative for leg swelling.  Gastrointestinal: Negative for diarrhea, blood in stool and abdominal distention.  Genitourinary: Positive for urgency and flank pain. Negative for dysuria, frequency and hematuria.  Musculoskeletal: Positive for myalgias, back pain and arthralgias. Negative for joint swelling and gait problem.  Skin: Negative for rash.  Neurological: Positive for light-headedness. Negative for tremors and speech difficulty.  Psychiatric/Behavioral: Negative for sleep disturbance, self-injury, dysphoric mood and agitation. The patient is not nervous/anxious.        Objective:   Physical Exam  Constitutional: He is oriented to person, place, and time. He appears well-developed.  HENT:  Mouth/Throat: Oropharynx is clear and moist.  Eyes: Conjunctivae are normal. Pupils are equal, round, and reactive to light.  Neck: Normal range of motion. No JVD present. No thyromegaly present.  Cardiovascular: Normal rate, regular rhythm, normal heart sounds and intact distal pulses.  Exam reveals no gallop and no friction rub.   No  murmur heard. Pulmonary/Chest: Effort normal and breath sounds normal. No respiratory distress. He has no wheezes. He has no rales. He exhibits no tenderness.  Abdominal: Soft. Bowel sounds are normal. He exhibits no distension and no mass. There is no tenderness. There is no rebound and no guarding.  Musculoskeletal: Normal range of motion. He exhibits no edema and no tenderness.  Lymphadenopathy:    He has no cervical adenopathy.  Neurological: He is alert and oriented to person, place, and time. He has normal reflexes. No cranial nerve deficit. He exhibits normal muscle tone. Coordination normal.  Skin: Skin is warm and dry. No rash noted.  Psychiatric: He has a normal mood and affect. His behavior is normal. Judgment and thought content normal.    I personally provided the Symbicort inhaler use teaching. After the teaching patient was able to demonstrate it's use effectively. All questions were answered  Lab Results  Component Value Date   WBC 14.4* 01/14/2013   HGB 15.7 01/14/2013   HCT 46.4 01/14/2013   PLT 243.0 01/14/2013   GLUCOSE 113* 01/14/2013   CHOL 141 03/22/2012   TRIG 87.0 03/22/2012   HDL 43.80 03/22/2012   LDLCALC 80 03/22/2012   ALT 16 06/19/2010   AST 20 06/19/2010   NA 139 01/14/2013   K 5.2* 01/14/2013   CL 108 01/14/2013   CREATININE 1.8* 01/14/2013   BUN 43* 01/14/2013   CO2 24 01/14/2013   TSH 2.82 01/14/2013   PSA 1.45 02/19/2007        Assessment & Plan:

## 2013-01-26 NOTE — Assessment & Plan Note (Signed)
UA Empiric Cipro

## 2013-01-26 NOTE — Assessment & Plan Note (Signed)
Cipro F/u w/Urol

## 2013-01-26 NOTE — Assessment & Plan Note (Signed)
Worse. Will treat prostatitis

## 2013-01-28 ENCOUNTER — Emergency Department (HOSPITAL_COMMUNITY)
Admission: EM | Admit: 2013-01-28 | Discharge: 2013-01-29 | Disposition: A | Discharge status: Home / Self Care | Payer: Medicare Other | Attending: Emergency Medicine | Admitting: Emergency Medicine

## 2013-01-28 ENCOUNTER — Encounter (HOSPITAL_COMMUNITY): Payer: Self-pay | Admitting: *Deleted

## 2013-01-28 DIAGNOSIS — Z951 Presence of aortocoronary bypass graft: Secondary | ICD-10-CM | POA: Insufficient documentation

## 2013-01-28 DIAGNOSIS — R000 Tachycardia, unspecified: Secondary | ICD-10-CM

## 2013-01-28 DIAGNOSIS — Z8739 Personal history of other diseases of the musculoskeletal system and connective tissue: Secondary | ICD-10-CM | POA: Insufficient documentation

## 2013-01-28 DIAGNOSIS — I2510 Atherosclerotic heart disease of native coronary artery without angina pectoris: Secondary | ICD-10-CM | POA: Insufficient documentation

## 2013-01-28 DIAGNOSIS — R110 Nausea: Secondary | ICD-10-CM | POA: Insufficient documentation

## 2013-01-28 DIAGNOSIS — K219 Gastro-esophageal reflux disease without esophagitis: Secondary | ICD-10-CM | POA: Insufficient documentation

## 2013-01-28 DIAGNOSIS — J449 Chronic obstructive pulmonary disease, unspecified: Secondary | ICD-10-CM | POA: Insufficient documentation

## 2013-01-28 DIAGNOSIS — J4489 Other specified chronic obstructive pulmonary disease: Secondary | ICD-10-CM | POA: Insufficient documentation

## 2013-01-28 DIAGNOSIS — E785 Hyperlipidemia, unspecified: Secondary | ICD-10-CM | POA: Insufficient documentation

## 2013-01-28 DIAGNOSIS — Z79899 Other long term (current) drug therapy: Secondary | ICD-10-CM | POA: Insufficient documentation

## 2013-01-28 DIAGNOSIS — Z7982 Long term (current) use of aspirin: Secondary | ICD-10-CM | POA: Insufficient documentation

## 2013-01-28 DIAGNOSIS — Z87891 Personal history of nicotine dependence: Secondary | ICD-10-CM | POA: Insufficient documentation

## 2013-01-28 LAB — CBC WITH DIFFERENTIAL/PLATELET
Eosinophils Absolute: 0.1 10*3/uL (ref 0.0–0.7)
Eosinophils Relative: 1 % (ref 0–5)
HCT: 39.1 % (ref 39.0–52.0)
Hemoglobin: 13.6 g/dL (ref 13.0–17.0)
Lymphocytes Relative: 27 % (ref 12–46)
Lymphs Abs: 2.4 10*3/uL (ref 0.7–4.0)
MCH: 33.1 pg (ref 26.0–34.0)
MCV: 95.1 fL (ref 78.0–100.0)
Monocytes Absolute: 0.6 10*3/uL (ref 0.1–1.0)
Monocytes Relative: 7 % (ref 3–12)
Platelets: 247 10*3/uL (ref 150–400)
RBC: 4.11 MIL/uL — ABNORMAL LOW (ref 4.22–5.81)
WBC: 8.8 10*3/uL (ref 4.0–10.5)

## 2013-01-28 LAB — COMPREHENSIVE METABOLIC PANEL
ALT: 15 U/L (ref 0–53)
BUN: 21 mg/dL (ref 6–23)
CO2: 27 mEq/L (ref 19–32)
Calcium: 9.4 mg/dL (ref 8.4–10.5)
GFR calc Af Amer: 61 mL/min — ABNORMAL LOW (ref >90)
GFR calc non Af Amer: 53 mL/min — ABNORMAL LOW (ref >90)
Glucose, Bld: 152 mg/dL — ABNORMAL HIGH (ref 70–99)
Sodium: 138 mEq/L (ref 135–145)
Total Protein: 6.6 g/dL (ref 6.0–8.3)

## 2013-01-28 LAB — TROPONIN I: Troponin I: <0.3 ng/mL (ref <0.30)

## 2013-01-28 MED ORDER — SODIUM CHLORIDE 0.9 % IV BOLUS (SEPSIS)
1000.0000 mL | Freq: Once | INTRAVENOUS | Status: AC
  Administered 2013-01-28: 1000 mL via INTRAVENOUS

## 2013-01-28 NOTE — ED Provider Notes (Signed)
History     CSN: [id number]  Arrival date & time 01/28/13  2217   First MD Initiated Contact with Patient 01/28/13 2322      Chief Complaint  Patient presents with  . Tachycardia    (Consider location/radiation/quality/duration/timing/severity/associated sxs/prior treatment) HPI  This patient is a very pleasant 72 year old man with history of coronary artery disease, status post CABG, COPD, hyperlipidemia. He presents to the emergency department because he felt like his heart rate was more elevated than usual this evening.  He says that typically his heart rate runs between 50 and 55. This evening, he checks his heart rate and it was between 85 and 90. He had some associated mild nausea and a feeling of shakiness which has now passed.  He denies any sense of irregular heart rate. Denies chest pain and SOB.   Patient was seen by his cardiologist at [facility] earlier in the week and was told to decrease dose of Metoprolol XL from 50mg  to 25mg  qd. But, patient has actually not made this med change yet.    Patient saw his PCP two weeks ago for complaint of feeling lightheaded. PCP told him to discontinue HCTZ 12.5mg  which he has done.  Patient also complained of chills, a low grade fever (99.1) along with cough. Pt says that PCP diagnosed him with prostatitis and started him on Cipro. Pt says that he did not have any diagnostic studies done on that visit such as CXR, U/A, blood work and did not have a prostate exam. Patient has a very remote history of prostatitis x 1 (> 10 years ago).   Patient has not had any subsequent fever. No cough or SOB. No GU or GI sx. No other med changes.   Past Medical History  Diagnosis Date  . COPD (chronic obstructive pulmonary disease)   . Asthma   . Coronary artery disease     s/p stenting to the right coronary artery with subsequent CABG.  He has LIMA to the LAD,SVG to diagonal, SVG to obtuse marginal, and SVG to PDA,  . Hyperlipidemia   .  Osteoarthritis   . GERD (gastroesophageal reflux disease)   . Low back pain     Past Surgical History  Procedure Laterality Date  . Coronary artery bypass graft  2006  . Appendectomy    . Hemorrhoid surgery      Family History  Problem Relation Age of Onset  . Heart attack Mother   . Heart attack Father   . Heart attack Brother   . Hypertension Other   . Colon cancer Neg Hx   . Diabetes Mother     History  Substance Use Topics  . Smoking status: Former Smoker -- 1.00 packs/day for 40 years    Types: Cigarettes    Quit date: 11/04/2003  . Smokeless tobacco: Never Used  . Alcohol Use: No      Review of Systems Gen: as per hpi, otherwise negative Eyes: no discharge or drainage, no occular pain or visual changes Nose: no epistaxis or rhinorrhea Mouth: no dental pain, no sore throat Neck: no neck pain Lungs: As per history of present illness, otherwise negative CV: As per history of present illness, otherwise negative Abd: no abdominal pain, nausea, vomiting GU: no dysuria or gross hematuria MSK: no myalgias or arthralgias Neuro: no headache, no focal neurologic deficits Skin: no rash Psyche: negative.  Allergies  Rosuvastatin and Zolpidem  Home Medications   Current Outpatient Rx  Name  Route  Sig  Dispense  Refill  . aspirin 81 MG tablet   Oral   Take 81 mg by mouth daily.         . budesonide-formoterol (SYMBICORT) 160-4.5 MCG/ACT inhaler   Inhalation   Inhale 2 puffs into the lungs 2 (two) times daily.         . cholecalciferol (VITAMIN D) 1000 UNITS tablet   Oral   Take 1 tablet (1,000 Units total) by mouth daily.   100 tablet   3   . ciprofloxacin (CIPRO) 500 MG tablet   Oral   Take 1 tablet (500 mg total) by mouth 2 (two) times daily.   28 tablet   1   . losartan (COZAAR) 50 MG tablet   Oral   Take 1 tablet (50 mg total) by mouth 2 (two) times daily.   180 tablet   3   . metoprolol succinate (TOPROL-XL) 25 MG 24 hr tablet    Oral   Take 1 tablet (25 mg total) by mouth daily.   30 tablet   11   . nitroGLYCERIN (NITROSTAT) 0.4 MG SL tablet   Sublingual   Place 0.4 mg under the tongue every 5 (five) minutes as needed. x3 doses as needed for chest pain         . omeprazole (PRILOSEC) 20 MG capsule   Oral   Take 1 capsule (20 mg total) by mouth daily.   90 capsule   3   . pravastatin (PRAVACHOL) 40 MG tablet   Oral   Take 40 mg by mouth every evening.         . promethazine (PHENERGAN) 25 MG tablet   Oral   Take 25 mg by mouth every 6 (six) hours as needed for nausea. For nausea/vomiting         . Tamsulosin HCl (FLOMAX) 0.4 MG CAPS   Oral   Take 0.4 mg by mouth daily.          . traMADol (ULTRAM) 50 MG tablet   Oral   Take 50 mg by mouth every 6 (six) hours as needed. For pain           BP 139/88  Pulse 76  Temp(Src) 98.2 F (36.8 C)  Resp 18  SpO2 98%  Physical Exam Postural VS on my exam: Supine: pulse 75, BP 151/82 to standing pulse 98 BP 124/87. No symptoms. Gen: well developed and well nourished appearing Head: NCAT Eyes: PERL, EOMI Nose: no epistaixis or rhinorrhea Mouth/throat: mucosa is pink and very slightly dehydrated appearing Neck: supple, no stridor Lungs: CTA B, no wheezing, rhonchi or rales CV: RRR, no murmur Abd: soft, notender, nondistended Back: no ttp, no cva ttp Skin: no rashese, wnl Neuro: CN ii-xii grossly intact, no focal deficits, 5/5 motor strength both arms and legs.  Psyche; normal affect,  calm and cooperative.   ED Course  Procedures (including critical care time)  Results for orders placed during the hospital encounter of 01/28/13 (from the past 24 hour(s))  CBC WITH DIFFERENTIAL     Status: Abnormal   Collection Time    01/28/13 10:24 PM      Result Value Range   WBC 8.8  4.0 - 10.5 K/uL   RBC 4.11 (*) 4.22 - 5.81 MIL/uL   Hemoglobin 13.6  13.0 - 17.0 g/dL   HCT 86.5  78.4 - 69.6 %   MCV 95.1  78.0 - 100.0 fL   MCH 33.1  26.0 -  34.0  pg   MCHC 34.8  30.0 - 36.0 g/dL   RDW 40.9  81.1 - 91.4 %   Platelets 247  150 - 400 K/uL   Neutrophils Relative 64  43 - 77 %   Neutro Abs 5.6  1.7 - 7.7 K/uL   Lymphocytes Relative 27  12 - 46 %   Lymphs Abs 2.4  0.7 - 4.0 K/uL   Monocytes Relative 7  3 - 12 %   Monocytes Absolute 0.6  0.1 - 1.0 K/uL   Eosinophils Relative 1  0 - 5 %   Eosinophils Absolute 0.1  0.0 - 0.7 K/uL   Basophils Relative 0  0 - 1 %   Basophils Absolute 0.0  0.0 - 0.1 K/uL  COMPREHENSIVE METABOLIC PANEL     Status: Abnormal   Collection Time    01/28/13 10:24 PM      Result Value Range   Sodium 138  135 - 145 mEq/L   Potassium 3.8  3.5 - 5.1 mEq/L   Chloride 101  96 - 112 mEq/L   CO2 27  19 - 32 mEq/L   Glucose, Bld 152 (*) 70 - 99 mg/dL   BUN 21  6 - 23 mg/dL   Creatinine, Ser 7.82  0.50 - 1.35 mg/dL   Calcium 9.4  8.4 - 95.6 mg/dL   Total Protein 6.6  6.0 - 8.3 g/dL   Albumin 3.3 (*) 3.5 - 5.2 g/dL   AST 19  0 - 37 U/L   ALT 15  0 - 53 U/L   Alkaline Phosphatase 56  39 - 117 U/L   Total Bilirubin 0.2 (*) 0.3 - 1.2 mg/dL   GFR calc non Af Amer 53 (*) >90 mL/min   GFR calc Af Amer 61 (*) >90 mL/min  TROPONIN I     Status: None   Collection Time    01/28/13 10:25 PM      Result Value Range   Troponin I <0.30  <0.30 ng/mL   EKG: sinus tach (108) with new RBBB compared to 12/13 EKG and progression of from incomplete RBBB on 01/25/13. Both previous EKGs notable for sinus bradycardia.   DDX: dehydration, AKI, electrolyte disturbance, endocrinopathy, ACS, disrrytmia, occult infection, anemia, PE.   Renal function wnl. CBC wnl. Lytes wnl with slightly elevated glucose level. Postural VS suggest dehydration. We will tx with 1L of NS and re-evaluate. First tpn negative. Will eval delta troponin. Checking U/A and CXR for occult infection in light of recent sx.    MDM   ED work up is nondiagnostic. We have ruled out PNA, PE, PTX, anemia, lyte disturbances, delta trop is normal and ekgs are normal.  Pt is feeling better and postural change in pulse has normalized. I believe that patient may have been a bit dehydrated. Patient is stable for discharge with counsel to return to the ED for red flag sx and to f/u with his PCP on Monday for a recheck and consideration of thyroid studies.        Brandt Loosen, MD 01/29/13 [phone]

## 2013-01-28 NOTE — ED Notes (Signed)
Tachycardia with nausea today.  No chest pain.  Alert  Skin warm and dry

## 2013-01-28 NOTE — ED Notes (Signed)
The pts resting pulse is usually 55.  He takes toprol

## 2013-01-29 ENCOUNTER — Emergency Department (HOSPITAL_COMMUNITY): Payer: Medicare Other

## 2013-01-29 LAB — URINALYSIS, ROUTINE W REFLEX MICROSCOPIC
Bilirubin Urine: NEGATIVE
Hgb urine dipstick: NEGATIVE
Ketones, ur: NEGATIVE mg/dL
Nitrite: NEGATIVE
Urobilinogen, UA: 0.2 mg/dL (ref 0.0–1.0)

## 2013-01-29 LAB — POCT I-STAT TROPONIN I

## 2013-01-29 LAB — PROTIME-INR: INR: 0.96 (ref 0.00–1.49)

## 2013-01-29 MED ORDER — ACETAMINOPHEN 325 MG PO TABS
650.0000 mg | ORAL_TABLET | Freq: Once | ORAL | Status: AC
  Administered 2013-01-29: 650 mg via ORAL
  Filled 2013-01-29: qty 2

## 2013-01-31 ENCOUNTER — Telehealth: Payer: Self-pay | Admitting: Cardiology

## 2013-01-31 DIAGNOSIS — I471 Supraventricular tachycardia: Secondary | ICD-10-CM

## 2013-01-31 NOTE — Telephone Encounter (Signed)
Spoke with patient who states was seen in ED 3/28 for elevated HR and needs to follow-up with his cardiologist.  ED D/C instructions state patient is to f/u with PCP and patient states he does not think his PCP can help him; wants to see Dr. Antoine Poche.  Patient advised that I will send message to Dr. Antoine Poche due to no open appointments in the next few weeks.  Patient understands and states Dr. Antoine Poche told him to call him anytime he needed him. Patient states heart rate is not elevated at this time but that he is afraid to do too much in fear that it will go up.  Patient denies chest pain or SOB.

## 2013-01-31 NOTE — Telephone Encounter (Signed)
Please have him wear a 48 hour Holter and then see me after that result is available.

## 2013-01-31 NOTE — Telephone Encounter (Signed)
New problem   Pt was seen in [facility] ED 01/28/13 for elevated heart beat and pt was told to call office to be seen by Dr Antoine Poche in 2 days. I advised pt he didn't have anything. Pt wanted to let nurse know.

## 2013-02-01 NOTE — Telephone Encounter (Signed)
Called patient's wife (he was out playing golf). Advised her of plan for 48 hour holter monitor. Will send a referral and he will be contacted to schedule appointment.

## 2013-02-08 ENCOUNTER — Telehealth: Payer: Self-pay | Admitting: *Deleted

## 2013-02-08 ENCOUNTER — Encounter: Payer: Self-pay | Admitting: Cardiology

## 2013-02-08 ENCOUNTER — Encounter (INDEPENDENT_AMBULATORY_CARE_PROVIDER_SITE_OTHER): Payer: Medicare Other

## 2013-02-08 DIAGNOSIS — R000 Tachycardia, unspecified: Secondary | ICD-10-CM

## 2013-02-08 NOTE — Telephone Encounter (Signed)
48 hr holter monitor placed on Pt 02/08/13 TK

## 2013-02-24 ENCOUNTER — Ambulatory Visit (INDEPENDENT_AMBULATORY_CARE_PROVIDER_SITE_OTHER): Payer: Medicare Other | Admitting: Internal Medicine

## 2013-02-24 ENCOUNTER — Encounter: Payer: Self-pay | Admitting: Internal Medicine

## 2013-02-24 VITALS — BP 150/100 | HR 72 | Temp 97.3°F | Resp 16 | Wt 141.0 lb

## 2013-02-24 DIAGNOSIS — G4700 Insomnia, unspecified: Secondary | ICD-10-CM

## 2013-02-24 DIAGNOSIS — N419 Inflammatory disease of prostate, unspecified: Secondary | ICD-10-CM

## 2013-02-24 DIAGNOSIS — I2510 Atherosclerotic heart disease of native coronary artery without angina pectoris: Secondary | ICD-10-CM

## 2013-02-24 DIAGNOSIS — R5383 Other fatigue: Secondary | ICD-10-CM

## 2013-02-24 DIAGNOSIS — R5381 Other malaise: Secondary | ICD-10-CM

## 2013-02-24 MED ORDER — TRAMADOL HCL 50 MG PO TABS: 50.0000 mg | ORAL_TABLET | Freq: Four times a day (QID) | ORAL | Status: DC | PRN

## 2013-02-24 MED ORDER — CANDESARTAN CILEXETIL 32 MG PO TABS: 32.0000 mg | ORAL_TABLET | Freq: Every day | ORAL | Status: DC

## 2013-02-24 MED ORDER — CLONAZEPAM 0.25 MG PO TBDP: 0.2500 mg | ORAL_TABLET | Freq: Every evening | ORAL | Status: DC | PRN

## 2013-02-24 NOTE — Progress Notes (Signed)
   Subjective:     HPI  F/u fatigue x 3-4 wks, dizzines, nausea  -- resolved. He is taking Toprol at lunch, more sx's in pm C/o much less DOE. No CP  He went to ER on 3/28:  "We have ruled out PNA, PE, PTX, anemia, lyte disturbances, delta trop is normal and ekgs are normal. Pt is feeling better and postural change in pulse has normalized. I believe that patient may have been a bit dehydrated".   Review of Systems  Constitutional: Positive for fatigue. Negative for appetite change and unexpected weight change.  HENT: Negative for nosebleeds, sneezing, trouble swallowing and neck pain.   Eyes: Negative for itching and visual disturbance.  Cardiovascular: Negative for leg swelling.  Gastrointestinal: Negative for blood in stool and abdominal distention.  Genitourinary: Positive for urgency and flank pain. Negative for dysuria, frequency and hematuria.  Musculoskeletal: Positive for myalgias, back pain and arthralgias. Negative for joint swelling and gait problem.  Neurological: Positive for light-headedness. Negative for tremors and speech difficulty.  Psychiatric/Behavioral: Negative for sleep disturbance, self-injury, dysphoric mood and agitation. The patient is not nervous/anxious.        Objective:   Physical Exam  Constitutional: He is oriented to person, place, and time. He appears well-developed.  HENT:  Mouth/Throat: Oropharynx is clear and moist.  Eyes: Conjunctivae are normal. Pupils are equal, round, and reactive to light.  Neck: Normal range of motion. No JVD present. No thyromegaly present.  Cardiovascular: Normal rate, regular rhythm, normal heart sounds and intact distal pulses.  Exam reveals no gallop and no friction rub.   No murmur heard. Pulmonary/Chest: Effort normal and breath sounds normal. No respiratory distress. He has no wheezes. He has no rales. He exhibits no tenderness.  Abdominal: Soft. Bowel sounds are normal. He exhibits no distension and no mass.  There is no tenderness. There is no rebound and no guarding.  Musculoskeletal: Normal range of motion. He exhibits no edema and no tenderness.  Lymphadenopathy:    He has no cervical adenopathy.  Neurological: He is alert and oriented to person, place, and time. He has normal reflexes. No cranial nerve deficit. He exhibits normal muscle tone. Coordination normal.  Skin: Skin is warm and dry. No rash noted.  Psychiatric: He has a normal mood and affect. His behavior is normal. Judgment and thought content normal.     Lab Results  Component Value Date   WBC 8.8 01/28/2013   HGB 13.6 01/28/2013   HCT 39.1 01/28/2013   PLT 247 01/28/2013   GLUCOSE 152* 01/28/2013   CHOL 141 03/22/2012   TRIG 87.0 03/22/2012   HDL 43.80 03/22/2012   LDLCALC 80 03/22/2012   ALT 15 01/28/2013   AST 19 01/28/2013   NA 138 01/28/2013   K 3.8 01/28/2013   CL 101 01/28/2013   CREATININE 1.32 01/28/2013   BUN 21 01/28/2013   CO2 27 01/28/2013   TSH 2.82 01/14/2013   PSA 1.45 02/19/2007   INR 0.96 01/29/2013        Assessment & Plan:

## 2013-02-24 NOTE — Assessment & Plan Note (Signed)
Better  

## 2013-02-24 NOTE — Assessment & Plan Note (Signed)
Will try a low dose clonazepam

## 2013-02-24 NOTE — Patient Instructions (Signed)
If BP is high switch to Atacand from Losartan

## 2013-02-24 NOTE — Assessment & Plan Note (Signed)
Continue with current prescription therapy as reflected on the Med list.  

## 2013-02-24 NOTE — Assessment & Plan Note (Signed)
Resolved

## 2013-03-02 ENCOUNTER — Telehealth: Payer: Self-pay | Admitting: Cardiology

## 2013-03-02 DIAGNOSIS — I2510 Atherosclerotic heart disease of native coronary artery without angina pectoris: Secondary | ICD-10-CM

## 2013-03-02 MED ORDER — METOPROLOL SUCCINATE ER 25 MG PO TB24: 37.5000 mg | ORAL_TABLET | Freq: Every day | ORAL | Status: DC

## 2013-03-02 NOTE — Telephone Encounter (Signed)
New problem   Pt need to know results of heart monitor test results and he had submitted blood pressure readings to Dr Hochrein/Pam last week and need to know how he can adjust his medications b/c of it being to high. Please call pt.

## 2013-03-02 NOTE — Telephone Encounter (Signed)
Pt is taking 25 mg once daily of the succinate.  He will take 1 and 1/2 tablet daily and call back with any questions/concerns

## 2013-03-02 NOTE — Telephone Encounter (Signed)
Pt to start 37.5 mg of Metoprolol bid.

## 2013-04-04 ENCOUNTER — Encounter: Payer: Self-pay | Admitting: Cardiology

## 2013-04-04 ENCOUNTER — Ambulatory Visit (INDEPENDENT_AMBULATORY_CARE_PROVIDER_SITE_OTHER): Payer: Medicare Other | Admitting: Cardiology

## 2013-04-04 VITALS — BP 210/102 | HR 53 | Ht 68.0 in | Wt 143.6 lb

## 2013-04-04 DIAGNOSIS — I2510 Atherosclerotic heart disease of native coronary artery without angina pectoris: Secondary | ICD-10-CM

## 2013-04-04 DIAGNOSIS — I10 Essential (primary) hypertension: Secondary | ICD-10-CM

## 2013-04-04 MED ORDER — AMLODIPINE BESYLATE 2.5 MG PO TABS: 2.5000 mg | ORAL_TABLET | Freq: Every day | ORAL | Status: DC

## 2013-04-04 NOTE — Progress Notes (Signed)
HPI The patient returned for followup of dizziness and weakness. He has a history of coronary artery disease as described below..  He had a perfusion study earlier this year which showed an LV Ejection Fraction: 67% with normal LV function and wall motion. When I was seeing him last year his blood pressures have been creeping up. I actually added hydrochlorothiazide to his regimen. However, a few months ago he started noticing increasing leg weakness and fatigue. Subsequently saw his primary provider who drew labs demonstrating that his potassium was mildly elevated. His BUN was 43 with a creatinine of 1.8. He was told to stop his hydrochlorothiazide which he did. He also reduced his beta blocker and ARB dosing in half though he's now back up old dose.  I did reduce his beta blocker and the last dose as he was still running low.  He returns for followup today and his blood pressures going back up.  His blood pressure is back up. Over the last several weeks routinely his systolics have been in the 170s and 180s. He actually feels quite well. He's golfing almost every day. He denies any chest pressure, neck or arm discomfort. He's not having any palpitations, presyncope or syncope. He denies any PND or orthopnea. He has had no weight gain or edema.  Of note I did look at his labs and his TSH was normal recently. Urinalysis demonstrated no acute abnormalities.   Allergies  Allergen Reactions  . Rosuvastatin Other (See Comments)    REACTION:muscle aches  . Zolpidem Other (See Comments)    confusion    Current Outpatient Prescriptions  Medication Sig Dispense Refill  . aspirin 81 MG tablet Take 81 mg by mouth daily.      . budesonide-formoterol (SYMBICORT) 160-4.5 MCG/ACT inhaler Inhale 2 puffs into the lungs 2 (two) times daily.      Marland Kitchen losartan (COZAAR) 50 MG tablet       . metoprolol succinate (TOPROL-XL) 25 MG 24 hr tablet Take 1.5 tablets (37.5 mg total) by mouth daily.  135 tablet  3  .  nitroGLYCERIN (NITROSTAT) 0.4 MG SL tablet Place 0.4 mg under the tongue every 5 (five) minutes as needed. x3 doses as needed for chest pain      . omeprazole (PRILOSEC) 20 MG capsule Take 1 capsule (20 mg total) by mouth daily.  90 capsule  3  . pravastatin (PRAVACHOL) 40 MG tablet Take 40 mg by mouth every evening.      . promethazine (PHENERGAN) 25 MG tablet Take 25 mg by mouth every 6 (six) hours as needed for nausea. For nausea/vomiting      . Tamsulosin HCl (FLOMAX) 0.4 MG CAPS Take 0.4 mg by mouth daily.       . traMADol (ULTRAM) 50 MG tablet Take 1 tablet (50 mg total) by mouth every 6 (six) hours as needed. For pain  100 tablet  2   No current facility-administered medications for this visit.    Past Medical History  Diagnosis Date  . COPD (chronic obstructive pulmonary disease)   . Asthma   . Coronary artery disease     s/p stenting to the right coronary artery with subsequent CABG.  He has LIMA to the LAD,SVG to diagonal, SVG to obtuse marginal, and SVG to PDA,  . Hyperlipidemia   . Osteoarthritis   . GERD (gastroesophageal reflux disease)   . Low back pain     Past Surgical History  Procedure Laterality Date  . Coronary artery  bypass graft  2006  . Appendectomy    . Hemorrhoid surgery      ROS:  As stated in the history of present illness negative roll of systems.  PHYSICAL EXAM BP 210/102  Pulse 53  Ht 5\' 8"  (1.727 m)  Wt 143 lb 9.6 oz (65.137 kg)  BMI 21.84 kg/m2 GENERAL:  Well appearing NECK:  No jugular venous distention, waveform within normal limits, carotid upstroke brisk and symmetric, no bruits, no thyromegaly LUNGS:  Clear to auscultation bilaterally BACK:  No CVA tenderness CHEST:  Well healed sternotomy scar. HEART:  PMI not displaced or sustained,S1 and S2 within normal limits, no S3, no S4, no clicks, no rubs, no murmurs ABD:  Flat, positive bowel sounds normal in frequency in pitch, no bruits, no rebound, no guarding, no midline pulsatile mass, no  hepatomegaly, no splenomegaly EXT:  2 plus pulses throughout, no edema, no cyanosis no clubbing  ASSESSMENT AND PLAN  HTN- Given the lability of his blood pressure recently we need to continue to watch it closely. I'm not going to add back a diuretic as he became dehydrated. I will however add Norvasc 2.5 mg daily. I would like to check renal Dopplers given the recent renal insufficiency and blood pressures difficulty  CAD -  The patient has no new sypmtoms since his stress perfusion study last year.  No further cardiovascular testing is indicated.  We will continue with aggressive risk reduction and meds as listed.  HYPERLIPIDEMIA -  He is not really at target dose of statin. However, his lipids have been so well controlled but I don't see an indication to change.

## 2013-04-04 NOTE — Patient Instructions (Addendum)
Please start Amlodipine 2.5 mg a day. Continue all other medications as listed.  Your physician has requested that you have a renal artery duplex. During this test, an ultrasound is used to evaluate blood flow to the kidneys. Allow one hour for this exam. Do not eat after midnight the day before and avoid carbonated beverages. Take your medications as you usually do.  Follow up with Dr Antoine Poche in about 1 month.

## 2013-04-06 ENCOUNTER — Other Ambulatory Visit: Payer: Self-pay

## 2013-04-06 MED ORDER — PRAVASTATIN SODIUM 40 MG PO TABS: 40.0000 mg | ORAL_TABLET | Freq: Every evening | ORAL | Status: DC

## 2013-04-06 NOTE — Telephone Encounter (Signed)
..   Requested Prescriptions   Signed Prescriptions Disp Refills  . pravastatin (PRAVACHOL) 40 MG tablet 30 tablet 12    Sig: Take 1 tablet (40 mg total) by mouth every evening.    Authorizing Provider: Rollene Rotunda    Ordering User: Christella Hartigan, Jumar Greenstreet Judie Petit

## 2013-04-14 ENCOUNTER — Encounter (INDEPENDENT_AMBULATORY_CARE_PROVIDER_SITE_OTHER): Payer: Medicare Other

## 2013-04-14 DIAGNOSIS — I700 Atherosclerosis of aorta: Secondary | ICD-10-CM

## 2013-04-14 DIAGNOSIS — I10 Essential (primary) hypertension: Secondary | ICD-10-CM

## 2013-04-27 ENCOUNTER — Other Ambulatory Visit: Payer: Self-pay

## 2013-04-27 MED ORDER — PRAVASTATIN SODIUM 40 MG PO TABS: 40.0000 mg | ORAL_TABLET | Freq: Every evening | ORAL | Status: DC

## 2013-05-03 ENCOUNTER — Other Ambulatory Visit: Payer: Self-pay

## 2013-05-03 MED ORDER — OMEPRAZOLE 20 MG PO CPDR: 20.0000 mg | DELAYED_RELEASE_CAPSULE | Freq: Every day | ORAL | Status: DC

## 2013-05-17 ENCOUNTER — Other Ambulatory Visit: Payer: Self-pay

## 2013-05-17 MED ORDER — PRAVASTATIN SODIUM 40 MG PO TABS: 40.0000 mg | ORAL_TABLET | Freq: Every evening | ORAL | Status: DC

## 2013-05-26 ENCOUNTER — Ambulatory Visit (INDEPENDENT_AMBULATORY_CARE_PROVIDER_SITE_OTHER): Payer: Medicare Other | Admitting: Cardiology

## 2013-05-26 ENCOUNTER — Encounter: Payer: Self-pay | Admitting: Cardiology

## 2013-05-26 VITALS — BP 140/80 | HR 54 | Ht 68.0 in | Wt 145.8 lb

## 2013-05-26 DIAGNOSIS — R079 Chest pain, unspecified: Secondary | ICD-10-CM

## 2013-05-26 DIAGNOSIS — I2510 Atherosclerotic heart disease of native coronary artery without angina pectoris: Secondary | ICD-10-CM

## 2013-05-26 DIAGNOSIS — I10 Essential (primary) hypertension: Secondary | ICD-10-CM

## 2013-05-26 MED ORDER — AMLODIPINE BESYLATE 2.5 MG PO TABS: 2.5000 mg | ORAL_TABLET | Freq: Every day | ORAL | Status: DC

## 2013-05-26 MED ORDER — OMEPRAZOLE 20 MG PO CPDR: 20.0000 mg | DELAYED_RELEASE_CAPSULE | Freq: Every day | ORAL | Status: DC

## 2013-05-26 NOTE — Patient Instructions (Addendum)
The current medical regimen is effective;  continue present plan and medications.  Follow up in 1 year with Dr Hochrein.  You will receive a letter in the mail 2 months before you are due.  Please call us when you receive this letter to schedule your follow up appointment.  

## 2013-05-26 NOTE — Progress Notes (Signed)
HPI The patient returned for followup of HTN.  This has been more difficult to control lately. At the last appointment I Norvasc. He tolerated this. His blood pressures have been a little labile but overall pretty well controlled. Systolics are at times in the 160s were down to the 120s. He is not describing any further weakness or palpitations. He is able to do his golfing without limitations.  The patient denies any new symptoms such as chest discomfort, neck or arm discomfort. There has been no new shortness of breath, PND or orthopnea. There have been no reported palpitations, presyncope or syncope.  I reviewed the BP diary.    Allergies  Allergen Reactions  . Rosuvastatin Other (See Comments)    REACTION:muscle aches  . Zolpidem Other (See Comments)    confusion    Current Outpatient Prescriptions  Medication Sig Dispense Refill  . amLODipine (NORVASC) 2.5 MG tablet Take 1 tablet (2.5 mg total) by mouth daily.  30 tablet  2  . aspirin 81 MG tablet Take 81 mg by mouth daily.      . budesonide-formoterol (SYMBICORT) 160-4.5 MCG/ACT inhaler Inhale 2 puffs into the lungs 2 (two) times daily.      Marland Kitchen losartan (COZAAR) 50 MG tablet 50 mg 2 (two) times daily.       . metoprolol succinate (TOPROL-XL) 25 MG 24 hr tablet Take 1.5 tablets (37.5 mg total) by mouth daily.  135 tablet  3  . nitroGLYCERIN (NITROSTAT) 0.4 MG SL tablet Place 0.4 mg under the tongue every 5 (five) minutes as needed. x3 doses as needed for chest pain      . omeprazole (PRILOSEC) 20 MG capsule Take 1 capsule (20 mg total) by mouth daily.  90 capsule  0  . pravastatin (PRAVACHOL) 40 MG tablet Take 1 tablet (40 mg total) by mouth every evening.  90 tablet  3  . promethazine (PHENERGAN) 25 MG tablet Take 25 mg by mouth every 6 (six) hours as needed for nausea. For nausea/vomiting      . Tamsulosin HCl (FLOMAX) 0.4 MG CAPS Take 0.4 mg by mouth daily.       . traMADol (ULTRAM) 50 MG tablet Take 1 tablet (50 mg total) by mouth  every 6 (six) hours as needed. For pain  100 tablet  2   No current facility-administered medications for this visit.    Past Medical History  Diagnosis Date  . COPD (chronic obstructive pulmonary disease)   . Asthma   . Coronary artery disease     s/p stenting to the right coronary artery with subsequent CABG.  He has LIMA to the LAD,SVG to diagonal, SVG to obtuse marginal, and SVG to PDA,  . Hyperlipidemia   . Osteoarthritis   . GERD (gastroesophageal reflux disease)   . Low back pain     Past Surgical History  Procedure Laterality Date  . Coronary artery bypass graft  2006  . Appendectomy    . Hemorrhoid surgery      ROS:  As stated in the history of present illness negative roll of systems.  PHYSICAL EXAM BP 140/80  Pulse 54  Ht 5\' 8"  (1.727 m)  Wt 145 lb 12.8 oz (66.134 kg)  BMI 22.17 kg/m2 GENERAL:  Well appearing NECK:  No jugular venous distention, waveform within normal limits, carotid upstroke brisk and symmetric, no bruits, no thyromegaly LUNGS:  Clear to auscultation bilaterally BACK:  No CVA tenderness CHEST:  Well healed sternotomy scar. HEART:  PMI  not displaced or sustained,S1 and S2 within normal limits, no S3, no S4, no clicks, no rubs, no murmurs ABD:  Flat, positive bowel sounds normal in frequency in pitch, no bruits, no rebound, no guarding, no midline pulsatile mass, no hepatomegaly, no splenomegaly EXT:  2 plus pulses throughout, no edema, no cyanosis no clubbing  EKG:  Sinus rhythm, rate 54, axis within normal limits, intervals within normal limits, no acute ST-T wave changes.  05/26/2013  ASSESSMENT AND PLAN  HTN- The blood pressure is at target. No change in medications is indicated. We will continue with therapeutic lifestyle changes (TLC).  CAD -  The patient has no new sypmtoms since his stress perfusion study last year.  No further cardiovascular testing is indicated.  We will continue with aggressive risk reduction and meds as  listed.  HYPERLIPIDEMIA -  He is not really at target dose of statin. However, his lipids have been so well controlled but I don't see an indication to change.

## 2013-07-18 ENCOUNTER — Other Ambulatory Visit: Payer: Self-pay | Admitting: Internal Medicine

## 2013-07-18 ENCOUNTER — Ambulatory Visit (INDEPENDENT_AMBULATORY_CARE_PROVIDER_SITE_OTHER): Payer: Medicare Other | Admitting: Internal Medicine

## 2013-07-18 ENCOUNTER — Encounter: Payer: Self-pay | Admitting: Internal Medicine

## 2013-07-18 VITALS — BP 150/90 | HR 64 | Temp 97.7°F | Resp 20 | Wt 147.0 lb

## 2013-07-18 DIAGNOSIS — J449 Chronic obstructive pulmonary disease, unspecified: Secondary | ICD-10-CM

## 2013-07-18 DIAGNOSIS — R5381 Other malaise: Secondary | ICD-10-CM

## 2013-07-18 DIAGNOSIS — R5383 Other fatigue: Secondary | ICD-10-CM

## 2013-07-18 DIAGNOSIS — J069 Acute upper respiratory infection, unspecified: Secondary | ICD-10-CM

## 2013-07-18 DIAGNOSIS — R05 Cough: Secondary | ICD-10-CM

## 2013-07-18 MED ORDER — METHYLPREDNISOLONE ACETATE 80 MG/ML IJ SUSP
80.0000 mg | Freq: Once | INTRAMUSCULAR | Status: AC
  Administered 2013-07-18: 80 mg via INTRAMUSCULAR

## 2013-07-18 MED ORDER — CEFUROXIME AXETIL 500 MG PO TABS: ORAL_TABLET | ORAL | Status: DC

## 2013-07-18 MED ORDER — TRAMADOL HCL 50 MG PO TABS: 50.0000 mg | ORAL_TABLET | Freq: Two times a day (BID) | ORAL | Status: DC | PRN

## 2013-07-18 NOTE — Patient Instructions (Addendum)
   Milk free trial (no milk, ice cream, cheese and yogurt) for 4-6 weeks. OK to use almond, coconut or rice milk. "Almond breeze" brand tastes good.

## 2013-07-18 NOTE — Assessment & Plan Note (Addendum)
Ceftin 500 mg bid  X 10 d CXR if not better

## 2013-07-18 NOTE — Telephone Encounter (Signed)
Ok to refill? Last OV 4.24.14 Last filled 4.24.14

## 2013-07-18 NOTE — Progress Notes (Signed)
   Subjective:     HPI C/u URI sx's x 1 wk - worse C/o fatigue x 1 wk C/o DOE. No CP  Review of Systems  Constitutional: Positive for fatigue. Negative for fever, appetite change and unexpected weight change.  HENT: Negative for nosebleeds, congestion, sore throat, sneezing, trouble swallowing and neck pain.   Eyes: Negative for itching and visual disturbance.  Respiratory: Positive for shortness of breath. Negative for cough.   Cardiovascular: Negative for chest pain, palpitations and leg swelling.  Gastrointestinal: Negative for nausea, diarrhea, blood in stool and abdominal distention.  Genitourinary: Negative for dysuria, urgency, frequency and hematuria.  Musculoskeletal: Negative for back pain, joint swelling and gait problem.  Skin: Negative for rash.  Neurological: Positive for dizziness and light-headedness. Negative for tremors, speech difficulty and weakness.  Psychiatric/Behavioral: Negative for sleep disturbance, self-injury, dysphoric mood and agitation. The patient is not nervous/anxious.        Objective:   Physical Exam  Constitutional: He is oriented to person, place, and time. He appears well-developed.  HENT:  Mouth/Throat: Oropharynx is clear and moist.  Eyes: Conjunctivae are normal. Pupils are equal, round, and reactive to light.  Neck: Normal range of motion. No JVD present. No thyromegaly present.  Cardiovascular: Normal rate, regular rhythm, normal heart sounds and intact distal pulses.  Exam reveals no gallop and no friction rub.   No murmur heard. Pulmonary/Chest: Effort normal and breath sounds normal. No respiratory distress. He has no wheezes. He has no rales. He exhibits no tenderness.  Abdominal: Soft. Bowel sounds are normal. He exhibits no distension and no mass. There is no tenderness. There is no rebound and no guarding.  Musculoskeletal: Normal range of motion. He exhibits no edema and no tenderness.  Lymphadenopathy:    He has no cervical  adenopathy.  Neurological: He is alert and oriented to person, place, and time. He has normal reflexes. No cranial nerve deficit. He exhibits normal muscle tone. Coordination normal.  Skin: Skin is warm and dry. No rash noted.  Psychiatric: He has a normal mood and affect. His behavior is normal. Judgment and thought content normal.    Lab Results  Component Value Date   WBC 8.8 01/28/2013   HGB 13.6 01/28/2013   HCT 39.1 01/28/2013   PLT 247 01/28/2013   GLUCOSE 152* 01/28/2013   CHOL 141 03/22/2012   TRIG 87.0 03/22/2012   HDL 43.80 03/22/2012   LDLCALC 80 03/22/2012   ALT 15 01/28/2013   AST 19 01/28/2013   NA 138 01/28/2013   K 3.8 01/28/2013   CL 101 01/28/2013   CREATININE 1.32 01/28/2013   BUN 21 01/28/2013   CO2 27 01/28/2013   TSH 2.82 01/14/2013   PSA 1.45 02/19/2007   INR 0.96 01/29/2013        Assessment & Plan:

## 2013-07-18 NOTE — Assessment & Plan Note (Signed)
COPD exacerbation/URI Depomedrol IM

## 2013-07-18 NOTE — Assessment & Plan Note (Signed)
Discussed.

## 2013-07-19 ENCOUNTER — Telehealth: Payer: Self-pay | Admitting: Internal Medicine

## 2013-07-19 MED ORDER — CEFUROXIME AXETIL 500 MG PO TABS: ORAL_TABLET | ORAL | Status: DC

## 2013-07-19 NOTE — Assessment & Plan Note (Signed)
Continue with current prescription therapy as reflected on the Med list. Depomedrol 80 mg im 

## 2013-07-19 NOTE — Telephone Encounter (Signed)
Patients wife called stating her husb came in yesterday and was given a prescription for Cefuroxime 500 mg But it was sent to the wrong pharmacy , she would like for it to be sent to [facility]  [place] Please contact her once it has been done  Thanks

## 2013-07-19 NOTE — Telephone Encounter (Signed)
Done. Pt's wife informed.  

## 2013-09-03 ENCOUNTER — Encounter: Payer: Self-pay | Admitting: Internal Medicine

## 2013-09-03 ENCOUNTER — Ambulatory Visit (INDEPENDENT_AMBULATORY_CARE_PROVIDER_SITE_OTHER): Payer: Medicare Other | Admitting: Internal Medicine

## 2013-09-03 VITALS — BP 142/80 | HR 53 | Temp 97.9°F | Ht 68.0 in | Wt 145.1 lb

## 2013-09-03 DIAGNOSIS — J449 Chronic obstructive pulmonary disease, unspecified: Secondary | ICD-10-CM

## 2013-09-03 DIAGNOSIS — Z87891 Personal history of nicotine dependence: Secondary | ICD-10-CM

## 2013-09-03 DIAGNOSIS — R042 Hemoptysis: Secondary | ICD-10-CM

## 2013-09-03 MED ORDER — AMOXICILLIN-POT CLAVULANATE 875-125 MG PO TABS: 1.0000 | ORAL_TABLET | Freq: Two times a day (BID) | ORAL | Status: DC

## 2013-09-03 NOTE — Progress Notes (Signed)
Chief Complaint  Patient presents with  . Cough    coughing up blood X 5 days    HPI: Patient comes in today for [facility] Saturday clinic for  new problem evaluation. Here with wife.  Onset  For about 3-4 days .  Of sx of concern. Underlying   Cough  Same  Noted  Some streak of blood  And then  2 days ago. Bright red.mixed in with mucous .  Clear otherwise  No change  symbicort  maintenance .   Minimal to no ur sx  Ex tobacco  Years   Doesn't likt to come to doctor .  Last rx for a resp infection was ceftin in September  ROS: See pertinent positives and negatives per HPI. No cp sob syncope other bleeding not on blood thinner except asa no nosebleed  Past Medical History  Diagnosis Date  . COPD (chronic obstructive pulmonary disease)   . Asthma   . Coronary artery disease     s/p stenting to the right coronary artery with subsequent CABG.  He has LIMA to the LAD,SVG to diagonal, SVG to obtuse marginal, and SVG to PDA,  . Hyperlipidemia   . Osteoarthritis   . GERD (gastroesophageal reflux disease)   . Low back pain     Family History  Problem Relation Age of Onset  . Heart attack Mother   . Diabetes Mother   . Heart attack Father   . Heart attack Brother   . Hypertension Other   . Colon cancer Neg Hx     History   Social History  . Marital Status: Married    Spouse Name: N/A    Number of Children: N/A  . Years of Education: N/A   Occupational History  . 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 Smoking status: Former Smoker -- 1.00 packs/day for 40 years    Types: Cigarettes    Quit date: 11/04/2003  . Smokeless tobacco: Never Used  . Alcohol Use: No  . Drug Use: No  . Sexual Activity: Not Currently   Other Topics Concern  . None   Social History Narrative  . None    Outpatient Encounter Prescriptions as of 09/03/2013  Medication Sig  . amLODipine (NORVASC) 2.5 MG tablet Take 1 tablet (2.5 mg total) by mouth daily.  Marland Kitchen aspirin 81 MG  tablet Take 81 mg by mouth daily.  . budesonide-formoterol (SYMBICORT) 160-4.5 MCG/ACT inhaler Inhale 2 puffs into the lungs 2 (two) times daily.  Marland Kitchen losartan (COZAAR) 50 MG tablet 50 mg 2 (two) times daily.   . metoprolol succinate (TOPROL-XL) 25 MG 24 hr tablet Take 1.5 tablets (37.5 mg total) by mouth daily.  . nitroGLYCERIN (NITROSTAT) 0.4 MG SL tablet Place 0.4 mg under the tongue every 5 (five) minutes as needed. x3 doses as needed for chest pain  . omeprazole (PRILOSEC) 20 MG capsule Take 1 capsule (20 mg total) by mouth daily.  . pravastatin (PRAVACHOL) 40 MG tablet Take 1 tablet (40 mg total) by mouth every evening.  . promethazine (PHENERGAN) 25 MG tablet Take 25 mg by mouth every 6 (six) hours as needed for nausea. For nausea/vomiting  . Tamsulosin HCl (FLOMAX) 0.4 MG CAPS Take 0.4 mg by mouth daily.   . traMADol (ULTRAM) 50 MG tablet Take 1-2 tablets (50-100 mg total) by mouth 2 (two) times daily as needed for pain.  Marland Kitchen amoxicillin-clavulanate (AUGMENTIN) 875-125 MG per tablet Take 1 tablet by  mouth every 12 (twelve) hours.  . [DISCONTINUED] cefUROXime (CEFTIN) 500 MG tablet 1 po bid  . [DISCONTINUED] traMADol (ULTRAM) 50 MG tablet TAKE 1 TO 2 TABLETS BY MOUTH EVERY 6 HOURS AS NEEDED    EXAM:  BP 142/80  Pulse 53  Temp(Src) 97.9 F (36.6 C) (Oral)  Ht 5\' 8"  (1.727 m)  Wt 145 lb 1.9 oz (65.826 kg)  BMI 22.07 kg/m2  SpO2 97%  Body mass index is 22.07 kg/(m^2).  GENERAL: vitals reviewed and listed above, alert, oriented, appears well hydrated and in no acute distress delgithfulo couple no resp distress and no acive cough  HEENT: atraumatic, conjunctiva  clear, no obvious abnormalities on inspection of external nose and ears  Clear dc in nares no tenderness OP : no lesion edema or exudate  NECK: no obvious masses on inspection palpation  No adenopathy jvd  LUNGS: clear to auscultation bilaterally, no wheezes, rales or rhonchi,dec bs throughout  CV: HRRR, no clubbing cyanosis or   No g or m peripheral edema nl cap refill  MS: moves all extremities without noticeable focal  abnormality PSYCH: pleasant and cooperative, no obvious depression or anxiety  ASSESSMENT AND PLAN:  Discussed the following assessment and plan:  Hemoptysis - poss from upper airwya of infection  at risk for malig etc  - Plan: amoxicillin-clavulanate (AUGMENTIN) 875-125 MG per tablet, DG Chest 2 View  Chronic airway obstruction, not elsewhere classified - on symbicort  - Plan: amoxicillin-clavulanate (AUGMENTIN) 875-125 MG per tablet, DG Chest 2 View  Ex-smoker Needs fu with PCP incase further eval required  -Patient advised to return or notify health care team  if symptoms worsen or persist or new concerns arise.  Patient Instructions  This could be a bacterial infection of the lungs or airways  Treat for this Get chest x ray when  X ray if open and then FU appt with Dr Demetrius Charity  At end of antibiotic   Neta Mends. Jacobey Gura M.D.

## 2013-09-03 NOTE — Patient Instructions (Signed)
This could be a bacterial infection of the lungs or airways  Treat for this Get chest x ray when  X ray if open and then FU appt with Dr Demetrius Charity  At end of antibiotic

## 2013-09-05 ENCOUNTER — Ambulatory Visit (INDEPENDENT_AMBULATORY_CARE_PROVIDER_SITE_OTHER)
Admission: RE | Admit: 2013-09-05 | Discharge: 2013-09-05 | Disposition: A | Discharge status: Home / Self Care | Payer: Medicare Other | Source: Ambulatory Visit | Attending: Internal Medicine | Admitting: Internal Medicine

## 2013-09-05 DIAGNOSIS — J4489 Other specified chronic obstructive pulmonary disease: Secondary | ICD-10-CM

## 2013-09-05 DIAGNOSIS — R042 Hemoptysis: Secondary | ICD-10-CM

## 2013-09-05 DIAGNOSIS — J449 Chronic obstructive pulmonary disease, unspecified: Secondary | ICD-10-CM

## 2013-09-08 ENCOUNTER — Other Ambulatory Visit: Payer: Self-pay

## 2013-09-12 ENCOUNTER — Encounter: Payer: Self-pay | Admitting: Internal Medicine

## 2013-09-12 ENCOUNTER — Ambulatory Visit (INDEPENDENT_AMBULATORY_CARE_PROVIDER_SITE_OTHER): Payer: Medicare Other | Admitting: Internal Medicine

## 2013-09-12 ENCOUNTER — Other Ambulatory Visit (INDEPENDENT_AMBULATORY_CARE_PROVIDER_SITE_OTHER): Payer: Medicare Other

## 2013-09-12 VITALS — BP 112/76 | HR 76 | Temp 96.9°F | Resp 16 | Wt 146.0 lb

## 2013-09-12 DIAGNOSIS — M6281 Muscle weakness (generalized): Secondary | ICD-10-CM

## 2013-09-12 DIAGNOSIS — R042 Hemoptysis: Secondary | ICD-10-CM

## 2013-09-12 DIAGNOSIS — R0602 Shortness of breath: Secondary | ICD-10-CM

## 2013-09-12 DIAGNOSIS — R918 Other nonspecific abnormal finding of lung field: Secondary | ICD-10-CM | POA: Insufficient documentation

## 2013-09-12 LAB — CBC WITH DIFFERENTIAL/PLATELET
Basophils Relative: 0.3 % (ref 0.0–3.0)
Eosinophils Absolute: 0.1 10*3/uL (ref 0.0–0.7)
Eosinophils Relative: 1.2 % (ref 0.0–5.0)
HCT: 45.4 % (ref 39.0–52.0)
Hemoglobin: 15.2 g/dL (ref 13.0–17.0)
Lymphocytes Relative: 23.4 % (ref 12.0–46.0)
Monocytes Relative: 9.8 % (ref 3.0–12.0)
Neutro Abs: 4.6 10*3/uL (ref 1.4–7.7)
Neutrophils Relative %: 65.3 % (ref 43.0–77.0)
RBC: 4.68 Mil/uL (ref 4.22–5.81)
RDW: 13.8 % (ref 11.5–14.6)
WBC: 7 10*3/uL (ref 4.5–10.5)

## 2013-09-12 LAB — HEPATIC FUNCTION PANEL
ALT: 43 U/L (ref 0–53)
Albumin: 3.7 g/dL (ref 3.5–5.2)
Total Bilirubin: 0.7 mg/dL (ref 0.3–1.2)
Total Protein: 6.1 g/dL (ref 6.0–8.3)

## 2013-09-12 LAB — BASIC METABOLIC PANEL
BUN: 21 mg/dL (ref 6–23)
CO2: 29 mEq/L (ref 19–32)
Calcium: 9.1 mg/dL (ref 8.4–10.5)
Creatinine, Ser: 1.3 mg/dL (ref 0.4–1.5)
Glucose, Bld: 97 mg/dL (ref 70–99)

## 2013-09-12 LAB — VITAMIN B12: Vitamin B-12: 329 pg/mL (ref 211–911)

## 2013-09-12 LAB — CK: Total CK: 2002 U/L — ABNORMAL HIGH (ref 7–232)

## 2013-09-12 NOTE — Assessment & Plan Note (Signed)
  R/o Lambert-Eaton syndrome Lab Chest CT due to hemoptysis Pulm ref --?bronchoscopy need

## 2013-09-12 NOTE — Progress Notes (Signed)
Pre-visit discussion using our clinic review tool. No additional management support is needed unless otherwise documented below in the visit note.  

## 2013-09-12 NOTE — Assessment & Plan Note (Signed)
11/14 LE>UE symmetric R/o Lambert-Eaton Lab Chest CT - hemoptysis Hold Pravasttin

## 2013-09-12 NOTE — Progress Notes (Signed)
   Subjective:     HPI C/o URI sx's x 2-3 wk - he has been coughing streaks of blood x 2-3 wks. Abx did not help C/o weakness in B legs and shoulders x 1 wk C/o fatigue x 2 wk C/o DOE. No CP  Review of Systems  Constitutional: Positive for fatigue. Negative for fever, appetite change and unexpected weight change.  HENT: Negative for congestion, nosebleeds, sneezing, sore throat and trouble swallowing.   Eyes: Negative for itching and visual disturbance.  Respiratory: Positive for cough and shortness of breath.   Cardiovascular: Negative for chest pain, palpitations and leg swelling.  Gastrointestinal: Negative for nausea, diarrhea, blood in stool and abdominal distention.  Genitourinary: Negative for dysuria, urgency, frequency and hematuria.  Musculoskeletal: Negative for back pain, gait problem, joint swelling and neck pain.  Skin: Negative for rash.  Neurological: Positive for dizziness, weakness and light-headedness. Negative for tremors, speech difficulty and numbness.  Psychiatric/Behavioral: Negative for suicidal ideas, sleep disturbance, self-injury, dysphoric mood and agitation. The patient is not nervous/anxious.        Objective:   Physical Exam  Constitutional: He is oriented to person, place, and time. He appears well-developed.  HENT:  Mouth/Throat: Oropharynx is clear and moist.  Eyes: Conjunctivae are normal. Pupils are equal, round, and reactive to light.  Neck: Normal range of motion. No JVD present. No thyromegaly present.  Cardiovascular: Normal rate, regular rhythm, normal heart sounds and intact distal pulses.  Exam reveals no gallop and no friction rub.   No murmur heard. Pulmonary/Chest: Effort normal. No respiratory distress. He has wheezes. He has no rales. He exhibits no tenderness.  Abdominal: Soft. Bowel sounds are normal. He exhibits no distension and no mass. There is no tenderness. There is no rebound and no guarding.  Musculoskeletal: Normal range  of motion. He exhibits no edema and no tenderness.  Lymphadenopathy:    He has no cervical adenopathy.  Neurological: He is alert and oriented to person, place, and time. He has normal reflexes. No cranial nerve deficit. He exhibits abnormal muscle tone. Coordination normal.  Skin: Skin is warm and dry. No rash noted.  Psychiatric: He has a normal mood and affect. His behavior is normal. Judgment and thought content normal.  hip flexors 4/5 B MS Shoulders 5-/5 B MS DTRs are brisk Grips 5/5 B  Lab Results  Component Value Date   WBC 8.8 01/28/2013   HGB 13.6 01/28/2013   HCT 39.1 01/28/2013   PLT 247 01/28/2013   GLUCOSE 152* 01/28/2013   CHOL 141 03/22/2012   TRIG 87.0 03/22/2012   HDL 43.80 03/22/2012   LDLCALC 80 03/22/2012   ALT 15 01/28/2013   AST 19 01/28/2013   NA 138 01/28/2013   K 3.8 01/28/2013   CL 101 01/28/2013   CREATININE 1.32 01/28/2013   BUN 21 01/28/2013   CO2 27 01/28/2013   TSH 2.82 01/14/2013   PSA 1.45 02/19/2007   INR 0.96 01/29/2013        Assessment & Plan:

## 2013-09-12 NOTE — Assessment & Plan Note (Signed)
Continue with current prescription therapy as reflected on the Med list.  

## 2013-09-12 NOTE — Patient Instructions (Signed)
Hold Pravastatin.

## 2013-09-13 ENCOUNTER — Other Ambulatory Visit: Payer: Self-pay | Admitting: Internal Medicine

## 2013-09-14 ENCOUNTER — Institutional Professional Consult (permissible substitution): Payer: Medicare Other | Admitting: Internal Medicine

## 2013-09-16 ENCOUNTER — Ambulatory Visit (INDEPENDENT_AMBULATORY_CARE_PROVIDER_SITE_OTHER): Payer: Medicare Other | Admitting: Internal Medicine

## 2013-09-16 ENCOUNTER — Telehealth: Payer: Self-pay | Admitting: *Deleted

## 2013-09-16 ENCOUNTER — Other Ambulatory Visit: Payer: Medicare Other

## 2013-09-16 ENCOUNTER — Encounter: Payer: Self-pay | Admitting: Internal Medicine

## 2013-09-16 ENCOUNTER — Ambulatory Visit (INDEPENDENT_AMBULATORY_CARE_PROVIDER_SITE_OTHER): Payer: Medicare Other

## 2013-09-16 VITALS — BP 150/82 | HR 80 | Temp 96.5°F | Resp 16 | Wt 143.0 lb

## 2013-09-16 DIAGNOSIS — M545 Low back pain, unspecified: Secondary | ICD-10-CM

## 2013-09-16 DIAGNOSIS — R21 Rash and other nonspecific skin eruption: Secondary | ICD-10-CM

## 2013-09-16 DIAGNOSIS — M6281 Muscle weakness (generalized): Secondary | ICD-10-CM

## 2013-09-16 LAB — BASIC METABOLIC PANEL
BUN: 21 mg/dL (ref 6–23)
Chloride: 101 mEq/L (ref 96–112)
Creatinine, Ser: 1.3 mg/dL (ref 0.4–1.5)
GFR: 60.39 mL/min (ref >60.00)
Sodium: 136 mEq/L (ref 135–145)

## 2013-09-16 LAB — SEDIMENTATION RATE: Sed Rate: 16 mm/hr (ref 0–22)

## 2013-09-16 MED ORDER — TRIAMCINOLONE ACETONIDE 0.5 % EX CREA: 1.0000 "application " | TOPICAL_CREAM | Freq: Three times a day (TID) | CUTANEOUS | Status: DC

## 2013-09-16 MED ORDER — METHYLPREDNISOLONE ACETATE 80 MG/ML IJ SUSP
80.0000 mg | Freq: Once | INTRAMUSCULAR | Status: AC
  Administered 2013-09-16: 80 mg via INTRAMUSCULAR

## 2013-09-16 MED ORDER — COENZYME Q10 30 MG PO CAPS: 30.0000 mg | ORAL_CAPSULE | Freq: Three times a day (TID) | ORAL | Status: DC

## 2013-09-16 MED ORDER — OXYCODONE-ACETAMINOPHEN 5-325 MG PO TABS: 1.0000 | ORAL_TABLET | Freq: Four times a day (QID) | ORAL | Status: DC | PRN

## 2013-09-16 NOTE — Assessment & Plan Note (Signed)
11/14 LE>UE symmetric R/o Lambert-Eaton ELV CK 2002  CT, Pulm cons on Mon Repeat labs To ER if not better

## 2013-09-16 NOTE — Patient Instructions (Signed)
Go to ER if worse 

## 2013-09-16 NOTE — Progress Notes (Signed)
   Subjective:     HPI C/o URI sx's x 2-3 wk - he has been coughing streaks of blood x 2-3 wks. Abx did not help - took Augmentin C/o weakness in B legs and shoulders x 1 wk - no worse C/o fatigue x 2-3 wk C/o DOE. No CP C/o rash on face and trunk x 4-5 d C/o LBP Urine is clear  Review of Systems  Constitutional: Positive for fatigue. Negative for fever, appetite change and unexpected weight change.  HENT: Negative for congestion, nosebleeds, sneezing, sore throat and trouble swallowing.   Eyes: Negative for itching and visual disturbance.  Respiratory: Positive for cough and shortness of breath.   Cardiovascular: Negative for chest pain, palpitations and leg swelling.  Gastrointestinal: Negative for nausea, diarrhea, blood in stool and abdominal distention.  Genitourinary: Negative for dysuria, urgency, frequency and hematuria.  Musculoskeletal: Negative for back pain, gait problem, joint swelling and neck pain.  Skin: Negative for rash.  Neurological: Positive for dizziness, weakness and light-headedness. Negative for tremors, speech difficulty and numbness.  Psychiatric/Behavioral: Negative for suicidal ideas, sleep disturbance, self-injury, dysphoric mood and agitation. The patient is not nervous/anxious.        Objective:   Physical Exam  Constitutional: He is oriented to person, place, and time. He appears well-developed.  HENT:  Mouth/Throat: Oropharynx is clear and moist.  Eyes: Conjunctivae are normal. Pupils are equal, round, and reactive to light.  Neck: Normal range of motion. No JVD present. No thyromegaly present.  Cardiovascular: Normal rate, regular rhythm, normal heart sounds and intact distal pulses.  Exam reveals no gallop and no friction rub.   No murmur heard. Pulmonary/Chest: Effort normal. No respiratory distress. He has wheezes. He has no rales. He exhibits no tenderness.  Abdominal: Soft. Bowel sounds are normal. He exhibits no distension and no mass.  There is no tenderness. There is no rebound and no guarding.  Musculoskeletal: Normal range of motion. He exhibits no edema and no tenderness.  Lymphadenopathy:    He has no cervical adenopathy.  Neurological: He is alert and oriented to person, place, and time. He has normal reflexes. No cranial nerve deficit. He exhibits abnormal muscle tone. Coordination normal.  Skin: Skin is warm and dry. No rash noted.  Psychiatric: He has a normal mood and affect. His behavior is normal. Judgment and thought content normal.  hip flexors 4/5 B MS Shoulders 5-/5 B MS LS is tender DTRs are brisk Grips 5/5 B Eryth papular rash - forehead, trunk -  Lab Results  Component Value Date   WBC 7.0 09/12/2013   HGB 15.2 09/12/2013   HCT 45.4 09/12/2013   PLT 213.0 09/12/2013   GLUCOSE 97 09/12/2013   CHOL 141 03/22/2012   TRIG 87.0 03/22/2012   HDL 43.80 03/22/2012   LDLCALC 80 03/22/2012   ALT 43 09/12/2013   AST 91* 09/12/2013   NA 139 09/12/2013   K 4.3 09/12/2013   CL 102 09/12/2013   CREATININE 1.3 09/12/2013   BUN 21 09/12/2013   CO2 29 09/12/2013   TSH 2.04 09/12/2013   PSA 1.45 02/19/2007   INR 0.96 01/29/2013        Assessment & Plan:

## 2013-09-16 NOTE — Assessment & Plan Note (Signed)
Percocet prn 

## 2013-09-16 NOTE — Progress Notes (Signed)
Pre visit review using our clinic review tool, if applicable. No additional management support is needed unless otherwise documented below in the visit note. 

## 2013-09-16 NOTE — Telephone Encounter (Signed)
OK to work in this pm if needed Thx

## 2013-09-16 NOTE — Telephone Encounter (Signed)
Pt scheduled this afternoon.

## 2013-09-16 NOTE — Telephone Encounter (Signed)
pts wife called states pt is not resting, pt is having to be lifted in the bed.  She attempted to make appoint for today however there is no availability.  Please advise

## 2013-09-16 NOTE — Assessment & Plan Note (Addendum)
11/14 face, trunk - a reaction to Augmentin vs other (i.e. a paraneoplastic syndrome)  Depomedrol 80 mg im Triamc cream tid

## 2013-09-19 ENCOUNTER — Encounter: Payer: Self-pay | Admitting: Internal Medicine

## 2013-09-19 ENCOUNTER — Other Ambulatory Visit: Payer: Medicare Other

## 2013-09-19 ENCOUNTER — Ambulatory Visit (INDEPENDENT_AMBULATORY_CARE_PROVIDER_SITE_OTHER): Payer: Medicare Other | Admitting: Internal Medicine

## 2013-09-19 ENCOUNTER — Ambulatory Visit (INDEPENDENT_AMBULATORY_CARE_PROVIDER_SITE_OTHER)
Admission: RE | Admit: 2013-09-19 | Discharge: 2013-09-19 | Disposition: A | Discharge status: Home / Self Care | Payer: Medicare Other | Source: Ambulatory Visit | Attending: Internal Medicine | Admitting: Internal Medicine

## 2013-09-19 VITALS — BP 130/80 | HR 70 | Temp 98.0°F | Ht 68.0 in | Wt 143.0 lb

## 2013-09-19 DIAGNOSIS — R042 Hemoptysis: Secondary | ICD-10-CM

## 2013-09-19 DIAGNOSIS — J449 Chronic obstructive pulmonary disease, unspecified: Secondary | ICD-10-CM

## 2013-09-19 DIAGNOSIS — M6281 Muscle weakness (generalized): Secondary | ICD-10-CM

## 2013-09-19 LAB — URINALYSIS
Bilirubin Urine: NEGATIVE
Hgb urine dipstick: NEGATIVE
Ketones, ur: NEGATIVE
Leukocytes, UA: NEGATIVE
Specific Gravity, Urine: >=1.03 (ref 1.000–1.030)
Urine Glucose: NEGATIVE
Urobilinogen, UA: 0.2 (ref 0.0–1.0)

## 2013-09-19 LAB — CK: Total CK: 6822 U/L — ABNORMAL HIGH (ref 7–232)

## 2013-09-19 LAB — MYOGLOBIN, URINE

## 2013-09-19 MED ORDER — IOHEXOL 300 MG/ML  SOLN
80.0000 mL | Freq: Once | INTRAMUSCULAR | Status: AC | PRN
  Administered 2013-09-19: 80 mL via INTRAVENOUS

## 2013-09-19 NOTE — Progress Notes (Signed)
  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  No obvious day to day or daytime variabilty or assoc new sob or cp or chest tightness, subjective wheeze overt sinus or hb symptoms. No unusual exp hx or h/o childhood pna/ asthma or knowledge of premature birth.  Sleeping ok without nocturnal  or early am exacerbation  of respiratory  c/o's or need for noct saba. Also denies any obvious fluctuation of symptoms with weather or environmental changes or other aggravating or alleviating factors except as outlined above   Current Medications, Allergies, Complete Past Medical History, Past Surgical History, Family History, and Social History were reviewed in [employer] record.  ROS  The following are not active complaints unless bolded sore throat, dysphagia, dental problems, itching, sneezing,  nasal congestion or excess/ purulent secretions, ear ache,   fever, chills, sweats, unintended wt loss, pleuritic or exertional cp, hemoptysis,  orthopnea pnd or leg swelling, presyncope, palpitations, heartburn, abdominal pain, anorexia, nausea, vomiting, diarrhea  or change in bowel or urinary habits, change in stools or urine, dysuria,hematuria,  rash, arthralgias, visual complaints, headache, numbness weakness or ataxia or problems with walking or coordination,  change in mood/affect or memory.       Review of Systems  Constitutional: Positive for appetite change. Negative for fever, chills, activity change and unexpected weight change.  HENT: Positive for congestion and rhinorrhea. Negative for  dental problem, postnasal drip, sneezing, sore throat, trouble swallowing and voice change.   Eyes: Negative for visual disturbance.  Respiratory: Positive for cough and shortness of breath. Negative for choking.   Cardiovascular: Negative for chest pain and leg swelling.  Gastrointestinal: Positive for abdominal pain. Negative for nausea and vomiting.  Genitourinary: Negative for difficulty urinating.  Musculoskeletal: Positive for arthralgias.  Skin: Positive for rash.  Psychiatric/Behavioral: Negative for behavioral problems and confusion.       Objective:   Physical Exam  amb wm nad  Wt Readings from Last 3 Encounters:  09/19/13 143 lb (64.864 kg)  09/16/13 143 lb (64.864 kg)  09/12/13 146 lb (66.225 kg)      HEENT mild turbinate edema.  Oropharynx no thrush or excess pnd or cobblestoning.  No JVD or cervical adenopathy. Mild accessory muscle hypertrophy. Trachea midline, nl thryroid. Chest was hyperinflated by percussion with diminished breath sounds and moderate increased exp time without wheeze. Hoover sign positive at mid inspiration. Regular rate and rhythm without murmur gallop or rub or increase P2 or edema.  Abd: no hsm, nl excursion. Ext warm without cyanosis or clubbing.    CT chest 09/19/13 2.5 cm air superior segment left lower lobe pulmonary mass which  appears to have and endobronchial component and is likely the cause  for the patient's hemoptysis. This is consistent with a primary lung  neoplasm.  Associated subcarinal lymphadenopathy.  Severe emphysematous changes.       Assessment & Plan:

## 2013-09-19 NOTE — Patient Instructions (Signed)
Come to outpatient registration at 715 am  Wednesday the 09/21/13 with nothing to eat or drink and no aspirin in the meantime for bronchoscopy   Nothing to eat after Tuesday midnight

## 2013-09-20 ENCOUNTER — Telehealth: Payer: Self-pay | Admitting: *Deleted

## 2013-09-20 ENCOUNTER — Encounter (HOSPITAL_COMMUNITY): Payer: Self-pay

## 2013-09-20 LAB — ALDOLASE: Aldolase: 59.7 U/L — ABNORMAL HIGH (ref <=8.1)

## 2013-09-20 NOTE — Telephone Encounter (Signed)
Myoglobin, Urine test was not performed because the specimen received at lab was not in proper tube and the pH level was too low.

## 2013-09-20 NOTE — Telephone Encounter (Signed)
Noted. Thx.

## 2013-09-21 ENCOUNTER — Ambulatory Visit (HOSPITAL_COMMUNITY)
Admission: RE | Admit: 2013-09-21 | Discharge: 2013-09-21 | Disposition: A | Discharge status: Home / Self Care | Payer: Medicare Other | Source: Ambulatory Visit | Attending: Internal Medicine | Admitting: Internal Medicine

## 2013-09-21 ENCOUNTER — Encounter: Payer: Self-pay | Admitting: Internal Medicine

## 2013-09-21 ENCOUNTER — Encounter (HOSPITAL_COMMUNITY)
Admission: RE | Disposition: A | Discharge status: Home / Self Care | Payer: Self-pay | Source: Ambulatory Visit | Attending: Internal Medicine

## 2013-09-21 DIAGNOSIS — R222 Localized swelling, mass and lump, trunk: Secondary | ICD-10-CM | POA: Insufficient documentation

## 2013-09-21 DIAGNOSIS — Z87891 Personal history of nicotine dependence: Secondary | ICD-10-CM | POA: Insufficient documentation

## 2013-09-21 DIAGNOSIS — R599 Enlarged lymph nodes, unspecified: Secondary | ICD-10-CM | POA: Insufficient documentation

## 2013-09-21 DIAGNOSIS — R042 Hemoptysis: Secondary | ICD-10-CM | POA: Insufficient documentation

## 2013-09-21 HISTORY — PX: VIDEO BRONCHOSCOPY: SHX5072

## 2013-09-21 SURGERY — VIDEO BRONCHOSCOPY WITHOUT FLUORO
Anesthesia: Moderate Sedation | Laterality: Bilateral

## 2013-09-21 MED ORDER — MIDAZOLAM HCL 5 MG/ML IJ SOLN: 1.0000 mg | Freq: Once | INTRAMUSCULAR | Status: DC

## 2013-09-21 MED ORDER — PHENYLEPHRINE HCL 0.25 % NA SOLN
1.0000 | Freq: Four times a day (QID) | NASAL | Status: DC | PRN
  Filled 2013-09-21: qty 15

## 2013-09-21 MED ORDER — LIDOCAINE HCL 2 % EX GEL
Freq: Once | CUTANEOUS | Status: DC
  Filled 2013-09-21: qty 5

## 2013-09-21 MED ORDER — MEPERIDINE HCL 25 MG/ML IJ SOLN
INTRAMUSCULAR | Status: DC | PRN
  Administered 2013-09-21: 25 mg via INTRAVENOUS

## 2013-09-21 MED ORDER — MIDAZOLAM HCL 10 MG/2ML IJ SOLN
INTRAMUSCULAR | Status: DC | PRN
  Administered 2013-09-21: 2.5 mg via INTRAVENOUS

## 2013-09-21 MED ORDER — LIDOCAINE HCL 2 % EX GEL
CUTANEOUS | Status: DC | PRN
  Administered 2013-09-21: 1

## 2013-09-21 MED ORDER — SODIUM CHLORIDE 0.9 % IV SOLN
INTRAVENOUS | Status: DC
  Administered 2013-09-21: 08:00:00 via INTRAVENOUS

## 2013-09-21 MED ORDER — LIDOCAINE HCL (PF) 1 % IJ SOLN
INTRAMUSCULAR | Status: DC | PRN
  Administered 2013-09-21: 3 mL
  Administered 2013-09-21: 6 mL

## 2013-09-21 MED ORDER — MEPERIDINE HCL 100 MG/ML IJ SOLN
INTRAMUSCULAR | Status: AC
  Filled 2013-09-21: qty 2

## 2013-09-21 MED ORDER — PHENYLEPHRINE HCL 0.25 % NA SOLN
NASAL | Status: DC | PRN
  Administered 2013-09-21: 2 via NASAL

## 2013-09-21 MED ORDER — MIDAZOLAM HCL 10 MG/2ML IJ SOLN
INTRAMUSCULAR | Status: AC
  Filled 2013-09-21: qty 4

## 2013-09-21 MED ORDER — MIDAZOLAM HCL 10 MG/2ML IJ SOLN: 1.0000 mg | Freq: Once | INTRAMUSCULAR | Status: DC

## 2013-09-21 MED ORDER — MEPERIDINE HCL 100 MG/ML IJ SOLN: 100.0000 mg | Freq: Once | INTRAMUSCULAR | Status: DC

## 2013-09-21 NOTE — Progress Notes (Signed)
Video bronchoscopy procedure performed. Bronchial washings intervention performed. Biopsy intervention performed.

## 2013-09-21 NOTE — Progress Notes (Signed)
done

## 2013-09-21 NOTE — Assessment & Plan Note (Signed)
PFT's 08/10/09  FEV1  1.43 (50%) ratio 37 but 16% better p B2 and DLCO55  Adequate control on present rx, reviewed > no change in rx needed , not a problem for fob though doubt could tol LLLobectomy

## 2013-09-21 NOTE — Assessment & Plan Note (Addendum)
CT reviewed and suggests endobronchial dz at LLL orifice.  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   Have advised for no ASA pending fob

## 2013-09-21 NOTE — H&P (Addendum)
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 Sputum never purulent prior to onset of hemoptysis, worst in ams and never more than a few tbsp per day  No obvious day to day or daytime variabilty or assoc sob or cp or chest tightness, subjective wheeze overt sinus or hb symptoms. No unusual exp hx or h/o childhood pna/ asthma or knowledge of premature birth.  Sleeping ok without nocturnal  or early am exacerbation  of respiratory  c/o's or need for noct saba. Also denies any obvious fluctuation of symptoms with weather or environmental changes or other aggravating or alleviating factors except as outlined above   Current Medications, Allergies, Complete Past Medical History, Past Surgical History, Family History, and Social History were reviewed in [facility] electronic medical record.          Review of Systems  Constitutional: Positive for appetite change. Negative for fever, chills, activity change and unexpected weight change.  HENT: Positive for congestion and rhinorrhea. Negative for dental problem, postnasal drip, sneezing, sore throat, trouble swallowing and voice change.   Eyes: Negative for visual disturbance.  Respiratory: Positive for cough and shortness of breath. Negative for choking.   Cardiovascular: Negative for chest pain and leg swelling.  Gastrointestinal: Positive for abdominal pain. Negative for nausea and vomiting.  Genitourinary: Negative for difficulty urinating.  Musculoskeletal: Positive for arthralgias.  Skin: Positive for rash.  Psychiatric/Behavioral: Negative for behavioral problems and confusion.          Objective:     Physical Exam   amb wm nad    Wt Readings from Last 3 Encounters:   09/19/13  143 lb (64.864 kg)   09/16/13  143 lb (64.864 kg)   09/12/13  146 lb (66.225 kg)          HEENT mild turbinate edema.  Oropharynx no thrush or excess pnd or cobblestoning.  No JVD or cervical adenopathy. Mild accessory muscle hypertrophy. Trachea midline, nl thryroid. Chest was hyperinflated by percussion with diminished breath sounds and moderate increased exp time without wheeze. Hoover sign positive at mid inspiration. Regular rate and rhythm without murmur gallop or rub or increase P2 or edema.  Abd: no hsm, nl excursion. Ext warm without cyanosis or clubbing.      CT chest 09/19/13 2.5 cm air superior segment left lower lobe pulmonary mass which   appears to have and endobronchial component and is likely the cause   for the patient's hemoptysis. This is consistent with a primary lung   neoplasm.   Associated subcarinal lymphadenopathy.   Severe emphysematous changes.           Assessment & Plan:        Imp: LLL mass assoc with new onset hemoptysis in pt with Pos PPD much more likely this is a tumor than TB so proceed with FOB asap and will include AFB studies  Discussed in detail all the  indications, usual  risks and alternatives  relative to the benefits with patient who agrees to proceed with bronchoscopy with biopsy.   .      09/21/2013 day   of FOB interim hx and exam: No change, still traces hemoptysis, no resting sob     Faraz Ponciano, MD Pulmonary and Critical Care Medicine [facility] Cell [phone] After 5:30 PM or weekends, call [phone]                            

## 2013-09-21 NOTE — Op Note (Signed)
Bronchoscopy Procedure Note  Date of Operation: 09/21/2013   Pre-op Diagnosis: hemoptysis, lung nodule   Post-op Diagnosis: same  Surgeon: Sandrea Hughs  Anesthesia: Monitored Local Anesthesia with Sedation  Operation: Video Flexible fiberoptic bronchoscopy, diagnostic   Findings: minimal swelling at LLL Sup segment orifice   Specimen: Bronchial Washings/ Endobronchial bx's LLL sup segment  Estimated Blood Loss: minimal  Complications: none  Indications and History: See updated H and P same date. The risks, benefits, complications, treatment options and expected outcomes were discussed with the patient.  The possibilities of reaction to medication, pulmonary aspiration, perforation of a viscus, bleeding, failure to diagnose a condition and creating a complication requiring transfusion or operation were discussed with the patient who freely signed the consent.    Description of Procedure: The patient was re-examined in the bronchoscopy suite and the site of surgery properly noted/marked.  The patient was identified  and the procedure verified as Flexible Fiberoptic Bronchoscopy.  A Time Out was held and the above information confirmed.   After the induction of topical nasopharyngeal anesthesia, the patient was positioned  and the bronchoscope was passed through the R  naris. The vocal cords were visualized and  1% buffered lidocaine 5 ml was topically placed onto the cords. The cords were nl. The scope was then passed into the trachea.  1% buffered lidocaine given topically. Airways inspected bilaterally to the subsegmental level with the following findings:  All airways and airway dividers including the carina were nl There was minimal lumpy tissue at Sup subsegmental orifice bx x4   The Patient was taken to the Endoscopy Recovery area in satisfactory condition.  Attestation: I performed the procedure.  Sandrea Hughs, MD Pulmonary and Critical Care Medicine [facility] Cell [phone] After 5:30 PM or weekends, call [phone]

## 2013-09-23 ENCOUNTER — Encounter (HOSPITAL_COMMUNITY): Payer: Self-pay | Admitting: Internal Medicine

## 2013-09-26 ENCOUNTER — Other Ambulatory Visit: Payer: Self-pay | Admitting: Internal Medicine

## 2013-09-26 ENCOUNTER — Encounter: Payer: Self-pay | Admitting: Internal Medicine

## 2013-09-26 DIAGNOSIS — J984 Other disorders of lung: Secondary | ICD-10-CM

## 2013-09-26 DIAGNOSIS — R042 Hemoptysis: Secondary | ICD-10-CM

## 2013-09-27 ENCOUNTER — Encounter: Payer: Self-pay | Admitting: Internal Medicine

## 2013-09-27 ENCOUNTER — Ambulatory Visit (INDEPENDENT_AMBULATORY_CARE_PROVIDER_SITE_OTHER): Payer: Medicare Other | Admitting: Internal Medicine

## 2013-09-27 ENCOUNTER — Ambulatory Visit: Payer: Medicare Other

## 2013-09-27 VITALS — BP 100/70 | HR 80 | Temp 97.4°F | Resp 16 | Wt 142.0 lb

## 2013-09-27 DIAGNOSIS — R930 Abnormal findings on diagnostic imaging of skull and head, not elsewhere classified: Secondary | ICD-10-CM

## 2013-09-27 DIAGNOSIS — R634 Abnormal weight loss: Secondary | ICD-10-CM

## 2013-09-27 DIAGNOSIS — R21 Rash and other nonspecific skin eruption: Secondary | ICD-10-CM

## 2013-09-27 DIAGNOSIS — M6281 Muscle weakness (generalized): Secondary | ICD-10-CM

## 2013-09-27 DIAGNOSIS — R110 Nausea: Secondary | ICD-10-CM

## 2013-09-27 DIAGNOSIS — R042 Hemoptysis: Secondary | ICD-10-CM

## 2013-09-27 MED ORDER — CLONAZEPAM 0.25 MG PO TBDP: 0.2500 mg | ORAL_TABLET | Freq: Every evening | ORAL | Status: DC | PRN

## 2013-09-27 MED ORDER — TRAMADOL HCL 50 MG PO TABS: 50.0000 mg | ORAL_TABLET | Freq: Two times a day (BID) | ORAL | Status: DC | PRN

## 2013-09-27 MED ORDER — PREDNISONE 10 MG PO TABS: ORAL_TABLET | ORAL | Status: DC

## 2013-09-27 NOTE — Progress Notes (Signed)
  Subjective:     HPI C/o weakness, not eating, no appetite, nausea C/o weakness in B legs and shoulders x 1 wk - no worse C/o fatigue x 4-5 wk C/o DOE. No CP C/o rash on face and trunk - recurrent C/o LBP Urine is clear  Review of Systems  Constitutional: Positive for fatigue. Negative for fever, appetite change and unexpected weight change.  HENT: Negative for congestion, nosebleeds, sneezing, sore throat and trouble swallowing.   Eyes: Negative for itching and visual disturbance.  Respiratory: Positive for cough and shortness of breath.   Cardiovascular: Negative for chest pain, palpitations and leg swelling.  Gastrointestinal: Negative for nausea, diarrhea, blood in stool and abdominal distention.  Genitourinary: Negative for dysuria, urgency, frequency and hematuria.  Musculoskeletal: Negative for back pain, gait problem, joint swelling and neck pain.  Skin: Negative for rash.  Neurological: Positive for dizziness, weakness and light-headedness. Negative for tremors, speech difficulty and numbness.  Psychiatric/Behavioral: Negative for suicidal ideas, sleep disturbance, self-injury, dysphoric mood and agitation. The patient is not nervous/anxious.        Objective:   Physical Exam  Constitutional: He is oriented to person, place, and time. He appears well-developed.  HENT:  Mouth/Throat: Oropharynx is clear and moist.  Eyes: Conjunctivae are normal. Pupils are equal, round, and reactive to light.  Neck: Normal range of motion. No JVD present. No thyromegaly present.  Cardiovascular: Normal rate, regular rhythm, normal heart sounds and intact distal pulses.  Exam reveals no gallop and no friction rub.   No murmur heard. Pulmonary/Chest: Effort normal. No respiratory distress. He has wheezes. He has no rales. He exhibits no tenderness.  Abdominal: Soft. Bowel sounds are normal. He exhibits no distension and no mass. There is no tenderness. There is no rebound and no guarding.   Musculoskeletal: Normal range of motion. He exhibits no edema and no tenderness.  Lymphadenopathy:    He has no cervical adenopathy.  Neurological: He is alert and oriented to person, place, and time. He has normal reflexes. No cranial nerve deficit. He exhibits abnormal muscle tone. Coordination normal.  Skin: Skin is warm and dry. No rash noted.  Psychiatric: He has a normal mood and affect. His behavior is normal. Judgment and thought content normal.  hip flexors 4/5 B MS Shoulders 5-/5 B MS LS is tender DTRs are brisk Grips 5/5 B Eryth papular rash - forehead, trunk -  Lab Results  Component Value Date   WBC 7.0 09/12/2013   HGB 15.2 09/12/2013   HCT 45.4 09/12/2013   PLT 213.0 09/12/2013   GLUCOSE 81 09/16/2013   CHOL 141 03/22/2012   TRIG 87.0 03/22/2012   HDL 43.80 03/22/2012   LDLCALC 80 03/22/2012   ALT 43 09/12/2013   AST 91* 09/12/2013   NA 136 09/16/2013   K 4.5 09/16/2013   CL 101 09/16/2013   CREATININE 1.3 09/16/2013   BUN 21 09/16/2013   CO2 31 09/16/2013   TSH 2.04 09/12/2013   PSA 1.45 02/19/2007   INR 0.96 01/29/2013        Assessment & Plan:

## 2013-09-27 NOTE — Assessment & Plan Note (Signed)
11/14 face, trunk -    R/o ???dermatomyosistis  Worse

## 2013-09-27 NOTE — Assessment & Plan Note (Signed)
Resolved

## 2013-09-27 NOTE — Assessment & Plan Note (Signed)
PET scan was ordered.

## 2013-09-27 NOTE — Assessment & Plan Note (Signed)
11/14 LE>UE symmetric R/o Lambert-Eaton  R/o ???dermatomyosistis ELV CK 2002  PET scan is pending

## 2013-09-27 NOTE — Assessment & Plan Note (Signed)
Wt Readings from Last 3 Encounters:  09/27/13 142 lb (64.411 kg)  09/19/13 143 lb (64.864 kg)  09/16/13 143 lb (64.864 kg)  No appetite

## 2013-09-27 NOTE — Progress Notes (Signed)
Pre visit review using our clinic review tool, if applicable. No additional management support is needed unless otherwise documented below in the visit note. 

## 2013-09-28 LAB — CBC WITH DIFFERENTIAL/PLATELET
Basophils Absolute: 0.1 10*3/uL (ref 0.0–0.1)
Eosinophils Relative: 1 % (ref 0–5)
Lymphocytes Relative: 16 % (ref 12–46)
Lymphs Abs: 1.8 10*3/uL (ref 0.7–4.0)
MCH: 32.6 pg (ref 26.0–34.0)
MCHC: 33.5 g/dL (ref 30.0–36.0)
MCV: 97.5 fL (ref 78.0–100.0)
Monocytes Absolute: 0.9 10*3/uL (ref 0.1–1.0)
Neutro Abs: 8.3 10*3/uL — ABNORMAL HIGH (ref 1.7–7.7)
Neutrophils Relative %: 74 % (ref 43–77)
Platelets: 257 10*3/uL (ref 150–400)
RBC: 4.72 MIL/uL (ref 4.22–5.81)
RDW: 13.7 % (ref 11.5–15.5)
WBC: 11.2 10*3/uL — ABNORMAL HIGH (ref 4.0–10.5)

## 2013-09-28 LAB — BASIC METABOLIC PANEL
BUN: 24 mg/dL — ABNORMAL HIGH (ref 6–23)
CO2: 31 mEq/L (ref 19–32)
Calcium: 8.7 mg/dL (ref 8.4–10.5)
Creatinine, Ser: 1.3 mg/dL (ref 0.4–1.5)
GFR: 58.75 mL/min — ABNORMAL LOW (ref >60.00)
Glucose, Bld: 112 mg/dL — ABNORMAL HIGH (ref 70–99)
Sodium: 134 mEq/L — ABNORMAL LOW (ref 135–145)

## 2013-09-28 LAB — SEDIMENTATION RATE: Sed Rate: 29 mm/hr — ABNORMAL HIGH (ref 0–22)

## 2013-09-28 LAB — CK: Total CK: 3687 U/L — ABNORMAL HIGH (ref 7–232)

## 2013-09-28 LAB — HEPATIC FUNCTION PANEL
ALT: 96 U/L — ABNORMAL HIGH (ref 0–53)
Albumin: 3 g/dL — ABNORMAL LOW (ref 3.5–5.2)
Alkaline Phosphatase: 52 U/L (ref 39–117)
Bilirubin, Direct: 0.1 mg/dL (ref 0.0–0.3)
Total Protein: 6 g/dL (ref 6.0–8.3)

## 2013-09-29 NOTE — Assessment & Plan Note (Signed)
LFTs Labs

## 2013-10-08 ENCOUNTER — Telehealth: Payer: Self-pay | Admitting: Pulmonary Disease

## 2013-10-08 MED ORDER — LEVOFLOXACIN 750 MG PO TABS: 750.0000 mg | ORAL_TABLET | Freq: Every day | ORAL | Status: DC

## 2013-10-08 NOTE — Telephone Encounter (Signed)
Pt complains of increased chest congestion, cough, and now purulent mucus.  Feels he is getting sick.  Has copd and recent bronch for lung mass.  Will send in abx for him for 5 days.

## 2013-10-10 ENCOUNTER — Other Ambulatory Visit: Payer: Self-pay

## 2013-10-10 MED ORDER — LOSARTAN POTASSIUM 50 MG PO TABS: 50.0000 mg | ORAL_TABLET | Freq: Two times a day (BID) | ORAL | Status: DC

## 2013-10-12 ENCOUNTER — Encounter (HOSPITAL_COMMUNITY)
Admission: RE | Admit: 2013-10-12 | Discharge: 2013-10-12 | Disposition: A | Discharge status: Home / Self Care | Payer: Medicare Other | Source: Ambulatory Visit | Attending: Internal Medicine | Admitting: Internal Medicine

## 2013-10-12 ENCOUNTER — Encounter (HOSPITAL_COMMUNITY): Payer: Self-pay

## 2013-10-12 ENCOUNTER — Encounter: Payer: Self-pay | Admitting: Internal Medicine

## 2013-10-12 ENCOUNTER — Other Ambulatory Visit: Payer: Self-pay | Admitting: *Deleted

## 2013-10-12 DIAGNOSIS — R911 Solitary pulmonary nodule: Secondary | ICD-10-CM | POA: Insufficient documentation

## 2013-10-12 DIAGNOSIS — C969 Malignant neoplasm of lymphoid, hematopoietic and related tissue, unspecified: Secondary | ICD-10-CM | POA: Insufficient documentation

## 2013-10-12 DIAGNOSIS — J984 Other disorders of lung: Secondary | ICD-10-CM

## 2013-10-12 DIAGNOSIS — R042 Hemoptysis: Secondary | ICD-10-CM | POA: Insufficient documentation

## 2013-10-12 MED ORDER — FLUDEOXYGLUCOSE F - 18 (FDG) INJECTION
19.3000 | Freq: Once | INTRAVENOUS | Status: AC | PRN
  Administered 2013-10-12: 19.3 via INTRAVENOUS

## 2013-10-12 MED ORDER — LOSARTAN POTASSIUM 50 MG PO TABS: 50.0000 mg | ORAL_TABLET | Freq: Two times a day (BID) | ORAL | Status: DC

## 2013-10-13 ENCOUNTER — Other Ambulatory Visit: Payer: Self-pay | Admitting: Emergency Medicine

## 2013-10-13 DIAGNOSIS — R590 Localized enlarged lymph nodes: Secondary | ICD-10-CM

## 2013-10-13 LAB — GLUCOSE, CAPILLARY: Glucose-Capillary: 114 mg/dL — ABNORMAL HIGH (ref 70–99)

## 2013-10-14 ENCOUNTER — Encounter (HOSPITAL_COMMUNITY): Payer: Self-pay | Admitting: Pharmacy Technician

## 2013-10-14 ENCOUNTER — Encounter (HOSPITAL_COMMUNITY): Payer: Self-pay | Admitting: *Deleted

## 2013-10-14 DIAGNOSIS — Z92241 Personal history of systemic steroid therapy: Secondary | ICD-10-CM

## 2013-10-14 DIAGNOSIS — J449 Chronic obstructive pulmonary disease, unspecified: Secondary | ICD-10-CM

## 2013-10-14 HISTORY — DX: Chronic obstructive pulmonary disease, unspecified: J44.9

## 2013-10-14 HISTORY — DX: Personal history of systemic steroid therapy: Z92.241

## 2013-10-16 ENCOUNTER — Encounter (HOSPITAL_COMMUNITY): Payer: Self-pay | Admitting: Anesthesiology

## 2013-10-16 NOTE — Anesthesia Preprocedure Evaluation (Addendum)
Anesthesia Evaluation  Patient identified by MRN, date of birth, ID band Patient awake    Reviewed: Allergy & Precautions, H&P , NPO status , Patient's Chart, lab work & pertinent test results  Airway Mallampati: II TM Distance: >3 FB Neck ROM: Full    Dental no notable dental hx.    Pulmonary shortness of breath, asthma , COPD COPD inhaler, former smoker,  breath sounds clear to auscultation  Pulmonary exam normal       Cardiovascular hypertension, Pt. on medications and Pt. on home beta blockers + CAD Rhythm:Regular Rate:Normal  ECG: SB, iRBBB   Neuro/Psych negative neurological ROS  negative psych ROS   GI/Hepatic Neg liver ROS, GERD-  Medicated,  Endo/Other  negative endocrine ROS  Renal/GU negative Renal ROS  negative genitourinary   Musculoskeletal negative musculoskeletal ROS (+)   Abdominal   Peds negative pediatric ROS (+)  Hematology negative hematology ROS (+)   Anesthesia Other Findings   Reproductive/Obstetrics negative OB ROS                          Anesthesia Physical Anesthesia Plan  ASA: IV  Anesthesia Plan: General   Post-op Pain Management:    Induction: Intravenous  Airway Management Planned: Oral ETT  Additional Equipment:   Intra-op Plan:   Post-operative Plan: Extubation in OR  Informed Consent: I have reviewed the patients History and Physical, chart, labs and discussed the procedure including the risks, benefits and alternatives for the proposed anesthesia with the patient or authorized representative who has indicated his/her understanding and acceptance.   Dental advisory given  Plan Discussed with: CRNA  Anesthesia Plan Comments:         Anesthesia Quick Evaluation

## 2013-10-17 ENCOUNTER — Ambulatory Visit (HOSPITAL_COMMUNITY)
Admission: RE | Admit: 2013-10-17 | Discharge: 2013-10-17 | Disposition: A | Discharge status: Home / Self Care | Payer: Medicare Other | Source: Ambulatory Visit | Attending: Emergency Medicine | Admitting: Emergency Medicine

## 2013-10-17 ENCOUNTER — Encounter (HOSPITAL_COMMUNITY)
Admission: RE | Disposition: A | Discharge status: Home / Self Care | Payer: Self-pay | Source: Ambulatory Visit | Attending: Emergency Medicine

## 2013-10-17 ENCOUNTER — Encounter (HOSPITAL_COMMUNITY): Payer: Medicare Other | Admitting: Anesthesiology

## 2013-10-17 ENCOUNTER — Encounter (HOSPITAL_COMMUNITY): Payer: Self-pay | Admitting: *Deleted

## 2013-10-17 ENCOUNTER — Ambulatory Visit (HOSPITAL_COMMUNITY): Payer: Medicare Other | Admitting: Anesthesiology

## 2013-10-17 ENCOUNTER — Encounter: Payer: Self-pay | Admitting: Internal Medicine

## 2013-10-17 DIAGNOSIS — I2510 Atherosclerotic heart disease of native coronary artery without angina pectoris: Secondary | ICD-10-CM | POA: Insufficient documentation

## 2013-10-17 DIAGNOSIS — C801 Malignant (primary) neoplasm, unspecified: Secondary | ICD-10-CM | POA: Insufficient documentation

## 2013-10-17 DIAGNOSIS — J984 Other disorders of lung: Secondary | ICD-10-CM | POA: Diagnosis present

## 2013-10-17 DIAGNOSIS — R042 Hemoptysis: Secondary | ICD-10-CM | POA: Insufficient documentation

## 2013-10-17 DIAGNOSIS — J4489 Other specified chronic obstructive pulmonary disease: Secondary | ICD-10-CM | POA: Insufficient documentation

## 2013-10-17 DIAGNOSIS — R918 Other nonspecific abnormal finding of lung field: Secondary | ICD-10-CM

## 2013-10-17 DIAGNOSIS — J449 Chronic obstructive pulmonary disease, unspecified: Secondary | ICD-10-CM | POA: Insufficient documentation

## 2013-10-17 DIAGNOSIS — R590 Localized enlarged lymph nodes: Secondary | ICD-10-CM | POA: Diagnosis present

## 2013-10-17 DIAGNOSIS — C771 Secondary and unspecified malignant neoplasm of intrathoracic lymph nodes: Secondary | ICD-10-CM | POA: Insufficient documentation

## 2013-10-17 DIAGNOSIS — K219 Gastro-esophageal reflux disease without esophagitis: Secondary | ICD-10-CM | POA: Insufficient documentation

## 2013-10-17 DIAGNOSIS — R599 Enlarged lymph nodes, unspecified: Secondary | ICD-10-CM

## 2013-10-17 DIAGNOSIS — I10 Essential (primary) hypertension: Secondary | ICD-10-CM | POA: Insufficient documentation

## 2013-10-17 DIAGNOSIS — R222 Localized swelling, mass and lump, trunk: Secondary | ICD-10-CM | POA: Insufficient documentation

## 2013-10-17 HISTORY — DX: Essential (primary) hypertension: I10

## 2013-10-17 HISTORY — PX: ENDOBRONCHIAL ULTRASOUND: SHX5096

## 2013-10-17 HISTORY — DX: Chronic obstructive pulmonary disease, unspecified: J44.9

## 2013-10-17 SURGERY — ENDOBRONCHIAL ULTRASOUND (EBUS)
Anesthesia: General | Laterality: Bilateral

## 2013-10-17 MED ORDER — ROCURONIUM BROMIDE 100 MG/10ML IV SOLN
INTRAVENOUS | Status: DC | PRN
  Administered 2013-10-17: 20 mg via INTRAVENOUS

## 2013-10-17 MED ORDER — PHENYLEPHRINE HCL 10 MG/ML IJ SOLN
10.0000 mg | INTRAVENOUS | Status: DC | PRN
  Administered 2013-10-17: 50 ug/min via INTRAVENOUS

## 2013-10-17 MED ORDER — FENTANYL CITRATE 0.05 MG/ML IJ SOLN
INTRAMUSCULAR | Status: AC
  Filled 2013-10-17: qty 2

## 2013-10-17 MED ORDER — PHENYLEPHRINE HCL 10 MG/ML IJ SOLN
INTRAMUSCULAR | Status: DC | PRN
  Administered 2013-10-17 (×2): 80 ug via INTRAVENOUS

## 2013-10-17 MED ORDER — NEOSTIGMINE METHYLSULFATE 1 MG/ML IJ SOLN
INTRAMUSCULAR | Status: DC | PRN
  Administered 2013-10-17: 3 mg via INTRAVENOUS

## 2013-10-17 MED ORDER — LIDOCAINE HCL (CARDIAC) 20 MG/ML IV SOLN
INTRAVENOUS | Status: DC | PRN
  Administered 2013-10-17: 50 mg via INTRAVENOUS

## 2013-10-17 MED ORDER — ONDANSETRON HCL 4 MG/2ML IJ SOLN
INTRAMUSCULAR | Status: DC | PRN
  Administered 2013-10-17: 4 mg via INTRAVENOUS

## 2013-10-17 MED ORDER — LIDOCAINE HCL (CARDIAC) 20 MG/ML IV SOLN
INTRAVENOUS | Status: AC
  Filled 2013-10-17: qty 5

## 2013-10-17 MED ORDER — PROPOFOL 10 MG/ML IV BOLUS
INTRAVENOUS | Status: AC
  Filled 2013-10-17: qty 20

## 2013-10-17 MED ORDER — LACTATED RINGERS IV SOLN
INTRAVENOUS | Status: DC
  Administered 2013-10-17: 1000 mL via INTRAVENOUS
  Administered 2013-10-17 (×2): via INTRAVENOUS

## 2013-10-17 MED ORDER — GLYCOPYRROLATE 0.2 MG/ML IJ SOLN
INTRAMUSCULAR | Status: DC | PRN
  Administered 2013-10-17: 0.4 mg via INTRAVENOUS

## 2013-10-17 MED ORDER — SUCCINYLCHOLINE CHLORIDE 20 MG/ML IJ SOLN
INTRAMUSCULAR | Status: AC
  Filled 2013-10-17: qty 1

## 2013-10-17 MED ORDER — ROCURONIUM BROMIDE 100 MG/10ML IV SOLN
INTRAVENOUS | Status: AC
  Filled 2013-10-17: qty 1

## 2013-10-17 MED ORDER — PROPOFOL 10 MG/ML IV BOLUS
INTRAVENOUS | Status: DC | PRN
  Administered 2013-10-17: 150 mg via INTRAVENOUS

## 2013-10-17 MED ORDER — SUCCINYLCHOLINE CHLORIDE 20 MG/ML IJ SOLN
INTRAMUSCULAR | Status: DC | PRN
  Administered 2013-10-17: 100 mg via INTRAVENOUS

## 2013-10-17 MED ORDER — FENTANYL CITRATE 0.05 MG/ML IJ SOLN
INTRAMUSCULAR | Status: DC | PRN
  Administered 2013-10-17 (×2): 50 ug via INTRAVENOUS

## 2013-10-17 MED ORDER — FENTANYL CITRATE 0.05 MG/ML IJ SOLN: 25.0000 ug | INTRAMUSCULAR | Status: DC | PRN

## 2013-10-17 NOTE — Anesthesia Postprocedure Evaluation (Signed)
  Anesthesia Post-op Note  Patient: Cameron [name]  Procedure(s) Performed: Procedure(s) (LRB): ENDOBRONCHIAL ULTRASOUND (Bilateral)  Patient Location: PACU  Anesthesia Type: General  Level of Consciousness: awake and alert   Airway and Oxygen Therapy: Patient Spontanous Breathing  Post-op Pain: mild  Post-op Assessment: Post-op Vital signs reviewed, Patient's Cardiovascular Status Stable, Respiratory Function Stable, Patent Airway and No signs of Nausea or vomiting  Last Vitals:  Filed Vitals:   10/17/13 1015  BP: 131/77  Pulse: 55  Temp:   Resp: 12    Post-op Vital Signs: stable   Complications: No apparent anesthesia complications

## 2013-10-17 NOTE — Interval H&P Note (Signed)
Addendum:  Incorrectly entered into exam airway score > he is M1-2 airway

## 2013-10-17 NOTE — H&P (View-Only) (Signed)
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 Sputum never purulent prior to onset of hemoptysis, worst in ams and never more than a few tbsp per day  No obvious day to day or daytime variabilty or assoc sob or cp or chest tightness, subjective wheeze overt sinus or hb symptoms. No unusual exp hx or h/o childhood pna/ asthma or knowledge of premature birth.  Sleeping ok without nocturnal  or early am exacerbation  of respiratory  c/o's or need for noct saba. Also denies any obvious fluctuation of symptoms with weather or environmental changes or other aggravating or alleviating factors except as outlined above   Current Medications, Allergies, Complete Past Medical History, Past Surgical History, Family History, and Social History were reviewed in [employer] record.          Review of Systems  Constitutional: Positive for appetite change. Negative for fever, chills, activity change and unexpected weight change.  HENT: Positive for congestion and rhinorrhea. Negative for dental problem, postnasal drip, sneezing, sore throat, trouble swallowing and voice change.   Eyes: Negative for visual disturbance.  Respiratory: Positive for cough and shortness of breath. Negative for choking.   Cardiovascular: Negative for chest pain and leg swelling.  Gastrointestinal: Positive for abdominal pain. Negative for nausea and vomiting.  Genitourinary: Negative for difficulty urinating.  Musculoskeletal: Positive for arthralgias.  Skin: Positive for rash.  Psychiatric/Behavioral: Negative for behavioral problems and confusion.          Objective:     Physical Exam   amb wm nad    Wt Readings from Last 3 Encounters:   09/19/13  143 lb (64.864 kg)   09/16/13  143 lb (64.864 kg)   09/12/13  146 lb (66.225 kg)          HEENT mild turbinate edema.  Oropharynx no thrush or excess pnd or cobblestoning.  No JVD or cervical adenopathy. Mild accessory muscle hypertrophy. Trachea midline, nl thryroid. Chest was hyperinflated by percussion with diminished breath sounds and moderate increased exp time without wheeze. Hoover sign positive at mid inspiration. Regular rate and rhythm without murmur gallop or rub or increase P2 or edema.  Abd: no hsm, nl excursion. Ext warm without cyanosis or clubbing.      CT chest 09/19/13 2.5 cm air superior segment left lower lobe pulmonary mass which   appears to have and endobronchial component and is likely the cause   for the patient's hemoptysis. This is consistent with a primary lung   neoplasm.   Associated subcarinal lymphadenopathy.   Severe emphysematous changes.           Assessment & Plan:        Imp: LLL mass assoc with new onset hemoptysis in pt with Pos PPD much more likely this is a tumor than TB so proceed with FOB asap and will include AFB studies  Discussed in detail all the  indications, usual  risks and alternatives  relative to the benefits with patient who agrees to proceed with bronchoscopy with biopsy.   .      09/21/2013 day  of FOB interim hx and exam: No change, still traces hemoptysis, no resting sob     Sandrea Hughs, MD Pulmonary and Critical Care Medicine [facility] Cell [phone] After 5:30 PM or weekends, call [phone]

## 2013-10-17 NOTE — Op Note (Signed)
Video Bronchoscopy with Endobronchial Ultrasound Procedure Note  Date of Operation: 10/17/2013  Pre-op Diagnosis: LLL nodule, mediastinal LAD  Post-op Diagnosis: Same  Surgeon: Levy Pupa  Assistants: none  Anesthesia: General endotracheal anesthesia  Operation: Flexible video fiberoptic bronchoscopy with endobronchial ultrasound and biopsies.  Estimated Blood Loss: Minimal  Complications: None Apparent  Indications and History: Cameron [name] is a 72 y.o. male with a hx of tobacco. He was evaluated for hemoptysis with CT scanning and subsequent PET scanning identifying a L superior segmental nodule and mediastinal LAD. Recommendation was made to pursue tissue bx using endobronchial ultrasound.  The risks, benefits, complications, treatment options and expected outcomes were discussed with the patient.  The possibilities of pneumothorax, pneumonia, reaction to medication, pulmonary aspiration, perforation of a viscus, bleeding, failure to diagnose a condition and creating a complication requiring transfusion or operation were discussed with the patient who freely signed the consent.    Description of Procedure: The patient was examined in the preoperative area and history and data from the preprocedure consultation were reviewed. It was deemed appropriate to proceed.  The patient was taken to [facility], identified as Cameron [name] and the procedure verified as Flexible Video Fiberoptic Bronchoscopy.  A Time Out was held and the above information confirmed. General anesthesia was initiated and the patient  was orally intubated. The video fiberoptic bronchoscope was introduced via the endotracheal tube and a general inspection was performed which showed normal airways. There were no endobronchial lesions seen. The standard scope was then withdrawn and the endobronchial ultrasound was used to identify and characterize the peritracheal, hilar and bronchial lymph nodes. Inspection showed  enlargement of 4L and 7. Using real-time ultrasound guidance Wang needle biopsies were take from Station 7 and 4L nodes and were sent for cytology. The patient tolerated the procedure well without apparent complications. There was no significant blood loss. The bronchoscope was withdrawn. Anesthesia was reversed and the patient was taken to the PACU for recovery.   Samples: 1. Wang needle biopsies from Station 7 node 2. Wang needle biopsies from Station 4L node  Plans:  The patient will be discharged from the PACU to home when recovered from anesthesia. We will review the cytology results with the patient when they become available. Outpatient followup will be with Dr Sherene Sires or Dr Delton Coombes.    Levy Pupa, MD, PhD 10/17/2013, 8:52 AM [facility] [phone] or if no answer [phone]

## 2013-10-17 NOTE — Transfer of Care (Signed)
Immediate Anesthesia Transfer of Care Note  Patient: Cameron [name]  Procedure(s) Performed: Procedure(s): ENDOBRONCHIAL ULTRASOUND (Bilateral)  Patient Location: PACU and Endoscopy Unit  Anesthesia Type:General  Level of Consciousness: awake and alert   Airway & Oxygen Therapy: Patient Spontanous Breathing and Patient connected to face mask oxygen  Post-op Assessment: Report given to PACU RN and Post -op Vital signs reviewed and stable  Post vital signs: Reviewed and stable  Complications: No apparent anesthesia complications

## 2013-10-17 NOTE — Interval H&P Note (Signed)
PCCM Interval Hx:   [name] underwent FOB on 11/19 for hemoptysis, LLL nodule, mediastinal LAD. No endobronchial lesion seen. Samples non-diagnostic. He has had no more hemoptysis. Feels at his baseline functional status.   Filed Vitals:   10/17/13 0649 10/17/13 0656  BP:  137/80  Pulse: 63   Temp: 97.6 F (36.4 C)   TempSrc: Oral   Resp: 15   Height: 5\' 8"  (1.727 m)   SpO2: 98%    Gen: Pleasant, well-nourished, elderly gentleman, in no distress,  normal affect  ENT: No lesions,  mouth clear,  oropharynx clear, no postnasal drip, M4 airway  Neck: No JVD, no TMG, no carotid bruits  Lungs: No use of accessory muscles, no rales or rhonchi, soft low-pitched end-exp wheeze B  Cardiovascular: RRR, heart sounds normal, no murmur or gallops, no peripheral edema  Abdomen: soft and NT, no HSM,  BS normal  Musculoskeletal: No deformities, no cyanosis or clubbing  Neuro: alert, non focal  Skin: Warm, some resolving erythema superior chest and lower neck   PET Scan 10/13/13 --  COMPARISON: CT 09/19/2013  FINDINGS:  NECK  No hypermetabolic lymph nodes in the neck.  CHEST  Hypermetabolic 2.5 cm nodule in the superior segment of left lower  lobe with SUV max 14.1. No additional hypermetabolic nodules. There  is a hypermetabolic subcarinal lymph node measuring 18 mm short axis  with SUV max 14.4. There is a hypermetabolic AP window node  measuring 8 mm (image 80) with SUV max 5.4. No hypermetabolic  supraclavicular adenopathy.  ABDOMEN/PELVIS  No abnormal hypermetabolic activity within the liver, pancreas,  adrenal glands, or spleen. No hypermetabolic lymph nodes in the  abdomen or pelvis.There is a low-density lesion superior aspect of  the left hepatic lobe measuring 15 mm (image 118) which does not  have associated metabolic activity.  SKELETON  No focal hypermetabolic activity to suggest skeletal metastasis.  IMPRESSION:  1. 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  2. Subcarinal and ipsilateral mediastinal nodal metastasis.  3. No distant metastasis. 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

## 2013-10-18 ENCOUNTER — Encounter (HOSPITAL_COMMUNITY): Payer: Self-pay | Admitting: Emergency Medicine

## 2013-10-19 ENCOUNTER — Ambulatory Visit (INDEPENDENT_AMBULATORY_CARE_PROVIDER_SITE_OTHER): Payer: Medicare Other | Admitting: Internal Medicine

## 2013-10-19 ENCOUNTER — Encounter: Payer: Self-pay | Admitting: Internal Medicine

## 2013-10-19 VITALS — BP 114/62 | HR 69 | Temp 97.5°F | Ht 68.0 in | Wt 142.0 lb

## 2013-10-19 DIAGNOSIS — R222 Localized swelling, mass and lump, trunk: Secondary | ICD-10-CM

## 2013-10-19 DIAGNOSIS — J449 Chronic obstructive pulmonary disease, unspecified: Secondary | ICD-10-CM

## 2013-10-19 DIAGNOSIS — R918 Other nonspecific abnormal finding of lung field: Secondary | ICD-10-CM

## 2013-10-19 DIAGNOSIS — J4489 Other specified chronic obstructive pulmonary disease: Secondary | ICD-10-CM

## 2013-10-19 NOTE — Patient Instructions (Addendum)
Please see patient coordinator before you leave today  to schedule evaluation at [facility]  Stop aspirin until no blood for 3 straight days then restart if tolerated

## 2013-10-19 NOTE — Assessment & Plan Note (Addendum)
PFTs 08/10/09  FEV1  1.43 (50%) ratio 37 but 16% better p B2 and [id number]  Adequate control on present rx, reviewed > no change in rx needed

## 2013-10-19 NOTE — Progress Notes (Signed)
  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   History of Present Illness  09/19/2013 1st [facility] office visit/ Derricka Mertz cc new onset variable hemoptysis x 2 weeks never more than two tsp and typically < a half and  less last few days prior to OV  rec - FOB 09/21/2013 > no significant mucosal abd, swelling subsegment of LLL >> neg bx/ atypical cells on BAL - PET 10/12/2013 Pos for subcarinal adenopathy  T1 N2 > referred to Dr Delton Coombes POS EBUS for [facility] L7 (subcarinal)    10/19/2013 f/u ov/Shamra Bradeen re: [facility] lung ca Chief Complaint  Patient presents with  . Follow-up    Pt states he is here to discuss his stage of cancer.  Pt c/o minor SOB with exertion, no other complaints.   has symbicort not using. No more hemoptysis   No obvious day to day or daytime variabilty or assoc new sob or cp or chest tightness, subjective wheeze overt sinus or hb symptoms. No unusual exp hx or h/o childhood pna/ asthma or knowledge of premature birth.  Sleeping ok without nocturnal  or early am exacerbation  of respiratory  c/o's or need for noct saba. Also denies any obvious fluctuation of symptoms with weather or environmental changes or other aggravating or alleviating factors except as outlined above   Current Medications, Allergies, Complete Past Medical History, Past Surgical History, Family History, and Social History were reviewed in [employer] record.  ROS  The following are not active complaints unless bolded sore throat, dysphagia, dental problems, itching, sneezing,  nasal congestion or excess/ purulent secretions, ear ache,   fever, chills, sweats, unintended wt loss, pleuritic or exertional cp, hemoptysis,  orthopnea pnd or leg swelling, presyncope,  palpitations, heartburn, abdominal pain, anorexia, nausea, vomiting, diarrhea  or change in bowel or urinary habits, change in stools or urine, dysuria,hematuria,  rash, arthralgias, visual complaints, headache, numbness weakness or ataxia or problems with walking or coordination,  change in mood/affect or memory.             Objective:   Physical Exam  amb wm nad  10/19/2013     142  Wt Readings from Last 3 Encounters:  09/19/13 143 lb (64.864 kg)  09/16/13 143 lb (64.864 kg)  09/12/13 146 lb (66.225 kg)      HEENT mild turbinate edema.  Oropharynx no thrush or excess pnd or cobblestoning.  No JVD or cervical adenopathy. Mild accessory muscle hypertrophy. Trachea midline, nl thryroid. Chest was hyperinflated by percussion with diminished breath sounds and moderate increased exp time without wheeze. Hoover sign positive at mid inspiration. Regular rate and rhythm without murmur gallop or rub or increase P2 or edema.  Abd: no hsm, nl excursion. Ext warm without cyanosis or clubbing.    CT chest 09/19/13 2.5 cm air superior segment left lower lobe pulmonary mass which  appears to have and endobronchial component and is likely the cause  for the patient's hemoptysis. This is consistent with a primary lung  neoplasm.  Associated subcarinal lymphadenopathy.  Severe emphysematous changes.       Assessment & Plan:

## 2013-10-19 NOTE — Assessment & Plan Note (Signed)
-   assoc with new onset hemoptysis 09/2013  - FOB 09/21/2013 > no significant mucosal abd, swelling subsegment of LLL >> neg bx/ atypical cells on BAL - PET 10/12/2013 Pos for subcarinal adenopathy  T1 N2 > referred to Dr Delton Coombes POS EBUS 10/17/2013 for NSCLCa 7 node  I had an extended discussion with the patient today lasting 15 to 20 minutes of a 25 minute visit on the following issues:  Not an operable lesion > MTOC next step

## 2013-10-20 ENCOUNTER — Telehealth: Payer: Self-pay | Admitting: *Deleted

## 2013-10-20 ENCOUNTER — Other Ambulatory Visit: Payer: Self-pay

## 2013-10-20 DIAGNOSIS — J984 Other disorders of lung: Secondary | ICD-10-CM

## 2013-10-20 NOTE — Telephone Encounter (Signed)
Called pt with appt to see Dr. Arbutus Ped 10/21/13 at 9:00 and labs at 8:45.  Pt verbalized understanding of time and place of appt

## 2013-10-20 NOTE — Telephone Encounter (Signed)
Wife called regarding pt appt.  She wanted to know if pt was seeing Dr. Arbutus Ped.  She was inquiring about the MTOC.  I stated today's mtoc is full and we will not have clinic for the next two weeks and pt needed to be seen soon.  She understood

## 2013-10-21 ENCOUNTER — Telehealth: Payer: Self-pay | Admitting: Internal Medicine

## 2013-10-21 ENCOUNTER — Encounter: Payer: Self-pay | Admitting: Internal Medicine

## 2013-10-21 ENCOUNTER — Ambulatory Visit (HOSPITAL_BASED_OUTPATIENT_CLINIC_OR_DEPARTMENT_OTHER): Payer: Medicare Other | Admitting: Internal Medicine

## 2013-10-21 ENCOUNTER — Ambulatory Visit: Payer: Medicare Other

## 2013-10-21 ENCOUNTER — Telehealth: Payer: Self-pay | Admitting: *Deleted

## 2013-10-21 ENCOUNTER — Other Ambulatory Visit (HOSPITAL_BASED_OUTPATIENT_CLINIC_OR_DEPARTMENT_OTHER): Payer: Medicare Other

## 2013-10-21 VITALS — BP 102/68 | HR 82 | Temp 96.5°F | Resp 18 | Ht 68.0 in | Wt 137.6 lb

## 2013-10-21 DIAGNOSIS — C3492 Malignant neoplasm of unspecified part of left bronchus or lung: Secondary | ICD-10-CM

## 2013-10-21 DIAGNOSIS — J984 Other disorders of lung: Secondary | ICD-10-CM

## 2013-10-21 DIAGNOSIS — C3490 Malignant neoplasm of unspecified part of unspecified bronchus or lung: Secondary | ICD-10-CM

## 2013-10-21 LAB — CBC WITH DIFFERENTIAL/PLATELET
BASO%: 0.4 % (ref 0.0–2.0)
Basophils Absolute: 0 10*3/uL (ref 0.0–0.1)
EOS%: 1.6 % (ref 0.0–7.0)
HCT: 41.6 % (ref 38.4–49.9)
HGB: 13.8 g/dL (ref 13.0–17.1)
MCH: 32.8 pg (ref 27.2–33.4)
MCHC: 33.2 g/dL (ref 32.0–36.0)
MONO%: 6 % (ref 0.0–14.0)
NEUT#: 9.5 10*3/uL — ABNORMAL HIGH (ref 1.5–6.5)
NEUT%: 84 % — ABNORMAL HIGH (ref 39.0–75.0)
RBC: 4.21 10*6/uL (ref 4.20–5.82)
RDW: 13.7 % (ref 11.0–14.6)
lymph#: 0.9 10*3/uL (ref 0.9–3.3)
nRBC: 0 % (ref 0–0)

## 2013-10-21 LAB — COMPREHENSIVE METABOLIC PANEL (CC13)
ALT: 17 U/L (ref 0–55)
AST: 21 U/L (ref 5–34)
Albumin: 2.7 g/dL — ABNORMAL LOW (ref 3.5–5.0)
Alkaline Phosphatase: 52 U/L (ref 40–150)
Glucose: 124 mg/dl (ref 70–140)
Potassium: 4.1 mEq/L (ref 3.5–5.1)
Sodium: 141 mEq/L (ref 136–145)
Total Bilirubin: 0.55 mg/dL (ref 0.20–1.20)
Total Protein: 5.7 g/dL — ABNORMAL LOW (ref 6.4–8.3)

## 2013-10-21 MED ORDER — PROCHLORPERAZINE MALEATE 10 MG PO TABS: 10.0000 mg | ORAL_TABLET | Freq: Four times a day (QID) | ORAL | Status: AC | PRN

## 2013-10-21 NOTE — Progress Notes (Signed)
[name] [phone]   [phone]  CONSULT NOTE  REFERRING PHYSICIAN: Dr. Sandrea Hughs  REASON FOR CONSULTATION:  72 years old white male recently diagnosed with lung cancer.  HPI Cameron [name] is a 72 y.o. male with a past medical history significant for multiple medical problems including history of coronary artery disease status post CABG in 2006, COPD, dyslipidemia, osteoarthritis, GERD, low back pain and long history of smoking but quit in 2005. The patient mentions that he start having increasing chest pain as well as weakness and cough productive of blood-tinged sputum in early November of 2014. Chest x-ray at that time showed bronchitic changes and hyperinflation but no focal pulmonary abnormality. The patient was treated with a course of antibiotics and inhalers but no improvement in his condition. CT scan of the chest performed on 09/19/2013 showed 2.5 cm air superior segment left lower lobe pulmonary mass which appears to have and endobronchial component and is likely the cause for the patient's hemoptysis. This is consistent with a primary lung neoplasm. Associated subcarinal lymphadenopathy. This was followed by a PET scan on 10/13/2013 which showed hypermetabolic left lower lobe pulmonary nodule consistent with bronchogenic carcinoma. There was subcarinal and ipsilateral mediastinal nodal metastasis. There was no distant metastasis. On 10/17/2013 the patient underwent a video bronchoscopy with endobronchial ultrasound under the care of Dr. Delton Coombes. The final pathology (Accession: [id number]) showed poorly differentiated non-small cell carcinoma. The tumor cells are positive with cytokeratin 7 and cytokeratin AE1/AE3 and are negative with cytokeratin 5/6, p63, thyroid transcription factor - 1 (TTF-1) and S100. The immunohistochemistry results are nonspecific. Dr. Sherene Sires kindly referred the patient to me today for further evaluation and  recommendation regarding treatment of his condition. When seen today the patient is feeling fine with no specific complaints except for the shortness breath with exertion and cough productive of clear sputum. The patient denied having any significant chest pain or home of this is. He denied having any significant weight loss or night sweats. He has no headache or visual changes. Family history significant for heart disease in both mother and father. The patient is married and has 4 children. He was accompanied by his wife [name] and his son [name]. The patient used to work in maintenance at [employer]. He has a history of smoking one pack per day for around 40 years and quit in 2005. He has no history of alcohol or drug abuse.   HPI  Past Medical History  Diagnosis Date  . COPD (chronic obstructive pulmonary disease)   . Asthma   . Coronary artery disease     s/p stenting to the right coronary artery with subsequent CABG.  He has LIMA to the LAD,SVG to diagonal, SVG to obtuse marginal, and SVG to PDA,  . Hyperlipidemia   . Osteoarthritis   . GERD (gastroesophageal reflux disease)   . Low back pain   . Lung nodule   . Hypertension   . History of kidney stones     x1  . 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 Appendectomy    . Hemorrhoid surgery    . Video bronchoscopy Bilateral 09/21/2013    Procedure: VIDEO BRONCHOSCOPY WITHOUT FLUORO;  Surgeon: Nyoka Cowden, MD;  Location: [facility];  Service: Cardiopulmonary;  Laterality: Bilateral;  . Coronary artery bypass graft      ,stent place prior-no problems now  .  Endobronchial ultrasound Bilateral 10/17/2013    Procedure: ENDOBRONCHIAL ULTRASOUND;  Surgeon: Leslye Peer, MD;  Location: [facility];  Service: Cardiopulmonary;  Laterality: Bilateral;    Family History  Problem Relation Age of Onset  . Heart attack Mother   . Diabetes Mother   . Heart  attack Father   . Heart attack Brother   . Hypertension Other   . Colon cancer Neg Hx   . Emphysema Brother     smoked    Social History History  Substance Use Topics  . Smoking status: Former Smoker -- 1.00 packs/day for 40 years    Types: Cigarettes    Quit date: 11/04/2003  . Smokeless tobacco: Never Used  . Alcohol Use: No    Allergies  Allergen Reactions  . Pravastatin     CK>2000, muscle weakness  . Rosuvastatin Other (See Comments)    REACTION:muscle aches  . Zolpidem Other (See Comments)    confusion  . Augmentin [Amoxicillin-Pot Clavulanate] Rash    Current Outpatient Prescriptions  Medication Sig Dispense Refill  . amLODipine (NORVASC) 2.5 MG tablet Take 2.5 mg by mouth at bedtime.      Marland Kitchen aspirin 81 MG tablet Take 81 mg by mouth daily.      . budesonide-formoterol (SYMBICORT) 160-4.5 MCG/ACT inhaler Inhale 2 puffs into the lungs 2 (two) times daily.      . clonazePAM (KLONOPIN) 0.5 MG tablet Take 0.5 mg by mouth at bedtime. As needed for sleep      . diphenhydrAMINE (BENADRYL) 25 MG tablet Take 25 mg by mouth every 6 (six) hours as needed for allergies.       Marland Kitchen losartan (COZAAR) 50 MG tablet Take 50 mg by mouth 2 (two) times daily.      . metoprolol succinate (TOPROL-XL) 25 MG 24 hr tablet Take 37.5 mg by mouth every morning. Takes 1 and 1/2      . nitroGLYCERIN (NITROSTAT) 0.4 MG SL tablet Place 0.4 mg under the tongue every 5 (five) minutes as needed. x3 doses as needed for chest pain      . omeprazole (PRILOSEC) 20 MG capsule Take 20 mg by mouth daily.      . predniSONE (DELTASONE) 10 MG tablet Take 10 mg by mouth daily with breakfast.      . Tamsulosin HCl (FLOMAX) 0.4 MG CAPS Take 0.4 mg by mouth daily.       . traMADol (ULTRAM) 50 MG tablet Take 50 mg by mouth every 6 (six) hours as needed for moderate pain.      Marland Kitchen prochlorperazine (COMPAZINE) 10 MG tablet Take 1 tablet (10 mg total) by mouth every 6 (six) hours as needed for nausea or vomiting.  60 tablet   0   No current facility-administered medications for this visit.    Review of Systems  Constitutional: negative Eyes: negative Ears, nose, mouth, throat, and face: negative Respiratory: positive for cough, dyspnea on exertion and sputum Cardiovascular: negative Gastrointestinal: negative Genitourinary:negative Integument/breast: negative Hematologic/lymphatic: negative Musculoskeletal:negative Neurological: negative Behavioral/Psych: negative Endocrine: negative Allergic/Immunologic: negative  Physical Exam  EXB:MWUXL, healthy, no distress, well nourished and well developed SKIN: skin color, texture, turgor are normal, no rashes or significant lesions HEAD: Normocephalic, No masses, lesions, tenderness or abnormalities EYES: normal, PERRLA EARS: External ears normal, Canals clear OROPHARYNX:no exudate, no erythema and lips, buccal mucosa, and tongue normal  NECK: supple, no adenopathy, no JVD LYMPH:  no palpable lymphadenopathy, no hepatosplenomegaly LUNGS: clear to auscultation , and palpation HEART: regular  rate & rhythm, no murmurs and no gallops ABDOMEN:abdomen soft, non-tender, normal bowel sounds and no masses or organomegaly BACK: Back symmetric, no curvature., No CVA tenderness EXTREMITIES:no joint deformities, effusion, or inflammation, no edema, no skin discoloration  NEURO: alert & oriented x 3 with fluent speech, no focal motor/sensory deficits  PERFORMANCE STATUS: ECOG 1  LABORATORY DATA: Lab Results  Component Value Date   WBC 11.3* 10/21/2013   HGB 13.8 10/21/2013   HCT 41.6 10/21/2013   MCV 98.8* 10/21/2013   PLT 248 10/21/2013      Chemistry      Component Value Date/Time   NA 141 10/21/2013 0839   NA 134* 09/27/2013 1733   K 4.1 10/21/2013 0839   K 4.6 09/27/2013 1733   CL 101 09/27/2013 1733   CO2 28 10/21/2013 0839   CO2 31 09/27/2013 1733   BUN 18.7 10/21/2013 0839   BUN 24* 09/27/2013 1733   CREATININE 1.2 10/21/2013 0839    CREATININE 1.3 09/27/2013 1733      Component Value Date/Time   CALCIUM 9.0 10/21/2013 0839   CALCIUM 8.7 09/27/2013 1733   ALKPHOS 52 10/21/2013 0839   ALKPHOS 52 09/27/2013 1733   AST 21 10/21/2013 0839   AST 230* 09/27/2013 1733   ALT 17 10/21/2013 0839   ALT 96* 09/27/2013 1733   BILITOT 0.55 10/21/2013 0839   BILITOT 0.7 09/27/2013 1733       RADIOGRAPHIC STUDIES: Nm Pet Image Initial (pi) Skull Base To Thigh  10/12/2013   CLINICAL DATA:  Initial treatment strategy for left lower lobe pulmonary nodule. Hemoptysis  EXAM: NUCLEAR MEDICINE PET SKULL BASE TO THIGH  FASTING BLOOD GLUCOSE:  Value: 114mg /dl  TECHNIQUE: 09.8 mCi J-19 FDG was injected intravenously. CT data was obtained and used for attenuation correction and anatomic localization only. (This was not acquired as a diagnostic CT examination.) Additional exam technical data entered on technologist worksheet.  COMPARISON:  CT 09/19/2013  FINDINGS: NECK  No hypermetabolic lymph nodes in the neck.  CHEST  Hypermetabolic 2.5 cm nodule in the superior segment of left lower lobe with SUV max 14.1. No additional hypermetabolic nodules. There is a hypermetabolic subcarinal lymph node measuring 18 mm short axis with SUV max 14.4. There is a hypermetabolic AP window node measuring 8 mm (image 80) with SUV max 5.4. No hypermetabolic supraclavicular adenopathy.  ABDOMEN/PELVIS  No abnormal hypermetabolic activity within the liver, pancreas, adrenal glands, or spleen. No hypermetabolic lymph nodes in the abdomen or pelvis.There is a low-density lesion superior aspect of the left hepatic lobe measuring 15 mm (image 118) which does not have associated metabolic activity.  SKELETON  No focal hypermetabolic activity to suggest skeletal metastasis.  IMPRESSION: 1. Hypermetabolic left lower lobe pulmonary nodule consists with bronchogenic carcinoma. 2. Subcarinal and ipsilateral mediastinal nodal metastasis. 3. No distant metastasis.   Staging by FDG PET  imaging T1b N2 M0   Electronically Signed   By: Genevive Bi M.D.   On: 10/12/2013 11:23    ASSESSMENT: This is a very pleasant 72 years old white male recently diagnosed with a stage IIIA (T1b, N2, M0) poorly differentiated non-small cell lung cancer, presented with left lower lobe pulmonary nodule and ipsilateral as well as subcarinal mediastinal lymphadenopathy diagnosed in December of 2014. The subtype was not identified secondary to a nonspecific immunostains.    PLAN: I had a lengthy discussion with the patient and his family about his current disease stage, prognosis and treatment options. I will complete the  staging workup by ordering MRI of the brain to rule out brain metastasis. I recommended for the patient treatment with a course of concurrent chemoradiation with weekly carboplatin for AUC of 2 and paclitaxel 45 mg/M2 for a total of 6-7 weeks according to the final dose of radiation. I discussed with the patient adverse effect of the chemotherapy including but not limited to alopecia, myelosuppression, nausea and vomiting, peripheral neuropathy, liver or renal dysfunction. The patient is referred to Dr. Kathrynn Running for evaluation and discussion of the radiotherapy option. I will arrange for the patient to have a chemotherapy education class before starting the first cycle of his chemotherapy. I will call his pharmacy with prescription for Compazine 10 mg by mouth every 6 hours as needed for nausea. I expect the patient to start the first cycle of his concurrent chemoradiation on the week of 10/31/2013. He would come back for follow up visit in 3 weeks for reevaluation and management any adverse effect of his chemotherapy. I gave the patient and his family the time to ask questions and I answered them completely to their satisfaction. The patient was advised to call immediately if he has any concerning symptoms in the interval.  The patient voices understanding of current disease status  and treatment options and is in agreement with the current care plan.  All questions were answered. The patient knows to call the clinic with any problems, questions or concerns. We can certainly see the patient much sooner if necessary.  Thank you so much for allowing me to participate in the care of Cameron [name]. I will continue to follow up the patient with you and assist in his care.  I spent 55 minutes counseling the patient face to face. The total time spent in the appointment was 75 minutes.  Deone Leifheit K. 10/21/2013, 10:04 AM

## 2013-10-21 NOTE — Telephone Encounter (Signed)
Gave pt appt for lab, md,MRI and chemo class emailed Marcelino Duster regarding chemo

## 2013-10-21 NOTE — Telephone Encounter (Signed)
Per staff message and POF I have scheduled appts. I have tried to schedule treament on 12/29, no available until 1/2. Scheduler notified aboaut weeek on 12/29 and that lab appt need to move   JMW

## 2013-10-21 NOTE — Telephone Encounter (Signed)
miskae 

## 2013-10-22 NOTE — Patient Instructions (Signed)
You are recently diagnosed with stage IIIA non-small cell lung cancer. We discussed treatment options including a course of concurrent chemoradiation. Follow up visit in 3 weeks

## 2013-10-24 ENCOUNTER — Ambulatory Visit (INDEPENDENT_AMBULATORY_CARE_PROVIDER_SITE_OTHER): Payer: Medicare Other | Admitting: Internal Medicine

## 2013-10-24 ENCOUNTER — Encounter: Payer: Self-pay | Admitting: Internal Medicine

## 2013-10-24 ENCOUNTER — Telehealth: Payer: Self-pay | Admitting: *Deleted

## 2013-10-24 ENCOUNTER — Telehealth: Payer: Self-pay | Admitting: Internal Medicine

## 2013-10-24 ENCOUNTER — Ambulatory Visit (INDEPENDENT_AMBULATORY_CARE_PROVIDER_SITE_OTHER)
Admission: RE | Admit: 2013-10-24 | Discharge: 2013-10-24 | Disposition: A | Discharge status: Home / Self Care | Payer: Medicare Other | Source: Ambulatory Visit | Attending: Internal Medicine | Admitting: Internal Medicine

## 2013-10-24 ENCOUNTER — Other Ambulatory Visit: Payer: Self-pay | Admitting: *Deleted

## 2013-10-24 DIAGNOSIS — C3492 Malignant neoplasm of unspecified part of left bronchus or lung: Secondary | ICD-10-CM

## 2013-10-24 DIAGNOSIS — J449 Chronic obstructive pulmonary disease, unspecified: Secondary | ICD-10-CM

## 2013-10-24 DIAGNOSIS — C3490 Malignant neoplasm of unspecified part of unspecified bronchus or lung: Secondary | ICD-10-CM

## 2013-10-24 DIAGNOSIS — R05 Cough: Secondary | ICD-10-CM

## 2013-10-24 MED ORDER — ALBUTEROL SULFATE HFA 108 (90 BASE) MCG/ACT IN AERS: 2.0000 | INHALATION_SPRAY | Freq: Four times a day (QID) | RESPIRATORY_TRACT | Status: AC | PRN

## 2013-10-24 MED ORDER — PROMETHAZINE-CODEINE 6.25-10 MG/5ML PO SYRP: 5.0000 mL | ORAL_SOLUTION | ORAL | Status: DC | PRN

## 2013-10-24 MED ORDER — AZITHROMYCIN 250 MG PO TABS: ORAL_TABLET | ORAL | Status: DC

## 2013-10-24 NOTE — Progress Notes (Signed)
Pre-visit discussion using our clinic review tool. No additional management support is needed unless otherwise documented below in the visit note.  

## 2013-10-24 NOTE — Patient Instructions (Signed)
It was good to see you today.  Test(s) ordered today. Your results will be released to MyChart (or called to you) after review, usually within 72hours after test completion. If any changes need to be made, you will be notified at that same time.  Zpak antibiotics, rescue inhaler as needed and prescription cough syrup - Your prescription(s) have been submitted to your pharmacy. Please take as directed and contact our office if you believe you are having problem(s) with the medication(s).  Alternate between ibuprofen and tylenol for aches, pain and fever symptoms as discussed  Hydrate, rest and call if worse or unimproved

## 2013-10-24 NOTE — Telephone Encounter (Signed)
Called pt and left message regarding lab ,md and chemo for December and january 2015

## 2013-10-24 NOTE — Telephone Encounter (Signed)
Pt wife called left me a vm message having questions about genetic testing.  I called back left vm message to call me back with phone number

## 2013-10-24 NOTE — Progress Notes (Signed)
Subjective:    Patient ID: Cameron [name], male    DOB: [birth year], 72 y.o.   MRN: [id number]  Cough This is a chronic problem. The current episode started 1 to 4 weeks ago (worse after bronch and bx). The problem has been unchanged. The problem occurs every few minutes. The cough is non-productive. Associated symptoms include ear congestion (left), a fever (low grade), nasal congestion, shortness of breath and wheezing. Pertinent negatives include no chest pain, headaches, hemoptysis, myalgias, postnasal drip, rash, rhinorrhea or sore throat. The symptoms are aggravated by lying down. Risk factors for lung disease include smoking/tobacco exposure. He has tried steroid inhaler, a beta-agonist inhaler and oral steroids for the symptoms. The treatment provided mild relief. His past medical history is significant for COPD and environmental allergies. new dx NSCLC 10/17/13 by VATS    Past Medical History  Diagnosis Date  . COPD (chronic obstructive pulmonary disease)   . Asthma   . Coronary artery disease     s/p stenting to the right coronary artery with subsequent CABG.  He has LIMA to the LAD,SVG to diagonal, SVG to obtuse marginal, and SVG to PDA,  . Hyperlipidemia   . Osteoarthritis   . GERD (gastroesophageal reflux disease)   . Low back pain   . Lung nodule   . Hypertension   . History of kidney stones     x1  . 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  HENT: Negative for postnasal drip, rhinorrhea and sore throat.   Respiratory: Positive for cough, shortness of breath and wheezing. Negative for hemoptysis.   Cardiovascular: Negative for chest pain.  Musculoskeletal: Negative for myalgias.  Skin: Negative for rash.  Allergic/Immunologic: Positive for environmental allergies.  Neurological: Negative for headaches.       Objective:   Physical Exam There were no vitals taken for this  visit. Wt Readings from Last 3 Encounters:  10/21/13 137 lb 9.6 oz (62.415 kg)  10/19/13 142 lb (64.411 kg)  09/27/13 142 lb (64.411 kg)   Constitutional: he appears well-developed and well-nourished. No distress. wife at side Neck: Normal range of motion. Neck supple. No JVD or LAD present. No thyromegaly present.  Cardiovascular: Normal rate, regular rhythm and normal heart sounds.  No murmur heard. No BLE edema. Pulmonary/Chest: Effort normal at rest, but breath sounds diminished with diffuse exp wheeze bilaterally. he has no crackles  Skin: Skin is warm and dry. No rash noted. No erythema.  Psychiatric: he has a normal mood and affect. behavior is normal. Judgment and thought content normal.   Lab Results  Component Value Date   WBC 11.3* 10/21/2013   HGB 13.8 10/21/2013   HCT 41.6 10/21/2013   PLT 248 10/21/2013   GLUCOSE 124 10/21/2013   CHOL 141 03/22/2012   TRIG 87.0 03/22/2012   HDL 43.80 03/22/2012   LDLCALC 80 03/22/2012   ALT 17 10/21/2013   AST 21 10/21/2013   NA 141 10/21/2013   K 4.1 10/21/2013   CL 101 09/27/2013   CREATININE 1.2 10/21/2013   BUN 18.7 10/21/2013   CO2 28 10/21/2013   TSH 2.04 09/12/2013   PSA 1.45 02/19/2007   INR 0.96 01/29/2013      Assessment & Plan:   Cough -dry, but associated with low-grade fever and worse at night  New diagnosis non-small cell lung cancer LLL with multiple pulmonary interventions including diagnostic bronch and VATS  in past weeks  COPD, on Symbicort twice daily  ---- Check plain x-ray now to rule out new airspace disease Begin empiric antibiotics with azithromycin Add rescue albuterol prn to scheduled Symbicort No increase in prednisone recommended at this time, but consider burst dose if needed Codeine cough syrup to 8 nocturnal symptoms and provide rest

## 2013-10-25 ENCOUNTER — Ambulatory Visit
Admission: RE | Admit: 2013-10-25 | Discharge: 2013-10-25 | Disposition: A | Discharge status: Home / Self Care | Payer: Medicare Other | Source: Ambulatory Visit | Attending: Internal Medicine | Admitting: Internal Medicine

## 2013-10-25 ENCOUNTER — Other Ambulatory Visit: Payer: Medicare Other

## 2013-10-25 ENCOUNTER — Telehealth: Payer: Self-pay | Admitting: *Deleted

## 2013-10-25 ENCOUNTER — Encounter: Payer: Self-pay | Admitting: *Deleted

## 2013-10-25 DIAGNOSIS — C3492 Malignant neoplasm of unspecified part of left bronchus or lung: Secondary | ICD-10-CM

## 2013-10-25 MED ORDER — GADOBENATE DIMEGLUMINE 529 MG/ML IV SOLN
13.0000 mL | Freq: Once | INTRAVENOUS | Status: AC | PRN
  Administered 2013-10-25: 13 mL via INTRAVENOUS

## 2013-10-25 NOTE — Telephone Encounter (Signed)
Called and spoke with wife.  She is here at [facility].  She asked about genetic mutation results.  I listened to her questions and answered.  I explained what the test was and what it was looking for.  She verbalized understanding

## 2013-10-25 NOTE — Progress Notes (Signed)
Called pathology dept they will send tissue for EGFR/ALK

## 2013-10-26 ENCOUNTER — Telehealth: Payer: Self-pay | Admitting: Internal Medicine

## 2013-10-26 ENCOUNTER — Encounter: Payer: Self-pay | Admitting: Radiation Oncology

## 2013-10-26 NOTE — Progress Notes (Signed)
Thoracic Location of Tumor / Histology: left lower lobe pulmonary mass with subcarinal and ipsilateral mediastinal nodal metastasis  Patient presented early November 2014 with symptoms of: increasing chest pain, weakness, and cough productive of blood tinged sputum.  Video bronchoscopy (if applicable) revealed:     Tobacco/Marijuana/Snuff/ETOH use: smoked one pack per day for 40 years quit in 2005  Past/Anticipated interventions by cardiothoracic surgery, if any: endoscopy  Past/Anticipated interventions by medical oncology, if any: I will complete the staging workup by ordering MRI of the brain to rule out brain metastasis.  I recommended for the patient treatment with a course of concurrent chemoradiation with weekly carboplatin for AUC of 2 and paclitaxel 45 mg/M2 for a total of 6-7 weeks according to the final dose of radiation.  I discussed with the patient adverse effect of the chemotherapy including but not limited to alopecia, myelosuppression, nausea and vomiting, peripheral neuropathy, liver or renal dysfunction.  The patient is referred to Dr. Kathrynn Running for evaluation and discussion of the radiotherapy option.  I will arrange for the patient to have a chemotherapy education class before starting the first cycle of his chemotherapy.  I will call his pharmacy with prescription for Compazine 10 mg by mouth every 6 hours as needed for nausea.  I expect the patient to start the first cycle of his concurrent chemoradiation on the week of 10/31/2013.   Signs/Symptoms  Weight changes, if any: Denies  Respiratory complaints, if any: shortness of breath with exertion and a productive cough  Hemoptysis, if any: initially but, reports clear sputum now  Pain issues, if any:  Chest pain  SAFETY ISSUES:  Prior radiation? NO  Pacemaker/ICD? NO  Possible current pregnancy? NO  Is the patient on methotrexate? NO  Current Complaints / other details:  72 year old male. Married to  Dailey. Has 4 children. Works maintenance for [employer].   MRI results: FINDINGS:  Two punctate foci of susceptibility artifact are present in the  white matter of the posterior right frontal and parietal lobes and  may reflect remote microhemorrhage. There are multiple small foci of  T2 hyperintensity within the subcortical and deep cerebral white  matter and pons, nonspecific but compatible with mild chronic small  vessel ischemic disease. Ventricles and sulci are within normal  limits for age. There is no evidence of acute infarct, mass, midline  shift, or extra-axial collection. There is no abnormal enhancement.  Orbits and visualized paranasal sinuses and mastoid air cells are  unremarkable. Major intracranial vascular flow voids are  unremarkable.  IMPRESSION:  No evidence of intracranial metastatic disease.  Electronically Signed  By: Sebastian Ache  On: 10/25/2013 14:36

## 2013-10-26 NOTE — Telephone Encounter (Signed)
Talked to pt and he already have appt calendar for december and January 2015

## 2013-10-28 ENCOUNTER — Ambulatory Visit
Admission: RE | Admit: 2013-10-28 | Discharge: 2013-10-28 | Disposition: A | Discharge status: Home / Self Care | Payer: Medicare Other | Source: Ambulatory Visit | Attending: Radiation Oncology | Admitting: Radiation Oncology

## 2013-10-28 ENCOUNTER — Encounter: Payer: Self-pay | Admitting: Radiation Oncology

## 2013-10-28 VITALS — BP 117/72 | HR 64 | Temp 98.0°F | Resp 16 | Ht 68.0 in | Wt 137.1 lb

## 2013-10-28 DIAGNOSIS — Z951 Presence of aortocoronary bypass graft: Secondary | ICD-10-CM | POA: Insufficient documentation

## 2013-10-28 DIAGNOSIS — K219 Gastro-esophageal reflux disease without esophagitis: Secondary | ICD-10-CM | POA: Insufficient documentation

## 2013-10-28 DIAGNOSIS — C3490 Malignant neoplasm of unspecified part of unspecified bronchus or lung: Secondary | ICD-10-CM | POA: Insufficient documentation

## 2013-10-28 DIAGNOSIS — Z79899 Other long term (current) drug therapy: Secondary | ICD-10-CM | POA: Insufficient documentation

## 2013-10-28 DIAGNOSIS — C3492 Malignant neoplasm of unspecified part of left bronchus or lung: Secondary | ICD-10-CM

## 2013-10-28 DIAGNOSIS — R059 Cough, unspecified: Secondary | ICD-10-CM | POA: Insufficient documentation

## 2013-10-28 DIAGNOSIS — Z87442 Personal history of urinary calculi: Secondary | ICD-10-CM | POA: Insufficient documentation

## 2013-10-28 DIAGNOSIS — Z510 Encounter for antineoplastic radiation therapy: Secondary | ICD-10-CM | POA: Insufficient documentation

## 2013-10-28 DIAGNOSIS — J449 Chronic obstructive pulmonary disease, unspecified: Secondary | ICD-10-CM | POA: Insufficient documentation

## 2013-10-28 DIAGNOSIS — Z87891 Personal history of nicotine dependence: Secondary | ICD-10-CM | POA: Insufficient documentation

## 2013-10-28 DIAGNOSIS — J4489 Other specified chronic obstructive pulmonary disease: Secondary | ICD-10-CM | POA: Insufficient documentation

## 2013-10-28 DIAGNOSIS — R0602 Shortness of breath: Secondary | ICD-10-CM | POA: Insufficient documentation

## 2013-10-28 DIAGNOSIS — I10 Essential (primary) hypertension: Secondary | ICD-10-CM | POA: Insufficient documentation

## 2013-10-28 DIAGNOSIS — I2510 Atherosclerotic heart disease of native coronary artery without angina pectoris: Secondary | ICD-10-CM | POA: Insufficient documentation

## 2013-10-28 DIAGNOSIS — C778 Secondary and unspecified malignant neoplasm of lymph nodes of multiple regions: Secondary | ICD-10-CM | POA: Insufficient documentation

## 2013-10-28 DIAGNOSIS — R05 Cough: Secondary | ICD-10-CM | POA: Insufficient documentation

## 2013-10-28 DIAGNOSIS — E785 Hyperlipidemia, unspecified: Secondary | ICD-10-CM | POA: Insufficient documentation

## 2013-10-28 DIAGNOSIS — R599 Enlarged lymph nodes, unspecified: Secondary | ICD-10-CM | POA: Insufficient documentation

## 2013-10-28 DIAGNOSIS — R590 Localized enlarged lymph nodes: Secondary | ICD-10-CM

## 2013-10-28 NOTE — Progress Notes (Signed)
See progress noted under physician encounter.  

## 2013-10-28 NOTE — Assessment & Plan Note (Signed)
Plan for concurrent chemo and radiation therapy

## 2013-10-28 NOTE — Progress Notes (Signed)
Complete PATIENT MEASURE OF DISTRESS worksheet with a score of 3 submitted to social work.  

## 2013-10-28 NOTE — Progress Notes (Signed)
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 male who started having increasing chest pain, weakness and cough productive of blood-tinged sputum in early November of 2014. Chest x-ray at that time showed bronchitic changes and hyperinflation but no focal pulmonary abnormality. The patient was treated with a course of antibiotics and inhalers but no improvement in his condition. Consequently, CT scan of the chest performed on 09/19/2013 showed 2.5 cm air superior segment left lower lobe pulmonary mass which appears to have and endobronchial component and is likely the cause for the patient's hemoptysis. This is consistent with a primary lung neoplasm. There was also subcarinal lymphadenopathy.  This was followed by a PET scan on 10/13/2013 which showed hypermetabolic left lower lobe pulmonary nodule consistent with bronchogenic carcinoma. There was subcarinal and ipsilateral mediastinal nodal metastasis. There was no distant metastasis.  On 10/17/2013 the patient underwent a video bronchoscopy with endobronchial ultrasound under the care of Dr. Delton Coombes. The final pathology (Accession: [id number]) showed poorly differentiated non-small cell carcinoma. The tumor cells are positive with cytokeratin 7 and cytokeratin AE1/AE3 and are negative with cytokeratin 5/6, p63, thyroid transcription factor - 1 (TTF-1) and S100. The immunohistochemistry results are nonspecific.  Brain MRI was performed on 10/25/2013 and showed no brain metastases. The patient was seen in consultation by Dr. Arbutus Ped on December 19 and he has kindly  been referred today to discuss possible radiation treatment options.  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    PAST SURGICAL HISTORY: Past Surgical History  Procedure Laterality Date  . Appendectomy    . Hemorrhoid surgery    . Video bronchoscopy Bilateral 09/21/2013    Procedure: VIDEO BRONCHOSCOPY WITHOUT FLUORO;  Surgeon: Nyoka Cowden, MD;  Location: [facility];  Service: Cardiopulmonary;  Laterality: Bilateral;  . Coronary artery bypass graft      ,stent place prior-no problems now  . Endobronchial ultrasound Bilateral 10/17/2013    Procedure: ENDOBRONCHIAL ULTRASOUND;  Surgeon: Leslye Peer, MD;  Location: [facility];  Service: Cardiopulmonary;  Laterality: Bilateral;    FAMILY HISTORY: family history includes Diabetes in his mother; Emphysema in his brother; Heart attack in his brother, father, and mother; Hypertension in his other. There is no history of Colon cancer.  SOCIAL HISTORY:  reports that he quit smoking about 9 years ago. His smoking use included Cigarettes. He has a 40 pack-year smoking history. He has never used smokeless tobacco. He reports that he does not drink alcohol or use illicit drugs.  ALLERGIES: Pravastatin; Rosuvastatin; Zolpidem; and Augmentin  MEDICATIONS:  Current Outpatient Prescriptions  Medication Sig Dispense Refill  . albuterol (PROVENTIL HFA;VENTOLIN HFA) 108 (90 BASE) MCG/ACT inhaler Inhale 2 puffs into the lungs every 6 (six) hours as needed for wheezing or shortness of breath.  1 Inhaler  0  . amLODipine (NORVASC) 2.5 MG tablet Take 2.5  mg by mouth at bedtime.      Marland Kitchen aspirin 81 MG  tablet Take 81 mg by mouth daily.      Marland Kitchen azithromycin (ZITHROMAX Z-PAK) 250 MG tablet Take 2 tablets (500 mg) on  Day 1,  followed by 1 tablet (250 mg) once daily on Days 2 through 5.  6 each  0  . budesonide-formoterol (SYMBICORT) 160-4.5 MCG/ACT inhaler Inhale 2 puffs into the lungs 2 (two) times daily.      . Cholecalciferol (VITAMIN D-3) 1000 UNITS CAPS Take by mouth daily.      . clonazePAM (KLONOPIN) 0.5 MG tablet Take 0.5 mg by mouth at bedtime. As needed for sleep      . diphenhydrAMINE (BENADRYL) 25 MG tablet Take 25 mg by mouth every 6 (six) hours as needed for allergies.       Marland Kitchen losartan (COZAAR) 50 MG tablet Take 50 mg by mouth 2 (two) times daily.      . metoprolol succinate (TOPROL-XL) 25 MG 24 hr tablet Take 37.5 mg by mouth every morning. Takes 1 and 1/2      . nitroGLYCERIN (NITROSTAT) 0.4 MG SL tablet Place 0.4 mg under the tongue every 5 (five) minutes as needed. x3 doses as needed for chest pain      . omeprazole (PRILOSEC) 20 MG capsule Take 20 mg by mouth daily.      . predniSONE (DELTASONE) 10 MG tablet Take 10 mg by mouth daily with breakfast.      . prochlorperazine (COMPAZINE) 10 MG tablet Take 1 tablet (10 mg total) by mouth every 6 (six) hours as needed for nausea or vomiting.  60 tablet  0  . promethazine-codeine (PHENERGAN WITH CODEINE) 6.25-10 MG/5ML syrup Take 5 mLs by mouth every 4 (four) hours as needed for cough.  180 mL  0  . Tamsulosin HCl (FLOMAX) 0.4 MG CAPS Take 0.4 mg by mouth daily.       . traMADol (ULTRAM) 50 MG tablet Take 50 mg by mouth every 6 (six) hours as needed for moderate pain.       No current facility-administered medications for this encounter.    REVIEW OF SYSTEMS:  A 15 point review of systems is documented in the electronic medical record. This was obtained by the nursing staff. However, I reviewed this with the patient to discuss relevant findings and make appropriate changes.  Pertinent items are noted in HPI.   PHYSICAL EXAM:  vitals  were not taken for this visit.  per med onc   ZOX:WRUEA, healthy, no distress, well nourished and well developed  SKIN: skin color, texture, turgor are normal, no rashes or significant lesions  HEAD: Normocephalic, No masses, lesions, tenderness or abnormalities  EYES: normal, PERRLA  EARS: External ears normal, Canals clear  OROPHARYNX:no exudate, no erythema and lips, buccal mucosa, and tongue normal  NECK: supple, no adenopathy, no JVD  LYMPH: no palpable lymphadenopathy, no hepatosplenomegaly  LUNGS: clear to auscultation , and palpation  HEART: regular rate & rhythm, no murmurs and no gallops  ABDOMEN:abdomen soft, non-tender, normal bowel sounds and no masses or organomegaly  BACK: Back symmetric, no curvature., No CVA tenderness  EXTREMITIES:no joint deformities, effusion, or inflammation, no edema, no skin discoloration  NEURO: alert & oriented x 3 with fluent speech, no focal motor/sensory deficits   KPS = 90  100 - Normal; no complaints; no evidence of disease. 90   - Able to carry on normal activity; minor signs or symptoms of  disease. 80   - Normal activity with effort; some signs or symptoms of disease. 61   - Cares for self; unable to carry on normal activity or to do active work. 60   - Requires occasional assistance, but is able to care for most of his personal needs. 50   - Requires considerable assistance and frequent medical care. 40   - Disabled; requires special care and assistance. 30   - Severely disabled; hospital admission is indicated although death not imminent. 20   - Very sick; hospital admission necessary; active supportive treatment necessary. 10   - Moribund; fatal processes progressing rapidly. 0     - Dead  Karnofsky DA, Abelmann WH, Craver LS and [place] J[facility] [phone]) The use of the nitrogen mustards in the palliative treatment of carcinoma: with particular reference to bronchogenic carcinoma Cancer 1 634-56  LABORATORY DATA:  Lab Results    Component Value Date   WBC 11.3* 10/21/2013   HGB 13.8 10/21/2013   HCT 41.6 10/21/2013   MCV 98.8* 10/21/2013   PLT 248 10/21/2013   Lab Results  Component Value Date   NA 141 10/21/2013   K 4.1 10/21/2013   CL 101 09/27/2013   CO2 28 10/21/2013   Lab Results  Component Value Date   ALT 17 10/21/2013   AST 21 10/21/2013   ALKPHOS 52 10/21/2013   BILITOT 0.55 10/21/2013     RADIOGRAPHY: Dg Chest 2 View  10/24/2013   CLINICAL DATA:  Cough.  History of lung cancer.  EXAM: CHEST  2 VIEW  COMPARISON:  PET-CT 10/12/2013; chest radiograph 09/05/2013.  FINDINGS: Stable cardiac and mediastinal contours status post median sternotomy. No large consolidative pulmonary opacity. No definite pleural effusion or pneumothorax. Extensive emphysematous change. Re- demonstrated 2.5 cm mass within the superior segment left lower lobe.  IMPRESSION: Re- demonstrated 2.5 cm mass within the superior segment left lower lobe.  No evidence for superimposed acute cardiopulmonary process.   Electronically Signed   By: Annia Belt M.D.   On: 10/24/2013 16:36   Mr Laqueta Jean RU Contrast  10/25/2013   CLINICAL DATA:  Non-small-cell lung cancer staging.  EXAM: MRI HEAD WITHOUT AND WITH CONTRAST  TECHNIQUE: Multiplanar, multiecho pulse sequences of the brain and surrounding structures were obtained without and with intravenous contrast.  CONTRAST:  13mL MULTIHANCE GADOBENATE DIMEGLUMINE 529 MG/ML IV SOLN  COMPARISON:  None.  FINDINGS: Two punctate foci of susceptibility artifact are present in the white matter of the posterior right frontal and parietal lobes and may reflect remote microhemorrhage. There are multiple small foci of T2 hyperintensity within the subcortical and deep cerebral white matter and pons, nonspecific but compatible with mild chronic small vessel ischemic disease. Ventricles and sulci are within normal limits for age. There is no evidence of acute infarct, mass, midline shift, or extra-axial  collection. There is no abnormal enhancement. Orbits and visualized paranasal sinuses and mastoid air cells are unremarkable. Major intracranial vascular flow voids are unremarkable.  IMPRESSION: No evidence of intracranial metastatic disease.   Electronically Signed   By: Sebastian Ache   On: 10/25/2013 14:36   Nm Pet Image Initial (pi) Skull Base To Thigh  10/12/2013   CLINICAL DATA:  Initial treatment strategy for left lower lobe pulmonary nodule. Hemoptysis  EXAM: NUCLEAR MEDICINE PET SKULL BASE TO THIGH  FASTING BLOOD GLUCOSE:  Value: 114mg /dl  TECHNIQUE: 04.5 mCi W-09 FDG was injected intravenously. CT data was obtained and used for attenuation correction and anatomic localization  only. (This was not acquired as a diagnostic CT examination.) Additional exam technical data entered on technologist worksheet.  COMPARISON:  CT 09/19/2013  FINDINGS: NECK  No hypermetabolic lymph nodes in the neck.  CHEST  Hypermetabolic 2.5 cm nodule in the superior segment of left lower lobe with SUV max 14.1. No additional hypermetabolic nodules. There is a hypermetabolic subcarinal lymph node measuring 18 mm short axis with SUV max 14.4. There is a hypermetabolic AP window node measuring 8 mm (image 80) with SUV max 5.4. No hypermetabolic supraclavicular adenopathy.  ABDOMEN/PELVIS  No abnormal hypermetabolic activity within the liver, pancreas, adrenal glands, or spleen. No hypermetabolic lymph nodes in the abdomen or pelvis.There is a low-density lesion superior aspect of the left hepatic lobe measuring 15 mm (image 118) which does not have associated metabolic activity.  SKELETON  No focal hypermetabolic activity to suggest skeletal metastasis.  IMPRESSION: 1. Hypermetabolic left lower lobe pulmonary nodule consists with bronchogenic carcinoma. 2. Subcarinal and ipsilateral mediastinal nodal metastasis. 3. No distant metastasis.   Staging by FDG PET imaging T1b N2 M0   Electronically Signed   By: Genevive Bi M.D.   On:  10/12/2013 11:23      IMPRESSION: The patient is a very nice 72 year old gentleman with stage IIIA non-small cell carcinoma of the left lung. He has significant subcarinal lymphadenopathy. He would be a good candidate for radiation treatment with concurrent chemotherapy.  PLAN:Today, I talked to the patient and family about the findings and work-up thus far.  We discussed the natural history of disease and general treatment, highlighting the role or radiotherapy in the management.  We discussed the available radiation techniques, and focused on the details of logistics and delivery.  We reviewed the anticipated acute and late sequelae associated with radiation in this setting.  The patient was encouraged to ask questions that I answered to the best of my ability.  I filled out a patient counseling form during our discussion including treatment diagrams.  We retained a copy for our records.  The patient would like to proceed with radiation and will be scheduled for CT simulation later today. We would potentially begin radiation treatment on Monday, January 5.  I spent 60 minutes minutes face to face with the patient and more than 50% of that time was spent in counseling and/or coordination of care.   ------------------------------------------------  Artist Pais. Kathrynn Running, M.D.

## 2013-10-28 NOTE — Progress Notes (Signed)
Patient here today with his wife, [name] and one of his four sons, [name]. [employer]. Denies pain at this time. Reports an occasional burning pain deep in his chest "similar to reflux." Reports a dry cough. Reports shortness of breath with strenuous activity. Denies headache, dizziness, nausea or vomiting. Five pound weight loss reported. Wife reports her husband has a decreased appetite. Patient plays golf three times a week and has been retired for five years. Reports he is able to lay flat to sleep. Taking prednisone daily.

## 2013-10-28 NOTE — Progress Notes (Signed)
  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  Identity was confirmed.  All relevant records and images related to the planned course of therapy were reviewed.  The patient freely provided informed written consent to proceed with treatment after reviewing the details related to the planned course of therapy. The consent form was witnessed and verified by the simulation staff.  Then, the patient was set-up in a stable reproducible  supine position for radiation therapy.  CT images were obtained.  Surface markings were placed.  The CT images were loaded into the planning software.  Then the target and avoidance structures were contoured.  Treatment planning then occurred.  The radiation prescription was entered and confirmed.  Then, I designed and supervised the construction of a total of one medically necessary complex treatment device, a body fix vacuum pillow mold for positioning.  I have requested : 3D Simulation  I have requested a DVH of the following structures: targets, spinal cord lungs, and heart.  I have ordered:Nutrition Consult  This treatment constitutes a Special Treatment Procedure for the following reason: [ Concurrent chemotherapy requiring careful monitoring for increased toxicities of treatment including weekly laboratory values.  PLAN:  The patient will receive 66 Gy in 33 fractions.  ________________________________  Artist Pais Kathrynn Running, M.D.

## 2013-10-31 ENCOUNTER — Other Ambulatory Visit: Payer: Medicare Other

## 2013-11-01 ENCOUNTER — Ambulatory Visit (HOSPITAL_BASED_OUTPATIENT_CLINIC_OR_DEPARTMENT_OTHER): Payer: Medicare Other

## 2013-11-01 ENCOUNTER — Other Ambulatory Visit (HOSPITAL_BASED_OUTPATIENT_CLINIC_OR_DEPARTMENT_OTHER): Payer: Medicare Other

## 2013-11-01 ENCOUNTER — Other Ambulatory Visit (HOSPITAL_COMMUNITY)
Admission: RE | Admit: 2013-11-01 | Discharge: 2013-11-01 | Disposition: A | Discharge status: Home / Self Care | Payer: Medicare Other | Source: Ambulatory Visit | Attending: Internal Medicine | Admitting: Internal Medicine

## 2013-11-01 VITALS — BP 118/70 | HR 64 | Temp 98.3°F | Resp 17

## 2013-11-01 DIAGNOSIS — C3430 Malignant neoplasm of lower lobe, unspecified bronchus or lung: Secondary | ICD-10-CM

## 2013-11-01 DIAGNOSIS — C3492 Malignant neoplasm of unspecified part of left bronchus or lung: Secondary | ICD-10-CM

## 2013-11-01 DIAGNOSIS — Z5111 Encounter for antineoplastic chemotherapy: Secondary | ICD-10-CM

## 2013-11-01 DIAGNOSIS — C3490 Malignant neoplasm of unspecified part of unspecified bronchus or lung: Secondary | ICD-10-CM | POA: Insufficient documentation

## 2013-11-01 LAB — COMPREHENSIVE METABOLIC PANEL (CC13)
Albumin: 2.5 g/dL — ABNORMAL LOW (ref 3.5–5.0)
Anion Gap: 10 mEq/L (ref 3–11)
CO2: 25 mEq/L (ref 22–29)
Chloride: 104 mEq/L (ref 98–109)
Creatinine: 1 mg/dL (ref 0.7–1.3)
Glucose: 102 mg/dl (ref 70–140)
Potassium: 4.2 mEq/L (ref 3.5–5.1)
Sodium: 139 mEq/L (ref 136–145)
Total Protein: 5.4 g/dL — ABNORMAL LOW (ref 6.4–8.3)

## 2013-11-01 LAB — CBC WITH DIFFERENTIAL/PLATELET
BASO%: 0.7 % (ref 0.0–2.0)
Eosinophils Absolute: 0.3 10*3/uL (ref 0.0–0.5)
MCH: 32.3 pg (ref 27.2–33.4)
MCHC: 33.2 g/dL (ref 32.0–36.0)
MONO#: 1.3 10*3/uL — ABNORMAL HIGH (ref 0.1–0.9)
NEUT#: 8.5 10*3/uL — ABNORMAL HIGH (ref 1.5–6.5)
RBC: 4.09 10*6/uL — ABNORMAL LOW (ref 4.20–5.82)
RDW: 13.3 % (ref 11.0–14.6)
WBC: 12.1 10*3/uL — ABNORMAL HIGH (ref 4.0–10.3)
lymph#: 1.8 10*3/uL (ref 0.9–3.3)
nRBC: 0 % (ref 0–0)

## 2013-11-01 LAB — TECHNOLOGIST REVIEW

## 2013-11-01 MED ORDER — FAMOTIDINE IN NACL 20-0.9 MG/50ML-% IV SOLN
INTRAVENOUS | Status: AC
  Filled 2013-11-01: qty 50

## 2013-11-01 MED ORDER — DEXAMETHASONE SODIUM PHOSPHATE 20 MG/5ML IJ SOLN
20.0000 mg | Freq: Once | INTRAMUSCULAR | Status: AC
  Administered 2013-11-01: 20 mg via INTRAVENOUS

## 2013-11-01 MED ORDER — SODIUM CHLORIDE 0.9 % IV SOLN
Freq: Once | INTRAVENOUS | Status: AC
  Administered 2013-11-01: 09:00:00 via INTRAVENOUS

## 2013-11-01 MED ORDER — DEXAMETHASONE SODIUM PHOSPHATE 20 MG/5ML IJ SOLN
INTRAMUSCULAR | Status: AC
Start: 2013-11-01 — End: 2013-11-01
  Filled 2013-11-01: qty 5

## 2013-11-01 MED ORDER — FAMOTIDINE IN NACL 20-0.9 MG/50ML-% IV SOLN
20.0000 mg | Freq: Once | INTRAVENOUS | Status: AC
  Administered 2013-11-01: 20 mg via INTRAVENOUS

## 2013-11-01 MED ORDER — SODIUM CHLORIDE 0.9 % IV SOLN
45.0000 mg/m2 | Freq: Once | INTRAVENOUS | Status: AC
  Administered 2013-11-01: 78 mg via INTRAVENOUS
  Filled 2013-11-01: qty 13

## 2013-11-01 MED ORDER — SODIUM CHLORIDE 0.9 % IV SOLN
149.6000 mg | Freq: Once | INTRAVENOUS | Status: AC
  Administered 2013-11-01: 150 mg via INTRAVENOUS
  Filled 2013-11-01: qty 15

## 2013-11-01 MED ORDER — ONDANSETRON 16 MG/50ML IVPB (CHCC)
16.0000 mg | Freq: Once | INTRAVENOUS | Status: AC
  Administered 2013-11-01: 16 mg via INTRAVENOUS

## 2013-11-01 MED ORDER — ONDANSETRON 16 MG/50ML IVPB (CHCC)
INTRAVENOUS | Status: AC
  Filled 2013-11-01: qty 16

## 2013-11-01 MED ORDER — DIPHENHYDRAMINE HCL 50 MG/ML IJ SOLN
INTRAMUSCULAR | Status: AC
  Filled 2013-11-01: qty 1

## 2013-11-01 MED ORDER — DIPHENHYDRAMINE HCL 50 MG/ML IJ SOLN
50.0000 mg | Freq: Once | INTRAMUSCULAR | Status: AC
  Administered 2013-11-01: 50 mg via INTRAVENOUS

## 2013-11-01 NOTE — Patient Instructions (Signed)
[facility] Discharge Instructions for Patients Receiving Chemotherapy  Today you received the following chemotherapy agents: Taxol and Carboplatin  To help prevent nausea and vomiting after your treatment, we encourage you to take your nausea medication: Compazine 10 mg every 6 hrs as needed.   If you develop nausea and vomiting that is not controlled by your nausea medication, call the clinic.   BELOW ARE SYMPTOMS THAT SHOULD BE REPORTED IMMEDIATELY:  *FEVER GREATER THAN 100.5 F  *CHILLS WITH OR WITHOUT FEVER  NAUSEA AND VOMITING THAT IS NOT CONTROLLED WITH YOUR NAUSEA MEDICATION  *UNUSUAL SHORTNESS OF BREATH  *UNUSUAL BRUISING OR BLEEDING  TENDERNESS IN MOUTH AND THROAT WITH OR WITHOUT PRESENCE OF ULCERS  *URINARY PROBLEMS  *BOWEL PROBLEMS  UNUSUAL RASH Items with * indicate a potential emergency and should be followed up as soon as possible  Feel free to call the clinic you have any questions or concerns. The clinic phone number is [phone].   Paclitaxel injection (Taxol) What is this medicine? PACLITAXEL (PAK li TAX el) is a chemotherapy drug. It targets fast dividing cells, like cancer cells, and causes these cells to die. This medicine is used to treat ovarian cancer, breast cancer, and other cancers. This medicine may be used for other purposes; ask your health care provider or pharmacist if you have questions. COMMON BRAND NAME(S): Onxol , Taxol What should I tell my health care provider before I take this medicine? They need to know if you have any of these conditions: -blood disorders -irregular heartbeat -infection (especially a virus infection such as chickenpox, cold sores, or herpes) -liver disease -previous or ongoing radiation therapy -an unusual or allergic reaction to paclitaxel, alcohol, polyoxyethylated castor oil, other chemotherapy agents, other medicines, foods, dyes, or preservatives -pregnant or trying to get  pregnant -breast-feeding How should I use this medicine? This drug is given as an infusion into a vein. It is administered in a hospital or clinic by a specially trained health care professional. Talk to your pediatrician regarding the use of this medicine in children. Special care may be needed. Overdosage: If you think you have taken too much of this medicine contact a poison control center or emergency room at once. NOTE: This medicine is only for you. Do not share this medicine with others. What if I miss a dose? It is important not to miss your dose. Call your doctor or health care professional if you are unable to keep an appointment. What may interact with this medicine? Do not take this medicine with any of the following medications: -disulfiram -metronidazole This medicine may also interact with the following medications: -cyclosporine -diazepam -ketoconazole -medicines to increase blood counts like filgrastim, pegfilgrastim, sargramostim -other chemotherapy drugs like cisplatin, doxorubicin, epirubicin, etoposide, teniposide, vincristine -quinidine -testosterone -vaccines -verapamil Talk to your doctor or health care professional before taking any of these medicines: -acetaminophen -aspirin -ibuprofen -ketoprofen -naproxen This list may not describe all possible interactions. Give your health care provider a list of all the medicines, herbs, non-prescription drugs, or dietary supplements you use. Also tell them if you smoke, drink alcohol, or use illegal drugs. Some items may interact with your medicine. What should I watch for while using this medicine? Your condition will be monitored carefully while you are receiving this medicine. You will need important blood work done while you are taking this medicine. This drug may make you feel generally unwell. This is not uncommon, as chemotherapy can affect healthy cells as well as cancer  cells. Report any side effects. Continue  your course of treatment even though you feel ill unless your doctor tells you to stop. In some cases, you may be given additional medicines to help with side effects. Follow all directions for their use. Call your doctor or health care professional for advice if you get a fever, chills or sore throat, or other symptoms of a cold or flu. Do not treat yourself. This drug decreases your body's ability to fight infections. Try to avoid being around people who are sick. This medicine may increase your risk to bruise or bleed. Call your doctor or health care professional if you notice any unusual bleeding. Be careful brushing and flossing your teeth or using a toothpick because you may get an infection or bleed more easily. If you have any dental work done, tell your dentist you are receiving this medicine. Avoid taking products that contain aspirin, acetaminophen, ibuprofen, naproxen, or ketoprofen unless instructed by your doctor. These medicines may hide a fever. Do not become pregnant while taking this medicine. Women should inform their doctor if they wish to become pregnant or think they might be pregnant. There is a potential for serious side effects to an unborn child. Talk to your health care professional or pharmacist for more information. Do not breast-feed an infant while taking this medicine. Men are advised not to father a child while receiving this medicine. What side effects may I notice from receiving this medicine? Side effects that you should report to your doctor or health care professional as soon as possible: -allergic reactions like skin rash, itching or hives, swelling of the face, lips, or tongue -low blood counts - This drug may decrease the number of white blood cells, red blood cells and platelets. You may be at increased risk for infections and bleeding. -signs of infection - fever or chills, cough, sore throat, pain or difficulty passing urine -signs of decreased platelets or  bleeding - bruising, pinpoint red spots on the skin, black, tarry stools, nosebleeds -signs of decreased red blood cells - unusually weak or tired, fainting spells, lightheadedness -breathing problems -chest pain -high or low blood pressure -mouth sores -nausea and vomiting -pain, swelling, redness or irritation at the injection site -pain, tingling, numbness in the hands or feet -slow or irregular heartbeat -swelling of the ankle, feet, hands Side effects that usually do not require medical attention (report to your doctor or health care professional if they continue or are bothersome): -bone pain -complete hair loss including hair on your head, underarms, pubic hair, eyebrows, and eyelashes -changes in the color of fingernails -diarrhea -loosening of the fingernails -loss of appetite -muscle or joint pain -red flush to skin -sweating This list may not describe all possible side effects. Call your doctor for medical advice about side effects. You may report side effects to FDA at 1-800-FDA-1088. Where should I keep my medicine? This drug is given in a hospital or clinic and will not be stored at home. NOTE: This sheet is a summary. It may not cover all possible information. If you have questions about this medicine, talk to your doctor, pharmacist, or health care provider.  2014, Elsevier/Gold Standard. (2012-12-13 16:41:21)  Carboplatin injection What is this medicine? CARBOPLATIN (KAR boe pla tin) is a chemotherapy drug. It targets fast dividing cells, like cancer cells, and causes these cells to die. This medicine is used to treat ovarian cancer and many other cancers. This medicine may be used for other purposes; ask your health  care provider or pharmacist if you have questions. COMMON BRAND NAME(S): Paraplatin What should I tell my health care provider before I take this medicine? They need to know if you have any of these conditions: -blood disorders -hearing  problems -kidney disease -recent or ongoing radiation therapy -an unusual or allergic reaction to carboplatin, cisplatin, other chemotherapy, other medicines, foods, dyes, or preservatives -pregnant or trying to get pregnant -breast-feeding How should I use this medicine? This drug is usually given as an infusion into a vein. It is administered in a hospital or clinic by a specially trained health care professional. Talk to your pediatrician regarding the use of this medicine in children. Special care may be needed. Overdosage: If you think you have taken too much of this medicine contact a poison control center or emergency room at once. NOTE: This medicine is only for you. Do not share this medicine with others. What if I miss a dose? It is important not to miss a dose. Call your doctor or health care professional if you are unable to keep an appointment. What may interact with this medicine? -medicines for seizures -medicines to increase blood counts like filgrastim, pegfilgrastim, sargramostim -some antibiotics like amikacin, gentamicin, neomycin, streptomycin, tobramycin -vaccines Talk to your doctor or health care professional before taking any of these medicines: -acetaminophen -aspirin -ibuprofen -ketoprofen -naproxen This list may not describe all possible interactions. Give your health care provider a list of all the medicines, herbs, non-prescription drugs, or dietary supplements you use. Also tell them if you smoke, drink alcohol, or use illegal drugs. Some items may interact with your medicine. What should I watch for while using this medicine? Your condition will be monitored carefully while you are receiving this medicine. You will need important blood work done while you are taking this medicine. This drug may make you feel generally unwell. This is not uncommon, as chemotherapy can affect healthy cells as well as cancer cells. Report any side effects. Continue your course  of treatment even though you feel ill unless your doctor tells you to stop. In some cases, you may be given additional medicines to help with side effects. Follow all directions for their use. Call your doctor or health care professional for advice if you get a fever, chills or sore throat, or other symptoms of a cold or flu. Do not treat yourself. This drug decreases your body's ability to fight infections. Try to avoid being around people who are sick. This medicine may increase your risk to bruise or bleed. Call your doctor or health care professional if you notice any unusual bleeding. Be careful brushing and flossing your teeth or using a toothpick because you may get an infection or bleed more easily. If you have any dental work done, tell your dentist you are receiving this medicine. Avoid taking products that contain aspirin, acetaminophen, ibuprofen, naproxen, or ketoprofen unless instructed by your doctor. These medicines may hide a fever. Do not become pregnant while taking this medicine. Women should inform their doctor if they wish to become pregnant or think they might be pregnant. There is a potential for serious side effects to an unborn child. Talk to your health care professional or pharmacist for more information. Do not breast-feed an infant while taking this medicine. What side effects may I notice from receiving this medicine? Side effects that you should report to your doctor or health care professional as soon as possible: -allergic reactions like skin rash, itching or hives, swelling of the  face, lips, or tongue -signs of infection - fever or chills, cough, sore throat, pain or difficulty passing urine -signs of decreased platelets or bleeding - bruising, pinpoint red spots on the skin, black, tarry stools, nosebleeds -signs of decreased red blood cells - unusually weak or tired, fainting spells, lightheadedness -breathing problems -changes in hearing -changes in  vision -chest pain -high blood pressure -low blood counts - This drug may decrease the number of white blood cells, red blood cells and platelets. You may be at increased risk for infections and bleeding. -nausea and vomiting -pain, swelling, redness or irritation at the injection site -pain, tingling, numbness in the hands or feet -problems with balance, talking, walking -trouble passing urine or change in the amount of urine Side effects that usually do not require medical attention (report to your doctor or health care professional if they continue or are bothersome): -hair loss -loss of appetite -metallic taste in the mouth or changes in taste This list may not describe all possible side effects. Call your doctor for medical advice about side effects. You may report side effects to FDA at 1-800-FDA-1088. Where should I keep my medicine? This drug is given in a hospital or clinic and will not be stored at home. NOTE: This sheet is a summary. It may not cover all possible information. If you have questions about this medicine, talk to your doctor, pharmacist, or health care provider.  2014, [employer]vier/Gold Standard. (2008-01-25 14:38:05)

## 2013-11-02 ENCOUNTER — Telehealth: Payer: Self-pay | Admitting: *Deleted

## 2013-11-02 NOTE — Telephone Encounter (Signed)
NO complaints and no questions or concerns. Knows how to contact office for any problems

## 2013-11-03 LAB — AFB CULTURE WITH SMEAR (NOT AT ARMC)
Acid Fast Smear: NONE SEEN
Special Requests: NORMAL

## 2013-11-06 ENCOUNTER — Other Ambulatory Visit: Payer: Self-pay | Admitting: Internal Medicine

## 2013-11-07 ENCOUNTER — Other Ambulatory Visit: Payer: Self-pay | Admitting: Internal Medicine

## 2013-11-07 ENCOUNTER — Encounter: Payer: Self-pay | Admitting: *Deleted

## 2013-11-07 ENCOUNTER — Ambulatory Visit
Admission: RE | Admit: 2013-11-07 | Discharge: 2013-11-07 | Disposition: A | Discharge status: Home / Self Care | Payer: Medicare Other | Source: Ambulatory Visit | Attending: Radiation Oncology | Admitting: Radiation Oncology

## 2013-11-07 ENCOUNTER — Ambulatory Visit (HOSPITAL_BASED_OUTPATIENT_CLINIC_OR_DEPARTMENT_OTHER): Payer: Medicare Other

## 2013-11-07 ENCOUNTER — Encounter (HOSPITAL_COMMUNITY): Payer: Self-pay

## 2013-11-07 ENCOUNTER — Other Ambulatory Visit (HOSPITAL_BASED_OUTPATIENT_CLINIC_OR_DEPARTMENT_OTHER): Payer: Medicare Other

## 2013-11-07 VITALS — BP 130/69 | HR 73 | Temp 96.9°F | Resp 18

## 2013-11-07 DIAGNOSIS — C3492 Malignant neoplasm of unspecified part of left bronchus or lung: Secondary | ICD-10-CM

## 2013-11-07 DIAGNOSIS — C3430 Malignant neoplasm of lower lobe, unspecified bronchus or lung: Secondary | ICD-10-CM

## 2013-11-07 DIAGNOSIS — Z5111 Encounter for antineoplastic chemotherapy: Secondary | ICD-10-CM

## 2013-11-07 LAB — CBC WITH DIFFERENTIAL/PLATELET
BASO%: 0.8 % (ref 0.0–2.0)
BASOS ABS: 0.1 10*3/uL (ref 0.0–0.1)
EOS ABS: 0.2 10*3/uL (ref 0.0–0.5)
EOS%: 1.5 % (ref 0.0–7.0)
HEMATOCRIT: 38.3 % — AB (ref 38.4–49.9)
HGB: 12.7 g/dL — ABNORMAL LOW (ref 13.0–17.1)
LYMPH%: 6.2 % — AB (ref 14.0–49.0)
MCH: 32.9 pg (ref 27.2–33.4)
MCHC: 33.3 g/dL (ref 32.0–36.0)
MCV: 98.8 fL — AB (ref 79.3–98.0)
MONO#: 0.6 10*3/uL (ref 0.1–0.9)
MONO%: 4.6 % (ref 0.0–14.0)
NEUT%: 86.9 % — AB (ref 39.0–75.0)
NEUTROS ABS: 10.7 10*3/uL — AB (ref 1.5–6.5)
PLATELETS: 259 10*3/uL (ref 140–400)
RBC: 3.87 10*6/uL — ABNORMAL LOW (ref 4.20–5.82)
RDW: 13.5 % (ref 11.0–14.6)
WBC: 12.3 10*3/uL — AB (ref 4.0–10.3)
lymph#: 0.8 10*3/uL — ABNORMAL LOW (ref 0.9–3.3)

## 2013-11-07 LAB — COMPREHENSIVE METABOLIC PANEL (CC13)
ALT: 16 U/L (ref 0–55)
ANION GAP: 11 meq/L (ref 3–11)
AST: 14 U/L (ref 5–34)
Albumin: 2.8 g/dL — ABNORMAL LOW (ref 3.5–5.0)
Alkaline Phosphatase: 51 U/L (ref 40–150)
BUN: 20 mg/dL (ref 7.0–26.0)
CO2: 24 meq/L (ref 22–29)
CREATININE: 1.2 mg/dL (ref 0.7–1.3)
Calcium: 8.9 mg/dL (ref 8.4–10.4)
Chloride: 107 mEq/L (ref 98–109)
Glucose: 134 mg/dl (ref 70–140)
Potassium: 4.1 mEq/L (ref 3.5–5.1)
SODIUM: 142 meq/L (ref 136–145)
TOTAL PROTEIN: 5.6 g/dL — AB (ref 6.4–8.3)
Total Bilirubin: 0.52 mg/dL (ref 0.20–1.20)

## 2013-11-07 LAB — TECHNOLOGIST REVIEW

## 2013-11-07 MED ORDER — DIPHENHYDRAMINE HCL 50 MG/ML IJ SOLN
INTRAMUSCULAR | Status: AC
  Filled 2013-11-07: qty 1

## 2013-11-07 MED ORDER — DEXAMETHASONE SODIUM PHOSPHATE 20 MG/5ML IJ SOLN
20.0000 mg | Freq: Once | INTRAMUSCULAR | Status: AC
  Administered 2013-11-07: 20 mg via INTRAVENOUS

## 2013-11-07 MED ORDER — FAMOTIDINE IN NACL 20-0.9 MG/50ML-% IV SOLN
INTRAVENOUS | Status: AC
  Filled 2013-11-07: qty 50

## 2013-11-07 MED ORDER — PACLITAXEL CHEMO INJECTION 300 MG/50ML
45.0000 mg/m2 | Freq: Once | INTRAVENOUS | Status: AC
  Administered 2013-11-07: 78 mg via INTRAVENOUS
  Filled 2013-11-07: qty 13

## 2013-11-07 MED ORDER — DEXAMETHASONE SODIUM PHOSPHATE 20 MG/5ML IJ SOLN
INTRAMUSCULAR | Status: AC
  Filled 2013-11-07: qty 5

## 2013-11-07 MED ORDER — ONDANSETRON 16 MG/50ML IVPB (CHCC)
INTRAVENOUS | Status: AC
  Filled 2013-11-07: qty 16

## 2013-11-07 MED ORDER — SODIUM CHLORIDE 0.9 % IV SOLN
Freq: Once | INTRAVENOUS | Status: AC
  Administered 2013-11-07: 12:00:00 via INTRAVENOUS

## 2013-11-07 MED ORDER — SODIUM CHLORIDE 0.9 % IV SOLN
149.6000 mg | Freq: Once | INTRAVENOUS | Status: AC
  Administered 2013-11-07: 150 mg via INTRAVENOUS
  Filled 2013-11-07: qty 15

## 2013-11-07 MED ORDER — ONDANSETRON 16 MG/50ML IVPB (CHCC)
16.0000 mg | Freq: Once | INTRAVENOUS | Status: AC
  Administered 2013-11-07: 16 mg via INTRAVENOUS

## 2013-11-07 MED ORDER — DIPHENHYDRAMINE HCL 50 MG/ML IJ SOLN
50.0000 mg | Freq: Once | INTRAMUSCULAR | Status: AC
  Administered 2013-11-07: 50 mg via INTRAVENOUS

## 2013-11-07 MED ORDER — FAMOTIDINE IN NACL 20-0.9 MG/50ML-% IV SOLN
20.0000 mg | Freq: Once | INTRAVENOUS | Status: AC
  Administered 2013-11-07: 20 mg via INTRAVENOUS

## 2013-11-07 NOTE — Progress Notes (Signed)
Patient reports diarrhea x4 in the last 12 hours. No blood in stool. Also reporting tenderness at nails and peeling of skin aware from nail beds. Pt and wife report that this occurred prior to chemo and believe it to be a reaction from a previous antibiotic that the patient has stopped taking. Per Dr. Julien Nordmann, take immodium 1 tablet per stool and apply lotion to hands and nails. Instructions given to patient verbally with teach back, patient and wife verbalize understanding.

## 2013-11-07 NOTE — Telephone Encounter (Signed)
Ok to Rf Triamcinolone. It was d/c'd. Please address/advise.

## 2013-11-07 NOTE — Patient Instructions (Signed)
[facility] Discharge Instructions for Patients Receiving Chemotherapy  Today you received the following chemotherapy agents: Taxol and Carboplatin.  To help prevent nausea and vomiting after your treatment, we encourage you to take your nausea medication as prescribed.   If you develop nausea and vomiting that is not controlled by your nausea medication, call the clinic.   BELOW ARE SYMPTOMS THAT SHOULD BE REPORTED IMMEDIATELY:  *FEVER GREATER THAN 100.5 F  *CHILLS WITH OR WITHOUT FEVER  NAUSEA AND VOMITING THAT IS NOT CONTROLLED WITH YOUR NAUSEA MEDICATION  *UNUSUAL SHORTNESS OF BREATH  *UNUSUAL BRUISING OR BLEEDING  TENDERNESS IN MOUTH AND THROAT WITH OR WITHOUT PRESENCE OF ULCERS  *URINARY PROBLEMS  *BOWEL PROBLEMS  UNUSUAL RASH Items with * indicate a potential emergency and should be followed up as soon as possible.  Feel free to call the clinic you have any questions or concerns. The clinic phone number is [phone].    

## 2013-11-07 NOTE — Progress Notes (Signed)
Received fax today from [facility] on Cameron [name].    EGFR Alteration not detected ALK Gene Rearrangement not detected   Results placed on Dr. Worthy Flank desk

## 2013-11-08 ENCOUNTER — Ambulatory Visit
Admission: RE | Admit: 2013-11-08 | Discharge: 2013-11-08 | Disposition: A | Discharge status: Home / Self Care | Payer: Medicare Other | Source: Ambulatory Visit | Attending: Radiation Oncology | Admitting: Radiation Oncology

## 2013-11-09 ENCOUNTER — Ambulatory Visit
Admission: RE | Admit: 2013-11-09 | Discharge: 2013-11-09 | Disposition: A | Discharge status: Home / Self Care | Payer: Medicare Other | Source: Ambulatory Visit | Attending: Radiation Oncology | Admitting: Radiation Oncology

## 2013-11-10 ENCOUNTER — Ambulatory Visit
Admission: RE | Admit: 2013-11-10 | Discharge: 2013-11-10 | Disposition: A | Discharge status: Home / Self Care | Payer: Medicare Other | Source: Ambulatory Visit | Attending: Radiation Oncology | Admitting: Radiation Oncology

## 2013-11-11 ENCOUNTER — Ambulatory Visit
Admission: RE | Admit: 2013-11-11 | Discharge: 2013-11-11 | Disposition: A | Discharge status: Home / Self Care | Payer: Medicare Other | Source: Ambulatory Visit | Attending: Radiation Oncology | Admitting: Radiation Oncology

## 2013-11-11 ENCOUNTER — Ambulatory Visit: Payer: Medicare Other | Admitting: Radiation Oncology

## 2013-11-11 ENCOUNTER — Telehealth: Payer: Self-pay | Admitting: *Deleted

## 2013-11-11 VITALS — BP 127/75 | HR 73 | Temp 98.3°F | Ht 68.0 in | Wt 133.9 lb

## 2013-11-11 DIAGNOSIS — C3492 Malignant neoplasm of unspecified part of left bronchus or lung: Secondary | ICD-10-CM

## 2013-11-11 MED ORDER — RADIAPLEXRX EX GEL
Freq: Once | CUTANEOUS | Status: AC
  Administered 2013-11-11: 17:00:00 via TOPICAL

## 2013-11-11 NOTE — Telephone Encounter (Signed)
Pt called stating he is coughing up a quarter sized amount of bright red blood and mucous.  He has done this 2x's today.  No fever. Per Dr Vista Mink, will evaluate at f/u appt on Monday 11/14/13.  Pt is to call back if anything changes.  SLJ

## 2013-11-11 NOTE — Progress Notes (Signed)
Patient having some concerns about hemoptysis.Asked patient to come in prior to radiation for assessment by nurse and doctor at 3:30 pm and he has agreed to come in.

## 2013-11-11 NOTE — Progress Notes (Addendum)
Cameron [name] has had 4 fractions to his chest.  He is having pain in his midsternal/upper abdomen area that he is rating at a 4/10.  He states it burns and comes and goes.  He reports coughing up bright red blood, about a quarter size, three times today before lunch time.  He reports that his nose was also bleeding a little bit.  He has a frequent cough that is about the same as before radiation.  His wife says that it is better because he is not coughing all night.  He states he always has shortness of breath.  His oxygen saturation was 95% on room air today.   He has lost 4 lbs since 10/28/13.  He reports that he does not feel like eating.  He also has a rash over his arms, chest and back.  He says he has had it since November and was from an antibiotic.  Gave patient the Radiation Therapy and You book and went over possible side effects including pain, skin changes, throat changes and fatigue.  He was given radiaplex gel and was instructed to apply it to his chest twice a day in the morning and at bedtime.

## 2013-11-13 ENCOUNTER — Other Ambulatory Visit: Payer: Self-pay | Admitting: Internal Medicine

## 2013-11-13 ENCOUNTER — Encounter: Payer: Self-pay | Admitting: Radiation Oncology

## 2013-11-13 NOTE — Progress Notes (Signed)
  Radiation Oncology         [phone] ________________________________  Name: Cameron [name] MRN: [id number]  Date: 11/11/2013  DOB: 26-Feb-1941  Weekly Radiation Therapy Management  Current Dose/Planned Dose:  Reviewed/Approved  Narrative . . . . . . . . The patient presents for routine under treatment assessment.                                   The patient is without complaint.                                 Set-up films were reviewed.                                 The chart was checked. Physical Findings. . .  height is 5\' 8"  (1.727 m) and weight is 133 lb 14.4 oz (60.737 kg). His temperature is 98.3 F (36.8 C). His blood pressure is 127/75 and his pulse is 73. His oxygen saturation is 95%. . Weight essentially stable.  No significant changes. Impression . . . . . . . The patient is tolerating radiation. Plan . . . . . . . . . . . . Continue treatment as planned.  ________________________________  Sheral Apley. Tammi Klippel, M.D.

## 2013-11-14 ENCOUNTER — Telehealth: Payer: Self-pay | Admitting: *Deleted

## 2013-11-14 ENCOUNTER — Telehealth: Payer: Self-pay | Admitting: Internal Medicine

## 2013-11-14 ENCOUNTER — Ambulatory Visit (HOSPITAL_BASED_OUTPATIENT_CLINIC_OR_DEPARTMENT_OTHER): Payer: Medicare Other

## 2013-11-14 ENCOUNTER — Encounter: Payer: Self-pay | Admitting: Internal Medicine

## 2013-11-14 ENCOUNTER — Ambulatory Visit
Admission: RE | Admit: 2013-11-14 | Discharge: 2013-11-14 | Disposition: A | Discharge status: Home / Self Care | Payer: Medicare Other | Source: Ambulatory Visit | Attending: Radiation Oncology | Admitting: Radiation Oncology

## 2013-11-14 ENCOUNTER — Ambulatory Visit (HOSPITAL_BASED_OUTPATIENT_CLINIC_OR_DEPARTMENT_OTHER): Payer: Medicare Other | Admitting: Physician Assistant

## 2013-11-14 ENCOUNTER — Encounter: Payer: Self-pay | Admitting: Physician Assistant

## 2013-11-14 VITALS — BP 120/87 | HR 85 | Resp 20

## 2013-11-14 VITALS — BP 92/61 | HR 93 | Temp 97.2°F | Resp 18 | Ht 68.0 in | Wt 132.2 lb

## 2013-11-14 DIAGNOSIS — C3430 Malignant neoplasm of lower lobe, unspecified bronchus or lung: Secondary | ICD-10-CM

## 2013-11-14 DIAGNOSIS — I959 Hypotension, unspecified: Secondary | ICD-10-CM

## 2013-11-14 DIAGNOSIS — C3492 Malignant neoplasm of unspecified part of left bronchus or lung: Secondary | ICD-10-CM

## 2013-11-14 DIAGNOSIS — Z5111 Encounter for antineoplastic chemotherapy: Secondary | ICD-10-CM

## 2013-11-14 LAB — COMPREHENSIVE METABOLIC PANEL (CC13)
ALBUMIN: 3 g/dL — AB (ref 3.5–5.0)
ALK PHOS: 51 U/L (ref 40–150)
ALT: 19 U/L (ref 0–55)
AST: 14 U/L (ref 5–34)
Anion Gap: 11 mEq/L (ref 3–11)
BUN: 24.2 mg/dL (ref 7.0–26.0)
CO2: 23 mEq/L (ref 22–29)
Calcium: 9.5 mg/dL (ref 8.4–10.4)
Chloride: 104 mEq/L (ref 98–109)
Creatinine: 1.3 mg/dL (ref 0.7–1.3)
Glucose: 110 mg/dl (ref 70–140)
POTASSIUM: 4.6 meq/L (ref 3.5–5.1)
SODIUM: 138 meq/L (ref 136–145)
TOTAL PROTEIN: 5.9 g/dL — AB (ref 6.4–8.3)
Total Bilirubin: 0.68 mg/dL (ref 0.20–1.20)

## 2013-11-14 LAB — CBC WITH DIFFERENTIAL/PLATELET
BASO%: 0.7 % (ref 0.0–2.0)
Basophils Absolute: 0 10*3/uL (ref 0.0–0.1)
EOS%: 1.1 % (ref 0.0–7.0)
Eosinophils Absolute: 0.1 10*3/uL (ref 0.0–0.5)
HCT: 37.6 % — ABNORMAL LOW (ref 38.4–49.9)
HGB: 12.7 g/dL — ABNORMAL LOW (ref 13.0–17.1)
LYMPH%: 9.7 % — ABNORMAL LOW (ref 14.0–49.0)
MCH: 33.3 pg (ref 27.2–33.4)
MCHC: 33.7 g/dL (ref 32.0–36.0)
MCV: 98.6 fL — AB (ref 79.3–98.0)
MONO#: 0.7 10*3/uL (ref 0.1–0.9)
MONO%: 12.3 % (ref 0.0–14.0)
NEUT#: 4.2 10*3/uL (ref 1.5–6.5)
NEUT%: 76.2 % — ABNORMAL HIGH (ref 39.0–75.0)
Platelets: 249 10*3/uL (ref 140–400)
RBC: 3.82 10*6/uL — AB (ref 4.20–5.82)
RDW: 13.2 % (ref 11.0–14.6)
WBC: 5.5 10*3/uL (ref 4.0–10.3)
lymph#: 0.5 10*3/uL — ABNORMAL LOW (ref 0.9–3.3)

## 2013-11-14 LAB — TECHNOLOGIST REVIEW

## 2013-11-14 MED ORDER — DEXAMETHASONE SODIUM PHOSPHATE 20 MG/5ML IJ SOLN
20.0000 mg | Freq: Once | INTRAMUSCULAR | Status: AC
  Administered 2013-11-14: 20 mg via INTRAVENOUS

## 2013-11-14 MED ORDER — CARBOPLATIN CHEMO INJECTION 450 MG/45ML
149.6000 mg | Freq: Once | INTRAVENOUS | Status: AC
  Administered 2013-11-14: 150 mg via INTRAVENOUS
  Filled 2013-11-14: qty 15

## 2013-11-14 MED ORDER — DIPHENHYDRAMINE HCL 50 MG/ML IJ SOLN
INTRAMUSCULAR | Status: AC
  Filled 2013-11-14: qty 1

## 2013-11-14 MED ORDER — ONDANSETRON 16 MG/50ML IVPB (CHCC)
INTRAVENOUS | Status: AC
  Filled 2013-11-14: qty 16

## 2013-11-14 MED ORDER — FAMOTIDINE IN NACL 20-0.9 MG/50ML-% IV SOLN
INTRAVENOUS | Status: AC
  Filled 2013-11-14: qty 50

## 2013-11-14 MED ORDER — PACLITAXEL CHEMO INJECTION 300 MG/50ML
45.0000 mg/m2 | Freq: Once | INTRAVENOUS | Status: AC
  Administered 2013-11-14: 78 mg via INTRAVENOUS
  Filled 2013-11-14: qty 13

## 2013-11-14 MED ORDER — DEXAMETHASONE SODIUM PHOSPHATE 20 MG/5ML IJ SOLN
INTRAMUSCULAR | Status: AC
  Filled 2013-11-14: qty 5

## 2013-11-14 MED ORDER — FAMOTIDINE IN NACL 20-0.9 MG/50ML-% IV SOLN
20.0000 mg | Freq: Once | INTRAVENOUS | Status: AC
  Administered 2013-11-14: 20 mg via INTRAVENOUS

## 2013-11-14 MED ORDER — SODIUM CHLORIDE 0.9 % IV SOLN
Freq: Once | INTRAVENOUS | Status: AC
  Administered 2013-11-14: 12:00:00 via INTRAVENOUS

## 2013-11-14 MED ORDER — DIPHENHYDRAMINE HCL 50 MG/ML IJ SOLN
50.0000 mg | Freq: Once | INTRAMUSCULAR | Status: AC
  Administered 2013-11-14: 50 mg via INTRAVENOUS

## 2013-11-14 MED ORDER — ONDANSETRON 16 MG/50ML IVPB (CHCC)
16.0000 mg | Freq: Once | INTRAVENOUS | Status: AC
  Administered 2013-11-14: 16 mg via INTRAVENOUS

## 2013-11-14 NOTE — Telephone Encounter (Signed)
Per staff message and POF I have scheduled appts. Advised scheduler to move lab appts.  JMW

## 2013-11-14 NOTE — Telephone Encounter (Signed)
pt will see Dr.Hocrein's PA @ [facility] cardiology tomorrow,pt is aware of appt

## 2013-11-14 NOTE — Patient Instructions (Signed)
[facility] Discharge Instructions for Patients Receiving Chemotherapy  Today you received the following chemotherapy agents Taxol and Carboplatin.  To help prevent nausea and vomiting after your treatment, we encourage you to take your nausea medication.   If you develop nausea and vomiting that is not controlled by your nausea medication, call the clinic.   BELOW ARE SYMPTOMS THAT SHOULD BE REPORTED IMMEDIATELY:  *FEVER GREATER THAN 100.5 F  *CHILLS WITH OR WITHOUT FEVER  NAUSEA AND VOMITING THAT IS NOT CONTROLLED WITH YOUR NAUSEA MEDICATION  *UNUSUAL SHORTNESS OF BREATH  *UNUSUAL BRUISING OR BLEEDING  TENDERNESS IN MOUTH AND THROAT WITH OR WITHOUT PRESENCE OF ULCERS  *URINARY PROBLEMS  *BOWEL PROBLEMS  UNUSUAL RASH Items with * indicate a potential emergency and should be followed up as soon as possible.  Feel free to call the clinic you have any questions or concerns. The clinic phone number is [phone].    

## 2013-11-14 NOTE — Patient Instructions (Signed)
He'll be referred back to your cardiologist because of your low blood pressure to have your blood pressure medications reevaluated Continue with your course of concurrent chemoradiation as scheduled Followup in 2 weeks

## 2013-11-14 NOTE — Progress Notes (Addendum)
No images are attached to the encounter. No scans are attached to the encounter. No scans are attached to the encounter. [place] NOTE  Walker Kehr, MD [facility] [place] [address]  DIAGNOSIS: Non-small cell cancer of left lung   Primary site: Lung (Left)   Staging method: AJCC 7th Edition   Clinical free text: NSCLC   Clinical: Stage IIIA (T1b, N2, M0) signed by Curt Bears, MD on 10/21/2013  9:48 AM   Pathologic free text: Negative EGFR mutation, Negative ALK gene translocation.   Summary: Stage IIIA (T1b, N2, M0)  PRIOR THERAPY: None  CURRENT THERAPY: Concurrent chemoradiation with weekly carboplatin for an AUC of 2 and paclitaxel 45 mg per meter squared for a total of 6-7 weeks according to the final dose of radiation  DISEASE STAGE: Non-small cell cancer of left lung   Primary site: Lung (Left)   Staging method: AJCC 7th Edition   Clinical free text: NSCLC   Clinical: Stage IIIA (T1b, N2, M0) signed by Curt Bears, MD on 10/21/2013  9:48 AM   Pathologic free text: Negative EGFR mutation, Negative ALK gene translocation.   Summary: Stage IIIA (T1b, N2, M0)  CHEMOTHERAPY INTENT: Control/palliative  CURRENT # OF CHEMOTHERAPY CYCLES: 3  CURRENT ANTIEMETICS: Zofran, dexamethasone, Compazine  CURRENT SMOKING STATUS: Former smoker, quit 11/04/2003  ORAL CHEMOTHERAPY AND CONSENT: N./A.  CURRENT BISPHOSPHONATES USE: None  PAIN MANAGEMENT: Tramadol  NARCOTICS INDUCED CONSTIPATION: None  LIVING WILL AND CODE STATUS: ?   INTERVAL HISTORY: Cameron [name] 73 y.o. male returns for a regular symptom management visit for followup of his recently diagnosed stage IIIa (T1 B., N2, M0) poorly differentiated non-small cell lung cancer. He is currently undergoing a course of concurrent chemoradiation. He is scheduled to complete radiation on 12/21/2013. He currently is complaining of some fatigue. He  admits to not drinking as much fluid as he should. He drinks some tonic water and has been in this practice since he was diagnosed with some cardiac issues. His cardiologist is Dr. Percival Spanish. He states that he saw him last spring. He is currently on 3 and a hypertensive medications. He reports some episodes of hemoptysis coughing up "quarter-sized" fresh blood-type sputum this occurred 3 times on Friday and to a similar extent but smaller amounts on Sunday. He denied any other episodes. He does have some mild dizziness but has had no presyncopal or syncopal episodes. He does not have much of an appetite but is forcing himself to eat. He is otherwise tolerating his course of concurrent chemoradiation without difficulty.  MEDICAL HISTORY: Past Medical History  Diagnosis Date  . COPD (chronic obstructive pulmonary disease)   . Asthma   . Coronary artery disease     s/p stenting to the right coronary artery with subsequent CABG.  He has LIMA to the LAD,SVG to diagonal, SVG to obtuse marginal, and SVG to PDA,  . Hyperlipidemia   . Osteoarthritis   . GERD (gastroesophageal reflux disease)   . Low back pain   . Non-small cell carcinoma of left lung 10/2013 dx  . Hypertension   . History of kidney stones     x1  . 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 Lung cancer     ALLERGIES:  is allergic to pravastatin; rosuvastatin; zolpidem; and augmentin.  MEDICATIONS:  Current Outpatient Prescriptions  Medication Sig Dispense Refill  . albuterol (PROVENTIL HFA;VENTOLIN HFA) 108 (  90 BASE) MCG/ACT inhaler Inhale 2 puffs into the lungs every 6 (six) hours as needed for wheezing or shortness of breath.  1 Inhaler  0  . budesonide-formoterol (SYMBICORT) 160-4.5 MCG/ACT inhaler Inhale 2 puffs into the lungs 2 (two) times daily.      . Cholecalciferol (VITAMIN D-3) 1000 UNITS CAPS Take by mouth daily.      . clonazePAM (KLONOPIN) 0.5 MG tablet Take 0.5 mg by mouth at bedtime. As needed for  sleep      . diphenhydrAMINE (BENADRYL) 25 MG tablet Take 25 mg by mouth every 6 (six) hours as needed for allergies.       Marland Kitchen losartan (COZAAR) 50 MG tablet Take 50 mg by mouth 2 (two) times daily.      . metoprolol succinate (TOPROL-XL) 25 MG 24 hr tablet Take 37.5 mg by mouth every morning. Takes 1 and 1/2      . omeprazole (PRILOSEC) 20 MG capsule Take 20 mg by mouth daily.      . predniSONE (DELTASONE) 10 MG tablet Take 10 mg by mouth daily with breakfast.      . prochlorperazine (COMPAZINE) 10 MG tablet Take 1 tablet (10 mg total) by mouth every 6 (six) hours as needed for nausea or vomiting.  60 tablet  0  . promethazine-codeine (PHENERGAN WITH CODEINE) 6.25-10 MG/5ML syrup Take 5 mLs by mouth every 4 (four) hours as needed for cough.  180 mL  0  . Tamsulosin HCl (FLOMAX) 0.4 MG CAPS Take 0.4 mg by mouth daily.       . traMADol (ULTRAM) 50 MG tablet Take 50 mg by mouth every 6 (six) hours as needed for moderate pain.      Marland Kitchen triamcinolone cream (KENALOG) 0.5 % APPLY 3 TIMES A DAY TO RASH AS NEEDED  45 g  1  . amLODipine (NORVASC) 2.5 MG tablet Take 2.5 mg by mouth at bedtime.      Marland Kitchen aspirin 81 MG tablet Take 81 mg by mouth daily.      . hyaluronate sodium (RADIAPLEXRX) GEL Apply 1 application topically 2 (two) times daily.      . nitroGLYCERIN (NITROSTAT) 0.4 MG SL tablet Place 0.4 mg under the tongue every 5 (five) minutes as needed. x3 doses as needed for chest pain       No current facility-administered medications for this visit.    SURGICAL HISTORY:  Past Surgical History  Procedure Laterality Date  . Appendectomy    . Hemorrhoid surgery    . Video bronchoscopy Bilateral 09/21/2013    Procedure: VIDEO BRONCHOSCOPY WITHOUT FLUORO;  Surgeon: Tanda Rockers, MD;  Location: [facility];  Service: Cardiopulmonary;  Laterality: Bilateral;  . Coronary artery bypass graft      ,stent place prior-no problems now  . Endobronchial ultrasound Bilateral 10/17/2013    Procedure:  ENDOBRONCHIAL ULTRASOUND;  Surgeon: Collene Gobble, MD;  Location: [facility];  Service: Cardiopulmonary;  Laterality: Bilateral;    REVIEW OF SYSTEMS:  Constitutional: positive for anorexia and fatigue Eyes: negative Ears, nose, mouth, throat, and face: negative Respiratory: positive for hemoptysis Cardiovascular: negative Gastrointestinal: negative Genitourinary:negative Integument/breast: negative Hematologic/lymphatic: negative Musculoskeletal:negative Neurological: negative Behavioral/Psych: negative Endocrine: negative Allergic/Immunologic: negative   PHYSICAL EXAMINATION: General appearance: alert, cooperative, appears stated age and no distress Head: Normocephalic, without obvious abnormality, atraumatic Neck: no adenopathy, no carotid bruit, no JVD, supple, symmetrical, trachea midline and thyroid not enlarged, symmetric, no tenderness/mass/nodules Lymph nodes: Cervical, supraclavicular, and axillary nodes normal. Resp: clear to  auscultation bilaterally Back: symmetric, no curvature. ROM normal. No CVA tenderness. Cardio: regular rate and rhythm, S1, S2 normal, no murmur, click, rub or gallop GI: soft, non-tender; bowel sounds normal; no masses,  no organomegaly Extremities: extremities normal, atraumatic, no cyanosis or edema Neurologic: Alert and oriented X 3, normal strength and tone. Normal symmetric reflexes. Normal coordination and gait  ECOG PERFORMANCE STATUS: 1 - Symptomatic but completely ambulatory  Blood pressure 92/61, pulse 93, temperature 97.2 F (36.2 C), temperature source Oral, resp. rate 18, height 5' 8"  (1.727 m), weight 132 lb 3.2 oz (59.966 kg).  LABORATORY DATA: Lab Results  Component Value Date   WBC 5.5 11/14/2013   HGB 12.7* 11/14/2013   HCT 37.6* 11/14/2013   MCV 98.6* 11/14/2013   PLT 249 11/14/2013      Chemistry      Component Value Date/Time   NA 138 11/14/2013 0952   NA 134* 09/27/2013 1733   K 4.6 11/14/2013 0952   K 4.6  09/27/2013 1733   CL 101 09/27/2013 1733   CO2 23 11/14/2013 0952   CO2 31 09/27/2013 1733   BUN 24.2 11/14/2013 0952   BUN 24* 09/27/2013 1733   CREATININE 1.3 11/14/2013 0952   CREATININE 1.3 09/27/2013 1733      Component Value Date/Time   CALCIUM 9.5 11/14/2013 0952   CALCIUM 8.7 09/27/2013 1733   ALKPHOS 51 11/14/2013 0952   ALKPHOS 52 09/27/2013 1733   AST 14 11/14/2013 0952   AST 230* 09/27/2013 1733   ALT 19 11/14/2013 0952   ALT 96* 09/27/2013 1733   BILITOT 0.68 11/14/2013 0952   BILITOT 0.7 09/27/2013 1733       RADIOGRAPHIC STUDIES:  Dg Chest 2 View  10/24/2013   CLINICAL DATA:  Cough.  History of lung cancer.  EXAM: CHEST  2 VIEW  COMPARISON:  PET-CT 10/12/2013; chest radiograph 09/05/2013.  FINDINGS: Stable cardiac and mediastinal contours status post median sternotomy. No large consolidative pulmonary opacity. No definite pleural effusion or pneumothorax. Extensive emphysematous change. Re- demonstrated 2.5 cm mass within the superior segment left lower lobe.  IMPRESSION: Re- demonstrated 2.5 cm mass within the superior segment left lower lobe.  No evidence for superimposed acute cardiopulmonary process.   Electronically Signed   By: Lovey Newcomer M.D.   On: 10/24/2013 16:36   Mr Jeri Cos WE Contrast  10/25/2013   CLINICAL DATA:  Non-small-cell lung cancer staging.  EXAM: MRI HEAD WITHOUT AND WITH CONTRAST  TECHNIQUE: Multiplanar, multiecho pulse sequences of the brain and surrounding structures were obtained without and with intravenous contrast.  CONTRAST:  58m MULTIHANCE GADOBENATE DIMEGLUMINE 529 MG/ML IV SOLN  COMPARISON:  None.  FINDINGS: Two punctate foci of susceptibility artifact are present in the white matter of the posterior right frontal and parietal lobes and may reflect remote microhemorrhage. There are multiple small foci of T2 hyperintensity within the subcortical and deep cerebral white matter and pons, nonspecific but compatible with mild chronic small vessel  ischemic disease. Ventricles and sulci are within normal limits for age. There is no evidence of acute infarct, mass, midline shift, or extra-axial collection. There is no abnormal enhancement. Orbits and visualized paranasal sinuses and mastoid air cells are unremarkable. Major intracranial vascular flow voids are unremarkable.  IMPRESSION: No evidence of intracranial metastatic disease.   Electronically Signed   By: ALogan Bores  On: 10/25/2013 14:36     ASSESSMENT/PLAN: The patient is a very pleasant 73year old Caucasian male recently diagnosed with stage  IIIa (T1 B., N2, M0) poorly differentiated non-small cell lung cancer, presenting with left lower lobe pulmonary nodule and ipsilateral as well as subcarinal mediastinal lymphadenopathy diagnosed in December 2014. The subtype was identified secondary to a nonspecific specific immunostains. He is currently undergoing a course of concurrent chemoradiation in other than some fatigue and decreased appetite is tolerating this course of treatment without significant difficulty. He is hypotensive today the blood pressure ranging from 83/60 pulse of 82 and 92/61 with a pulse of 93. We'll refer him back to his cardiologist as soon as possible for reevaluation of his antihypertensive medications as they may need to be significantly reduced or discontinued. Patient was discussed with and also seen by Dr. Julien Nordmann. He will continue with his course of concurrent chemoradiation return in 2 weeks for another symptom management visit.     Wynetta Emery, Marlean Mortell E, PA-C     All questions were answered. The patient knows to call the clinic with any problems, questions or concerns. We can certainly see the patient much sooner if necessary.  Addendum:  Hematology/Oncology Attending: I had a face-to-face encounter with the patient. I recommended his care plan. He is a very pleasant 73 years old white male recently diagnosed with a stage IIIa non-small cell lung cancer  currently undergoing a course of concurrent chemoradiation with weekly carboplatin and paclitaxel is status post 2 cycles. He is tolerating his treatment fairly well with no significant adverse effects, except for mild fatigue and lack of appetite. I recommended for the patient to continue his current treatment with concurrent chemoradiation as scheduled. He would come back for followup visit in 2 weeks for reevaluation. First hypotension he was advised to consult with his cardiologist to adjust his antihypertensive medications. He was advised to call immediately if he has any concerning symptoms in the interval. Eilleen Kempf., MD 11/15/2013

## 2013-11-15 ENCOUNTER — Ambulatory Visit (INDEPENDENT_AMBULATORY_CARE_PROVIDER_SITE_OTHER): Payer: Medicare Other | Admitting: Nurse Practitioner

## 2013-11-15 ENCOUNTER — Encounter: Payer: Self-pay | Admitting: Nurse Practitioner

## 2013-11-15 ENCOUNTER — Ambulatory Visit
Admission: RE | Admit: 2013-11-15 | Discharge: 2013-11-15 | Disposition: A | Discharge status: Home / Self Care | Payer: Medicare Other | Source: Ambulatory Visit | Attending: Radiation Oncology | Admitting: Radiation Oncology

## 2013-11-15 VITALS — BP 134/80 | HR 84 | Ht 68.0 in | Wt 134.2 lb

## 2013-11-15 DIAGNOSIS — E785 Hyperlipidemia, unspecified: Secondary | ICD-10-CM

## 2013-11-15 DIAGNOSIS — I10 Essential (primary) hypertension: Secondary | ICD-10-CM

## 2013-11-15 DIAGNOSIS — C3490 Malignant neoplasm of unspecified part of unspecified bronchus or lung: Secondary | ICD-10-CM

## 2013-11-15 DIAGNOSIS — C3492 Malignant neoplasm of unspecified part of left bronchus or lung: Secondary | ICD-10-CM

## 2013-11-15 DIAGNOSIS — I2510 Atherosclerotic heart disease of native coronary artery without angina pectoris: Secondary | ICD-10-CM

## 2013-11-15 MED ORDER — LOSARTAN POTASSIUM 25 MG PO TABS: 25.0000 mg | ORAL_TABLET | Freq: Two times a day (BID) | ORAL | Status: DC

## 2013-11-15 NOTE — Progress Notes (Signed)
Patient Name: Cameron [name] Date of Encounter: 11/15/2013  Primary Care Provider:  Walker Kehr, MD Primary Cardiologist: J. Hochrein, MD   Patient Profile   73 year old male with history of coronary artery disease status post prior coronary artery bypass grafting who was recently diagnosed with left-sided non-small cell lung cancer who presents today for evaluation of his blood pressure medication regimen secondary to low blood pressures.  Problem List   Past Medical History  Diagnosis Date  . COPD (chronic obstructive pulmonary disease)   . Asthma   . Coronary artery disease     a. s/p stenting to the RCA with subsequent CABGx4 (01/2005)->LIMA-LAD,SVG-diagonal, SVG-om, and SVG-PDA;  b. 03/2012 MV: EF 67%, no isch/infarct.  . Hyperlipidemia   . Osteoarthritis   . GERD (gastroesophageal reflux disease)   . Low back pain   . Non-small cell carcinoma of left lung 10/2013 dx    a. chemoradiation  . Hypertension   . Nephrolithiasis     x1  . 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 Appendectomy    . Hemorrhoid surgery    . Video bronchoscopy Bilateral 09/21/2013    Procedure: VIDEO BRONCHOSCOPY WITHOUT FLUORO;  Surgeon: Tanda Rockers, MD;  Location: [facility];  Service: Cardiopulmonary;  Laterality: Bilateral;  . Coronary artery bypass graft      ,stent place prior-no problems now  . Endobronchial ultrasound Bilateral 10/17/2013    Procedure: ENDOBRONCHIAL ULTRASOUND;  Surgeon: Collene Gobble, MD;  Location: [facility];  Service: Cardiopulmonary;  Laterality: Bilateral;    Allergies  Allergies  Allergen Reactions  . Pravastatin     CK>2000, muscle weakness  . Rosuvastatin Other (See Comments)    REACTION:muscle aches  . Zolpidem Other (See Comments)    confusion  . Augmentin [Amoxicillin-Pot Clavulanate] Rash    HPI  73 year old male with prior history of coronary artery disease  status post coronary artery bypass grafting in March 2006.  He had a low risk Myoview in May of 2013.  In November of this year, he began to experience cough and bronchitic type symptoms with intermittent hemoptysis.  He he has subsequently undergone extensive evaluation and has been diagnosed with non-small cell lung cancer on the left.  His been seen by pulmonology and oncology and is currently undergoing chemoradiation.  In the setting of this new diagnosis and therapies, he has been losing weight and also his appetite has not been as good as previous.  He has been noticing that his blood pressures been running in the one teens and as result has recently been holding his amlodipine in the evenings.  He was seen in oncology clinic yesterday and his pressures were in the 80s to 90s with associated lightheadedness.  He was advised to followup with [place] today.  He did not take his amlodipine last night and only took half of his losartan.  He took a full 37.5 mg of metoprolol this morning.  He is currently asymptomatic and his blood pressure is 134/80.  Despite his new diagnosis, he has not been experiencing significant dyspnea exertion.  He has had some fatigue in the setting of chemotherapy and radiation but overall feels well.  He has not had any chest pain.  He denies PND, orthopnea, syncope, edema, or early satiety.  He tries to ride a stationary bike for 10 or more minutes each day.  In November, when he was diagnosed with bronchitis  and placed on Augmentin therapy, she developed a rash and severe muscle aches.  His CK was found to be elevated and his statin was discontinued.  Muscle aches have resolved though remnants of his rash persisted.  Home Medications  Prior to Admission medications   Medication Sig Start Date End Date Taking? Authorizing Provider  albuterol (PROVENTIL HFA;VENTOLIN HFA) 108 (90 BASE) MCG/ACT inhaler Inhale 2 puffs into the lungs every 6 (six) hours as needed for wheezing or  shortness of breath. 10/24/13  Yes Rowe Clack, MD  budesonide-formoterol [facility]) 160-4.5 MCG/ACT inhaler Inhale 2 puffs into the lungs 2 (two) times daily.   Yes Historical Provider, MD  Cholecalciferol (VITAMIN D-3) 1000 UNITS CAPS Take by mouth daily.   Yes Historical Provider, MD  clonazePAM (KLONOPIN) 0.5 MG tablet Take 0.5 mg by mouth at bedtime. As needed for sleep   Yes Historical Provider, MD  diphenhydrAMINE (BENADRYL) 25 MG tablet Take 25 mg by mouth every 6 (six) hours as needed for allergies.    Yes Historical Provider, MD  hyaluronate sodium (RADIAPLEXRX) GEL Apply 1 application topically 2 (two) times daily.   Yes Historical Provider, MD  losartan (COZAAR) 25 MG tablet Take 1 tablet (25 mg total) by mouth 2 (two) times daily. 11/15/13  Yes Rogelia Mire, NP  metoprolol succinate (TOPROL-XL) 25 MG 24 hr tablet Take 37.5 mg by mouth every morning. Takes 1 and 1/2   Yes Historical Provider, MD  nitroGLYCERIN (NITROSTAT) 0.4 MG SL tablet Place 0.4 mg under the tongue every 5 (five) minutes as needed. x3 doses as needed for chest pain 03/15/12  Yes Minus Breeding, MD  omeprazole (PRILOSEC) 20 MG capsule Take 20 mg by mouth daily.   Yes Historical Provider, MD  predniSONE (DELTASONE) 10 MG tablet Take 10 mg by mouth daily with breakfast.   Yes Historical Provider, MD  prochlorperazine (COMPAZINE) 10 MG tablet Take 1 tablet (10 mg total) by mouth every 6 (six) hours as needed for nausea or vomiting. 10/21/13  Yes Curt Bears, MD  promethazine-codeine (PHENERGAN WITH CODEINE) 6.25-10 MG/5ML syrup Take 5 mLs by mouth every 4 (four) hours as needed for cough. 10/24/13  Yes Rowe Clack, MD  Tamsulosin HCl (FLOMAX) 0.4 MG CAPS Take 0.4 mg by mouth daily.    Yes Historical Provider, MD  traMADol (ULTRAM) 50 MG tablet Take 50 mg by mouth every 6 (six) hours as needed for moderate pain.   Yes Historical Provider, MD  triamcinolone cream (KENALOG) 0.5 % APPLY 3 TIMES A DAY TO  RASH AS NEEDED 11/06/13  Yes Cassandria Anger, MD  aspirin 81 MG tablet Take 81 mg by mouth daily.    Historical Provider, MD    Review of Systems  No chest pain or significant doe.  He had a small amt of hemoptysis last Friday.  He was LH yesterday in the setting of low blood pressures.  All other systems reviewed and are otherwise negative except as noted above.  Physical Exam  Blood pressure 134/80, pulse 84, height 5\' 8"  (1.727 m), weight 134 lb 3.2 oz (60.873 kg).  General: Pleasant, NAD Psych: Normal affect. Neuro: Alert and oriented X 3. Moves all extremities spontaneously. HEENT: Normal  Neck: Supple without bruits or JVD. Lungs:  Resp regular and unlabored, very diminished breath sounds on the left.  CTA on the right. Heart: RRR, very distant. Abdomen: Soft, non-tender, non-distended, BS + x 4.  Extremities: No clubbing, cyanosis or edema. DP/PT/Radials 2+ and equal  bilaterally.  Accessory Clinical Findings  ECG - regular sinus rhythm, 75, infralateral T wave flattening.  No acute ST or T changes.  Assessment & Plan  1.  Hypertension: The setting of diagnosis and treatment of lung cancer, patient been losing weight and may not be maintaining proper nutrition and hydration.  His blood pressures have been running lower than yesterday he was in the 80s to 90s and feeling lightheaded.  Patient typically monitors his blood pressure very diligently and has been adjusting his home dosing.  He has held his amlodipine over the past 2 days and this morning he cut his losartan half.  We discussed it and I agree with these changes and therefore we will formally discontinue amlodipine, change his losartan 25 mg b.i.d., and continue metoprolol at its current dose of 37.5 mg twice a day.  We will continue to follow his blood pressure at home and call back to report any need for additional changes.  2.  Coronary artery disease: Status post coronary artery bypass graft in 2006 with a normal  Myoview in 2013.  Is not having any chest pain or significant dyspnea on exertion.  Continue aspirin, beta blocker.  His statin was discontinued in the setting of CK elevation while on Augmentin therapy in November.  Given ongoing chemoradiation, will defer reinitiation of statin therapy at this time.  3.  Left-sided small cell lung cancer:  Chemoradiation per heme/onc.  4.  HL:  Currently off statin as above.  5.  Dispo:  Pt will cont to follow bp @ home and f/u with Dr. Percival Spanish in 4-6 wks.    Murray Hodgkins, NP 11/15/2013, 1:10 PM

## 2013-11-15 NOTE — Patient Instructions (Addendum)
Your physician has recommended you make the following change in your medication:  1) STOP Amlodipine 2) REDUCE Losartan to 25mg  twice daily  Continue taking all other medications as prescribed.  Your physician recommends that you schedule a follow-up appointment in: 2-3 months with Dr.Hochrein

## 2013-11-16 ENCOUNTER — Ambulatory Visit
Admission: RE | Admit: 2013-11-16 | Discharge: 2013-11-16 | Disposition: A | Discharge status: Home / Self Care | Payer: Medicare Other | Source: Ambulatory Visit | Attending: Radiation Oncology | Admitting: Radiation Oncology

## 2013-11-16 ENCOUNTER — Telehealth: Payer: Self-pay | Admitting: Internal Medicine

## 2013-11-16 ENCOUNTER — Encounter: Payer: Self-pay | Admitting: Radiation Oncology

## 2013-11-16 VITALS — BP 96/62 | HR 90 | Temp 97.4°F | Ht 68.0 in | Wt 134.3 lb

## 2013-11-16 DIAGNOSIS — C3492 Malignant neoplasm of unspecified part of left bronchus or lung: Secondary | ICD-10-CM

## 2013-11-16 NOTE — Progress Notes (Signed)
Cameron [name] here before treatment today because he felt dizzy after walking from the lobby to the treatment area.  Orthostatic vitals done: bp sitting 120/67, hr 94, standing bp 96/62, hr 90.  He is currently taking losartan 25 mg twice a day (decreased from 50 mg twice a day yesterday by Murray Hodgkins NP), metoprolol 25 mg in the am and 12.5 mg in the pm and flomax 0.4 mg daily.  He had chemotherapy Monday and said his bp was 80/60.  His weight is steady at 134.3.  He reports a poor appetite.  He denies a sore throat or pain.

## 2013-11-16 NOTE — Telephone Encounter (Signed)
Talked to pt's wife and she is aware of all appts all the way to ens of january 2015

## 2013-11-16 NOTE — Progress Notes (Signed)
  Radiation Oncology         [phone] ________________________________  Name: Cameron [name] MRN: [id number]  Date: 11/16/2013  DOB: October 07, 1941  Weekly Radiation Therapy Management  Current Dose: 16 Gy     Planned Dose:  66 Gy  Narrative . . . . . . . . The patient presents for routine under treatment assessment.                                   The patient is without complaint.                                 Set-up films were reviewed.                                 The chart was checked. Physical Findings. . .  height is 5\' 8"  (1.727 m) and weight is 134 lb 4.8 oz (60.918 kg). His temperature is 97.4 F (36.3 C). His blood pressure is 96/62 and his pulse is 90. His oxygen saturation is 100%. . Weight essentially stable.  No significant changes. Impression . . . . . . . The patient is tolerating radiation. Plan . . . . . . . . . . . . Continue treatment as planned.  ________________________________  Sheral Apley. Tammi Klippel, M.D.

## 2013-11-17 ENCOUNTER — Ambulatory Visit
Admission: RE | Admit: 2013-11-17 | Discharge: 2013-11-17 | Disposition: A | Discharge status: Home / Self Care | Payer: Medicare Other | Source: Ambulatory Visit | Attending: Radiation Oncology | Admitting: Radiation Oncology

## 2013-11-17 ENCOUNTER — Encounter: Payer: Self-pay | Admitting: Radiation Oncology

## 2013-11-18 ENCOUNTER — Ambulatory Visit
Admission: RE | Admit: 2013-11-18 | Discharge: 2013-11-18 | Disposition: A | Discharge status: Home / Self Care | Payer: Medicare Other | Source: Ambulatory Visit | Attending: Radiation Oncology | Admitting: Radiation Oncology

## 2013-11-21 ENCOUNTER — Ambulatory Visit (HOSPITAL_BASED_OUTPATIENT_CLINIC_OR_DEPARTMENT_OTHER): Payer: Medicare Other

## 2013-11-21 ENCOUNTER — Ambulatory Visit
Admission: RE | Admit: 2013-11-21 | Discharge: 2013-11-21 | Disposition: A | Discharge status: Home / Self Care | Payer: Medicare Other | Source: Ambulatory Visit | Attending: Radiation Oncology | Admitting: Radiation Oncology

## 2013-11-21 ENCOUNTER — Other Ambulatory Visit (HOSPITAL_BASED_OUTPATIENT_CLINIC_OR_DEPARTMENT_OTHER): Payer: Medicare Other

## 2013-11-21 ENCOUNTER — Telehealth: Payer: Self-pay | Admitting: Internal Medicine

## 2013-11-21 VITALS — BP 131/86 | HR 92 | Temp 97.9°F | Resp 18

## 2013-11-21 DIAGNOSIS — C3430 Malignant neoplasm of lower lobe, unspecified bronchus or lung: Secondary | ICD-10-CM

## 2013-11-21 DIAGNOSIS — C3492 Malignant neoplasm of unspecified part of left bronchus or lung: Secondary | ICD-10-CM

## 2013-11-21 DIAGNOSIS — Z5111 Encounter for antineoplastic chemotherapy: Secondary | ICD-10-CM

## 2013-11-21 LAB — COMPREHENSIVE METABOLIC PANEL (CC13)
ALT: 15 U/L (ref 0–55)
ANION GAP: 11 meq/L (ref 3–11)
AST: 14 U/L (ref 5–34)
Albumin: 3 g/dL — ABNORMAL LOW (ref 3.5–5.0)
Alkaline Phosphatase: 53 U/L (ref 40–150)
BILIRUBIN TOTAL: 0.49 mg/dL (ref 0.20–1.20)
BUN: 16.4 mg/dL (ref 7.0–26.0)
CO2: 25 mEq/L (ref 22–29)
Calcium: 9.3 mg/dL (ref 8.4–10.4)
Chloride: 103 mEq/L (ref 98–109)
Creatinine: 1.1 mg/dL (ref 0.7–1.3)
GLUCOSE: 154 mg/dL — AB (ref 70–140)
Potassium: 4.1 mEq/L (ref 3.5–5.1)
Sodium: 138 mEq/L (ref 136–145)
Total Protein: 6.2 g/dL — ABNORMAL LOW (ref 6.4–8.3)

## 2013-11-21 LAB — CBC WITH DIFFERENTIAL/PLATELET
BASO%: 0.2 % (ref 0.0–2.0)
Basophils Absolute: 0 10*3/uL (ref 0.0–0.1)
EOS%: 0.3 % (ref 0.0–7.0)
Eosinophils Absolute: 0 10*3/uL (ref 0.0–0.5)
HCT: 36 % — ABNORMAL LOW (ref 38.4–49.9)
HGB: 12.2 g/dL — ABNORMAL LOW (ref 13.0–17.1)
LYMPH%: 3.4 % — AB (ref 14.0–49.0)
MCH: 33.4 pg (ref 27.2–33.4)
MCHC: 33.8 g/dL (ref 32.0–36.0)
MCV: 98.9 fL — ABNORMAL HIGH (ref 79.3–98.0)
MONO#: 0.4 10*3/uL (ref 0.1–0.9)
MONO%: 6.9 % (ref 0.0–14.0)
NEUT#: 5.1 10*3/uL (ref 1.5–6.5)
NEUT%: 89.2 % — ABNORMAL HIGH (ref 39.0–75.0)
Platelets: 229 10*3/uL (ref 140–400)
RBC: 3.64 10*6/uL — ABNORMAL LOW (ref 4.20–5.82)
RDW: 13.2 % (ref 11.0–14.6)
WBC: 5.7 10*3/uL (ref 4.0–10.3)
lymph#: 0.2 10*3/uL — ABNORMAL LOW (ref 0.9–3.3)

## 2013-11-21 MED ORDER — FAMOTIDINE IN NACL 20-0.9 MG/50ML-% IV SOLN
20.0000 mg | Freq: Once | INTRAVENOUS | Status: AC
  Administered 2013-11-21: 20 mg via INTRAVENOUS

## 2013-11-21 MED ORDER — DEXAMETHASONE SODIUM PHOSPHATE 20 MG/5ML IJ SOLN
INTRAMUSCULAR | Status: AC
  Filled 2013-11-21: qty 5

## 2013-11-21 MED ORDER — PACLITAXEL CHEMO INJECTION 300 MG/50ML: 45.0000 mg/m2 | Freq: Once | INTRAVENOUS | Status: DC

## 2013-11-21 MED ORDER — CARBOPLATIN CHEMO INJECTION 450 MG/45ML
149.6000 mg | Freq: Once | INTRAVENOUS | Status: AC
  Administered 2013-11-21: 150 mg via INTRAVENOUS
  Filled 2013-11-21: qty 15

## 2013-11-21 MED ORDER — FAMOTIDINE IN NACL 20-0.9 MG/50ML-% IV SOLN
INTRAVENOUS | Status: AC
  Filled 2013-11-21: qty 50

## 2013-11-21 MED ORDER — ONDANSETRON 16 MG/50ML IVPB (CHCC)
16.0000 mg | Freq: Once | INTRAVENOUS | Status: AC
  Administered 2013-11-21: 16 mg via INTRAVENOUS

## 2013-11-21 MED ORDER — DEXAMETHASONE SODIUM PHOSPHATE 20 MG/5ML IJ SOLN
20.0000 mg | Freq: Once | INTRAMUSCULAR | Status: AC
  Administered 2013-11-21: 20 mg via INTRAVENOUS

## 2013-11-21 MED ORDER — ONDANSETRON 16 MG/50ML IVPB (CHCC)
INTRAVENOUS | Status: AC
  Filled 2013-11-21: qty 16

## 2013-11-21 MED ORDER — DIPHENHYDRAMINE HCL 50 MG/ML IJ SOLN
INTRAMUSCULAR | Status: AC
  Filled 2013-11-21: qty 1

## 2013-11-21 MED ORDER — SODIUM CHLORIDE 0.9 % IV SOLN
45.0000 mg/m2 | Freq: Once | INTRAVENOUS | Status: AC
  Administered 2013-11-21: 78 mg via INTRAVENOUS
  Filled 2013-11-21: qty 13

## 2013-11-21 MED ORDER — SODIUM CHLORIDE 0.9 % IV SOLN
Freq: Once | INTRAVENOUS | Status: AC
  Administered 2013-11-21: 13:00:00 via INTRAVENOUS

## 2013-11-21 MED ORDER — DIPHENHYDRAMINE HCL 50 MG/ML IJ SOLN
50.0000 mg | Freq: Once | INTRAMUSCULAR | Status: AC
  Administered 2013-11-21: 50 mg via INTRAVENOUS

## 2013-11-21 NOTE — Patient Instructions (Signed)
[facility] Discharge Instructions for Patients Receiving Chemotherapy  Today you received the following chemotherapy agents Taxol and Carboplatin.  To help prevent nausea and vomiting after your treatment, we encourage you to take your nausea medication Compazine 10 mg every 6 hours as needed.   If you develop nausea and vomiting that is not controlled by your nausea medication, call the clinic.   BELOW ARE SYMPTOMS THAT SHOULD BE REPORTED IMMEDIATELY:  *FEVER GREATER THAN 100.5 F  *CHILLS WITH OR WITHOUT FEVER  NAUSEA AND VOMITING THAT IS NOT CONTROLLED WITH YOUR NAUSEA MEDICATION  *UNUSUAL SHORTNESS OF BREATH  *UNUSUAL BRUISING OR BLEEDING  TENDERNESS IN MOUTH AND THROAT WITH OR WITHOUT PRESENCE OF ULCERS  *URINARY PROBLEMS  *BOWEL PROBLEMS  UNUSUAL RASH Items with * indicate a potential emergency and should be followed up as soon as possible.  Feel free to call the clinic you have any questions or concerns. The clinic phone number is [phone].

## 2013-11-21 NOTE — Telephone Encounter (Signed)
Relevant patient education assigned to patient using Emmi. ° °

## 2013-11-22 ENCOUNTER — Ambulatory Visit
Admission: RE | Admit: 2013-11-22 | Discharge: 2013-11-22 | Disposition: A | Discharge status: Home / Self Care | Payer: Medicare Other | Source: Ambulatory Visit | Attending: Radiation Oncology | Admitting: Radiation Oncology

## 2013-11-23 ENCOUNTER — Ambulatory Visit
Admission: RE | Admit: 2013-11-23 | Discharge: 2013-11-23 | Disposition: A | Discharge status: Home / Self Care | Payer: Medicare Other | Source: Ambulatory Visit | Attending: Radiation Oncology | Admitting: Radiation Oncology

## 2013-11-24 ENCOUNTER — Ambulatory Visit
Admission: RE | Admit: 2013-11-24 | Discharge: 2013-11-24 | Disposition: A | Discharge status: Home / Self Care | Payer: Medicare Other | Source: Ambulatory Visit | Attending: Radiation Oncology | Admitting: Radiation Oncology

## 2013-11-25 ENCOUNTER — Encounter: Payer: Self-pay | Admitting: Radiation Oncology

## 2013-11-25 ENCOUNTER — Ambulatory Visit
Admission: RE | Admit: 2013-11-25 | Discharge: 2013-11-25 | Disposition: A | Discharge status: Home / Self Care | Payer: Medicare Other | Source: Ambulatory Visit | Attending: Radiation Oncology | Admitting: Radiation Oncology

## 2013-11-25 VITALS — BP 141/83 | HR 81 | Temp 97.6°F | Resp 20 | Wt 131.4 lb

## 2013-11-25 DIAGNOSIS — C3492 Malignant neoplasm of unspecified part of left bronchus or lung: Secondary | ICD-10-CM

## 2013-11-25 DIAGNOSIS — K209 Esophagitis, unspecified without bleeding: Secondary | ICD-10-CM

## 2013-11-25 MED ORDER — SUCRALFATE 1 G PO TABS
1.0000 g | ORAL_TABLET | Freq: Three times a day (TID) | ORAL | Status: AC
Start: 2013-11-25 — End: ?

## 2013-11-25 NOTE — Progress Notes (Signed)
Weekly rad txs  Chest 15/33 completed, 100% room air, ,  Coughs up white phelgm , eating soft foods, after  he swallows foods  It feels like gas , and pressure, there, takes prilosec daily, feels that isn't helping, belching, ate scrambeled eggs, cheese toast and tonic water for breakfast, drinks 1 boost daily , 2 meals daily Weak,5:46 PM

## 2013-11-25 NOTE — Progress Notes (Signed)
  Radiation Oncology         [phone] ________________________________  Name: Cameron [name] MRN: [id number]  Date: 11/25/2013  DOB: 06-Nov-1940  Weekly Radiation Therapy Management  Current Dose: 30 Gy     Planned Dose:  66 Gy  Narrative . . . . . . . . The patient presents for routine under treatment assessment.                                   The patient is without complaint.                                 Set-up films were reviewed.                                 The chart was checked. Physical Findings. . .  weight is 131 lb 6.4 oz (59.603 kg). His oral temperature is 97.6 F (36.4 C). His blood pressure is 141/83 and his pulse is 81. His respiration is 20. . Weight essentially stable.  No significant changes. Impression . . . . . . . The patient is tolerating radiation. Plan . . . . . . . . . . . . Continue treatment as planned.  ________________________________  Sheral Apley. Tammi Klippel, M.D.

## 2013-11-28 ENCOUNTER — Other Ambulatory Visit: Payer: Self-pay | Admitting: *Deleted

## 2013-11-28 ENCOUNTER — Ambulatory Visit (HOSPITAL_BASED_OUTPATIENT_CLINIC_OR_DEPARTMENT_OTHER): Payer: Medicare Other

## 2013-11-28 ENCOUNTER — Other Ambulatory Visit: Payer: Medicare Other

## 2013-11-28 ENCOUNTER — Ambulatory Visit (HOSPITAL_BASED_OUTPATIENT_CLINIC_OR_DEPARTMENT_OTHER): Payer: Medicare Other | Admitting: Physician Assistant

## 2013-11-28 ENCOUNTER — Other Ambulatory Visit (HOSPITAL_BASED_OUTPATIENT_CLINIC_OR_DEPARTMENT_OTHER): Payer: Medicare Other

## 2013-11-28 ENCOUNTER — Ambulatory Visit
Admission: RE | Admit: 2013-11-28 | Discharge: 2013-11-28 | Disposition: A | Discharge status: Home / Self Care | Payer: Medicare Other | Source: Ambulatory Visit | Attending: Radiation Oncology | Admitting: Radiation Oncology

## 2013-11-28 ENCOUNTER — Telehealth: Payer: Self-pay | Admitting: Internal Medicine

## 2013-11-28 ENCOUNTER — Encounter: Payer: Self-pay | Admitting: Physician Assistant

## 2013-11-28 VITALS — BP 96/69 | HR 94 | Temp 97.7°F | Resp 18 | Ht 68.0 in | Wt 130.8 lb

## 2013-11-28 DIAGNOSIS — C3430 Malignant neoplasm of lower lobe, unspecified bronchus or lung: Secondary | ICD-10-CM

## 2013-11-28 DIAGNOSIS — C3492 Malignant neoplasm of unspecified part of left bronchus or lung: Secondary | ICD-10-CM

## 2013-11-28 DIAGNOSIS — I959 Hypotension, unspecified: Secondary | ICD-10-CM

## 2013-11-28 DIAGNOSIS — Z5111 Encounter for antineoplastic chemotherapy: Secondary | ICD-10-CM

## 2013-11-28 LAB — CBC WITH DIFFERENTIAL/PLATELET
BASO%: 0.3 % (ref 0.0–2.0)
BASOS ABS: 0 10*3/uL (ref 0.0–0.1)
EOS%: 0.8 % (ref 0.0–7.0)
Eosinophils Absolute: 0 10*3/uL (ref 0.0–0.5)
HEMATOCRIT: 31.2 % — AB (ref 38.4–49.9)
HEMOGLOBIN: 10.7 g/dL — AB (ref 13.0–17.1)
LYMPH#: 0.4 10*3/uL — AB (ref 0.9–3.3)
LYMPH%: 12 % — ABNORMAL LOW (ref 14.0–49.0)
MCH: 32.6 pg (ref 27.2–33.4)
MCHC: 34.3 g/dL (ref 32.0–36.0)
MCV: 95.1 fL (ref 79.3–98.0)
MONO#: 0.4 10*3/uL (ref 0.1–0.9)
MONO%: 9.8 % (ref 0.0–14.0)
NEUT%: 77.1 % — ABNORMAL HIGH (ref 39.0–75.0)
NEUTROS ABS: 2.8 10*3/uL (ref 1.5–6.5)
Platelets: 128 10*3/uL — ABNORMAL LOW (ref 140–400)
RBC: 3.28 10*6/uL — ABNORMAL LOW (ref 4.20–5.82)
RDW: 13.6 % (ref 11.0–14.6)
WBC: 3.7 10*3/uL — AB (ref 4.0–10.3)
nRBC: 1 % — ABNORMAL HIGH (ref 0–0)

## 2013-11-28 LAB — COMPREHENSIVE METABOLIC PANEL (CC13)
ALBUMIN: 2.8 g/dL — AB (ref 3.5–5.0)
ALT: 15 U/L (ref 0–55)
AST: 13 U/L (ref 5–34)
Alkaline Phosphatase: 56 U/L (ref 40–150)
Anion Gap: 8 mEq/L (ref 3–11)
BUN: 16.8 mg/dL (ref 7.0–26.0)
CALCIUM: 9.2 mg/dL (ref 8.4–10.4)
CHLORIDE: 100 meq/L (ref 98–109)
CO2: 28 mEq/L (ref 22–29)
Creatinine: 1.1 mg/dL (ref 0.7–1.3)
Glucose: 122 mg/dl (ref 70–140)
POTASSIUM: 4.5 meq/L (ref 3.5–5.1)
SODIUM: 136 meq/L (ref 136–145)
TOTAL PROTEIN: 5.8 g/dL — AB (ref 6.4–8.3)
Total Bilirubin: 0.51 mg/dL (ref 0.20–1.20)

## 2013-11-28 MED ORDER — SODIUM CHLORIDE 0.9 % IV SOLN
45.0000 mg/m2 | Freq: Once | INTRAVENOUS | Status: AC
  Administered 2013-11-28: 78 mg via INTRAVENOUS
  Filled 2013-11-28: qty 13

## 2013-11-28 MED ORDER — DEXAMETHASONE SODIUM PHOSPHATE 20 MG/5ML IJ SOLN
INTRAMUSCULAR | Status: AC
  Filled 2013-11-28: qty 5

## 2013-11-28 MED ORDER — ONDANSETRON 16 MG/50ML IVPB (CHCC)
16.0000 mg | Freq: Once | INTRAVENOUS | Status: AC
  Administered 2013-11-28: 16 mg via INTRAVENOUS

## 2013-11-28 MED ORDER — DEXAMETHASONE SODIUM PHOSPHATE 20 MG/5ML IJ SOLN
20.0000 mg | Freq: Once | INTRAMUSCULAR | Status: AC
  Administered 2013-11-28: 20 mg via INTRAVENOUS

## 2013-11-28 MED ORDER — FAMOTIDINE IN NACL 20-0.9 MG/50ML-% IV SOLN
INTRAVENOUS | Status: AC
  Filled 2013-11-28: qty 50

## 2013-11-28 MED ORDER — DIPHENHYDRAMINE HCL 50 MG/ML IJ SOLN
INTRAMUSCULAR | Status: AC
  Filled 2013-11-28: qty 1

## 2013-11-28 MED ORDER — DIPHENHYDRAMINE HCL 50 MG/ML IJ SOLN
50.0000 mg | Freq: Once | INTRAMUSCULAR | Status: AC
  Administered 2013-11-28: 50 mg via INTRAVENOUS

## 2013-11-28 MED ORDER — FAMOTIDINE IN NACL 20-0.9 MG/50ML-% IV SOLN
20.0000 mg | Freq: Once | INTRAVENOUS | Status: AC
  Administered 2013-11-28: 20 mg via INTRAVENOUS

## 2013-11-28 MED ORDER — SODIUM CHLORIDE 0.9 % IV SOLN
Freq: Once | INTRAVENOUS | Status: AC
  Administered 2013-11-28: 15:00:00 via INTRAVENOUS

## 2013-11-28 MED ORDER — ONDANSETRON 16 MG/50ML IVPB (CHCC)
INTRAVENOUS | Status: AC
  Filled 2013-11-28: qty 16

## 2013-11-28 MED ORDER — SODIUM CHLORIDE 0.9 % IV SOLN
149.6000 mg | Freq: Once | INTRAVENOUS | Status: AC
  Administered 2013-11-28: 150 mg via INTRAVENOUS
  Filled 2013-11-28: qty 15

## 2013-11-28 NOTE — Patient Instructions (Signed)
[facility] Discharge Instructions for Patients Receiving Chemotherapy  Today you received the following chemotherapy agents Taxol/Carboplatin.  To help prevent nausea and vomiting after your treatment, we encourage you to take your nausea medication as prescribed.   If you develop nausea and vomiting that is not controlled by your nausea medication, call the clinic.   BELOW ARE SYMPTOMS THAT SHOULD BE REPORTED IMMEDIATELY:  *FEVER GREATER THAN 100.5 F  *CHILLS WITH OR WITHOUT FEVER  NAUSEA AND VOMITING THAT IS NOT CONTROLLED WITH YOUR NAUSEA MEDICATION  *UNUSUAL SHORTNESS OF BREATH  *UNUSUAL BRUISING OR BLEEDING  TENDERNESS IN MOUTH AND THROAT WITH OR WITHOUT PRESENCE OF ULCERS  *URINARY PROBLEMS  *BOWEL PROBLEMS  UNUSUAL RASH Items with * indicate a potential emergency and should be followed up as soon as possible.  Feel free to call the clinic you have any questions or concerns. The clinic phone number is [phone].   Dehydration, Adult Dehydration is when you lose more fluids from the body than you take in. Vital organs like the kidneys, brain, and heart cannot function without a proper amount of fluids and salt. Any loss of fluids from the body can cause dehydration.  CAUSES   Vomiting.  Diarrhea.  Excessive sweating.  Excessive urine output.  Fever. SYMPTOMS  Mild dehydration  Thirst.  Dry lips.  Slightly dry mouth. Moderate dehydration  Very dry mouth.  Sunken eyes.  Skin does not bounce back quickly when lightly pinched and released.  Dark urine and decreased urine production.  Decreased tear production.  Headache. Severe dehydration  Very dry mouth.  Extreme thirst.  Rapid, weak pulse (more than 100 beats per minute at rest).  Cold hands and feet.  Not able to sweat in spite of heat and temperature.  Rapid breathing.  Blue lips.  Confusion and lethargy.  Difficulty being awakened.  Minimal urine  production.  No tears. DIAGNOSIS  Your caregiver will diagnose dehydration based on your symptoms and your exam. Blood and urine tests will help confirm the diagnosis. The diagnostic evaluation should also identify the cause of dehydration. TREATMENT  Treatment of mild or moderate dehydration can often be done at home by increasing the amount of fluids that you drink. It is best to drink small amounts of fluid more often. Drinking too much at one time can make vomiting worse. Refer to the home care instructions below. Severe dehydration needs to be treated at the hospital where you will probably be given intravenous (IV) fluids that contain water and electrolytes. HOME CARE INSTRUCTIONS   Ask your caregiver about specific rehydration instructions.  Drink enough fluids to keep your urine clear or pale yellow.  Drink small amounts frequently if you have nausea and vomiting.  Eat as you normally do.  Avoid:  Foods or drinks high in sugar.  Carbonated drinks.  Juice.  Extremely hot or cold fluids.  Drinks with caffeine.  Fatty, greasy foods.  Alcohol.  Tobacco.  Overeating.  Gelatin desserts.  Wash your hands well to avoid spreading bacteria and viruses.  Only take over-the-counter or prescription medicines for pain, discomfort, or fever as directed by your caregiver.  Ask your caregiver if you should continue all prescribed and over-the-counter medicines.  Keep all follow-up appointments with your caregiver. SEEK MEDICAL CARE IF:  You have abdominal pain and it increases or stays in one area (localizes).  You have a rash, stiff neck, or severe headache.  You are irritable, sleepy, or difficult to awaken.  You  are weak, dizzy, or extremely thirsty. SEEK IMMEDIATE MEDICAL CARE IF:   You are unable to keep fluids down or you get worse despite treatment.  You have frequent episodes of vomiting or diarrhea.  You have blood or green matter (bile) in your  vomit.  You have blood in your stool or your stool looks black and tarry.  You have not urinated in 6 to 8 hours, or you have only urinated a small amount of very dark urine.  You have a fever.  You faint. MAKE SURE YOU:   Understand these instructions.  Will watch your condition.  Will get help right away if you are not doing well or get worse. Document Released: 10/20/2005 Document Revised: 01/12/2012 Document Reviewed: 06/09/2011 [facility] Patient Information 2014 [place], [place].

## 2013-11-28 NOTE — Telephone Encounter (Signed)
gv pt appt schedule for feb. per 1/26 pof cont tx per tx plan. tx plan ends 2/9.

## 2013-11-28 NOTE — Progress Notes (Addendum)
No images are attached to the encounter. No scans are attached to the encounter. No scans are attached to the encounter. [name], MD [place] [address]  DIAGNOSIS: Non-small cell cancer of left lung   Primary site: Lung (Left)   Staging method: AJCC 7th Edition   Clinical free text: NSCLC   Clinical: Stage IIIA (T1b, N2, M0) signed by Cameron Bears, MD on 10/21/2013  9:48 AM   Pathologic free text: Negative EGFR mutation, Negative ALK gene translocation.   Summary: Stage IIIA (T1b, N2, M0)  PRIOR THERAPY: None  CURRENT THERAPY: Concurrent chemoradiation with weekly carboplatin for an AUC of 2 and paclitaxel 45 mg per meter squared for a total of 6-7 weeks according to the final dose of radiation  DISEASE STAGE: Non-small cell cancer of left lung   Primary site: Lung (Left)   Staging method: AJCC 7th Edition   Clinical free text: NSCLC   Clinical: Stage IIIA (T1b, N2, M0) signed by Cameron Bears, MD on 10/21/2013  9:48 AM   Pathologic free text: Negative EGFR mutation, Negative ALK gene translocation.   Summary: Stage IIIA (T1b, N2, M0)  CHEMOTHERAPY INTENT: Control/palliative  CURRENT # OF CHEMOTHERAPY CYCLES: 4  CURRENT ANTIEMETICS: Zofran, dexamethasone, Compazine  CURRENT SMOKING STATUS: Former smoker, quit 11/04/2003  ORAL CHEMOTHERAPY AND CONSENT: N./A.  CURRENT BISPHOSPHONATES USE: None  PAIN MANAGEMENT: Tramadol  NARCOTICS INDUCED CONSTIPATION: None  LIVING WILL AND CODE STATUS: ?   INTERVAL HISTORY: Cameron [name] 73 y.o. male returns for a regular symptom management visit for followup of his recently diagnosed stage IIIa (T1 B., N2, M0) poorly differentiated non-small cell lung cancer. He is currently undergoing a course of concurrent chemoradiation. He is scheduled to complete radiation on 12/21/2013. He continues to complain of some fatigue. He is  having some difficulty with esophageal spasms and has been placed on Carafate by radiation oncology. He reports some soreness at the edge of the right chest area near the axilla. He denies any trauma. The soreness began on Saturday and seems to be resolving. He denied further episodes of hemoptysis. He does not have much of an appetite but is forcing himself to eat. He is otherwise tolerating his course of concurrent chemoradiation without difficulty.  MEDICAL HISTORY: Past Medical History  Diagnosis Date  . COPD (chronic obstructive pulmonary disease)   . Asthma   . Coronary artery disease     a. s/p stenting to the RCA with subsequent CABGx4 (01/2005)->LIMA-LAD,SVG-diagonal, SVG-om, and SVG-PDA;  b. 03/2012 MV: EF 67%, no isch/infarct.  . Hyperlipidemia   . Osteoarthritis   . GERD (gastroesophageal reflux disease)   . Low back pain   . Non-small cell carcinoma of left lung 10/2013 dx    a. chemoradiation  . Hypertension   . Nephrolithiasis     x1  . 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  MEDICATIONS:  Current Outpatient Prescriptions  Medication Sig Dispense Refill  . albuterol (PROVENTIL HFA;VENTOLIN HFA) 108 (90 BASE) MCG/ACT inhaler Inhale 2 puffs into the lungs every 6 (six) hours as needed for wheezing or shortness of breath.  1 Inhaler  0  . budesonide-formoterol (SYMBICORT) 160-4.5 MCG/ACT inhaler Inhale 2 puffs into the lungs 2 (two) times daily.      . Cholecalciferol (VITAMIN D-3) 1000 UNITS CAPS  Take by mouth daily.      . clonazePAM (KLONOPIN) 0.5 MG tablet Take 0.5 mg by mouth at bedtime. As needed for sleep      . diphenhydrAMINE (BENADRYL) 25 MG tablet Take 25 mg by mouth every 6 (six) hours as needed for allergies.       . metoprolol succinate (TOPROL-XL) 25 MG 24 hr tablet Take 37.5 mg by mouth every morning. Takes 1 and 1/2      . omeprazole (PRILOSEC) 20 MG capsule  Take 20 mg by mouth daily.      . predniSONE (DELTASONE) 10 MG tablet Take 10 mg by mouth daily with breakfast.      . prochlorperazine (COMPAZINE) 10 MG tablet Take 1 tablet (10 mg total) by mouth every 6 (six) hours as needed for nausea or vomiting.  60 tablet  0  . promethazine-codeine (PHENERGAN WITH CODEINE) 6.25-10 MG/5ML syrup Take 5 mLs by mouth every 4 (four) hours as needed for cough.  180 mL  0  . sucralfate (CARAFATE) 1 G tablet Take 1 tablet (1 g total) by mouth 4 (four) times daily -  with meals and at bedtime. Grind into a glass of water, 5 minutes before meals  120 tablet  2  . Tamsulosin HCl (FLOMAX) 0.4 MG CAPS Take 0.4 mg by mouth daily.       . traMADol (ULTRAM) 50 MG tablet Take 50 mg by mouth every 6 (six) hours as needed for moderate pain.      . hyaluronate sodium (RADIAPLEXRX) GEL Apply 1 application topically 2 (two) times daily.      Marland Kitchen losartan (COZAAR) 25 MG tablet Take 1 tablet (25 mg total) by mouth 2 (two) times daily.      . nitroGLYCERIN (NITROSTAT) 0.4 MG SL tablet Place 0.4 mg under the tongue every 5 (five) minutes as needed. x3 doses as needed for chest pain      . triamcinolone cream (KENALOG) 0.5 % APPLY 3 TIMES A DAY TO RASH AS NEEDED  45 g  1   No current facility-administered medications for this visit.   Facility-Administered Medications Ordered in Other Visits  Medication Dose Route Frequency Provider Last Rate Last Dose  . CARBOplatin (PARAPLATIN) 150 mg in sodium chloride 0.9 % 100 mL chemo infusion  150 mg Intravenous Once Cameron Bears, MD 230 mL/hr at 11/28/13 1653 150 mg at 11/28/13 1653    SURGICAL HISTORY:  Past Surgical History  Procedure Laterality Date  . Appendectomy    . Hemorrhoid surgery    . Video bronchoscopy Bilateral 09/21/2013    Procedure: VIDEO BRONCHOSCOPY WITHOUT FLUORO;  Surgeon: Tanda Rockers, MD;  Location: [facility];  Service: Cardiopulmonary;  Laterality: Bilateral;  . Coronary artery bypass graft      ,stent  place prior-no problems now  . Endobronchial ultrasound Bilateral 10/17/2013    Procedure: ENDOBRONCHIAL ULTRASOUND;  Surgeon: Collene Gobble, MD;  Location: [facility];  Service: Cardiopulmonary;  Laterality: Bilateral;    REVIEW OF SYSTEMS:  Constitutional: positive for anorexia and fatigue Eyes: negative Ears, nose, mouth, throat, and face: negative Respiratory: positive for hemoptysis Cardiovascular: negative Gastrointestinal: positive for dysphagia Genitourinary:negative Integument/breast: negative Hematologic/lymphatic: negative Musculoskeletal:positive for myalgias Neurological: negative Behavioral/Psych: negative Endocrine: negative Allergic/Immunologic: negative   PHYSICAL EXAMINATION: General appearance: alert, cooperative, appears stated age and no distress Head: Normocephalic, without obvious abnormality, atraumatic Neck: no adenopathy, no carotid bruit, no JVD, supple, symmetrical, trachea midline and thyroid not enlarged, symmetric, no tenderness/mass/nodules Lymph nodes:  Cervical, supraclavicular, and axillary nodes normal. Resp: clear to auscultation bilaterally Back: symmetric, no curvature. ROM normal. No CVA tenderness. Cardio: regular rate and rhythm, S1, S2 normal, no murmur, click, rub or gallop GI: soft, non-tender; bowel sounds normal; no masses,  no organomegaly Extremities: extremities normal, atraumatic, no cyanosis or edema Neurologic: Alert and oriented X 3, normal strength and tone. Normal symmetric reflexes. Normal coordination and gait No point tenderness, masses,erythema or warmth noted in the right lateral chest or right axilla  ECOG PERFORMANCE STATUS: 1 - Symptomatic but completely ambulatory  Blood pressure 96/69, pulse 94, temperature 97.7 F (36.5 C), temperature source Oral, resp. rate 18, height 5' 8"  (1.727 m), weight 130 lb 12.8 oz (59.33 kg).  LABORATORY DATA: Lab Results  Component Value Date   WBC 3.7* 11/28/2013   HGB 10.7*  11/28/2013   HCT 31.2* 11/28/2013   MCV 95.1 11/28/2013   PLT 128* 11/28/2013      Chemistry      Component Value Date/Time   NA 136 11/28/2013 1320   NA 134* 09/27/2013 1733   K 4.5 11/28/2013 1320   K 4.6 09/27/2013 1733   CL 101 09/27/2013 1733   CO2 28 11/28/2013 1320   CO2 31 09/27/2013 1733   BUN 16.8 11/28/2013 1320   BUN 24* 09/27/2013 1733   CREATININE 1.1 11/28/2013 1320   CREATININE 1.3 09/27/2013 1733      Component Value Date/Time   CALCIUM 9.2 11/28/2013 1320   CALCIUM 8.7 09/27/2013 1733   ALKPHOS 56 11/28/2013 1320   ALKPHOS 52 09/27/2013 1733   AST 13 11/28/2013 1320   AST 230* 09/27/2013 1733   ALT 15 11/28/2013 1320   ALT 96* 09/27/2013 1733   BILITOT 0.51 11/28/2013 1320   BILITOT 0.7 09/27/2013 1733       RADIOGRAPHIC STUDIES:  Dg Chest 2 View  10/24/2013   CLINICAL DATA:  Cough.  History of lung cancer.  EXAM: CHEST  2 VIEW  COMPARISON:  PET-CT 10/12/2013; chest radiograph 09/05/2013.  FINDINGS: Stable cardiac and mediastinal contours status post median sternotomy. No large consolidative pulmonary opacity. No definite pleural effusion or pneumothorax. Extensive emphysematous change. Re- demonstrated 2.5 cm mass within the superior segment left lower lobe.  IMPRESSION: Re- demonstrated 2.5 cm mass within the superior segment left lower lobe.  No evidence for superimposed acute cardiopulmonary process.   Electronically Signed   By: Lovey Newcomer M.D.   On: 10/24/2013 16:36   Mr Jeri Cos WS Contrast  10/25/2013   CLINICAL DATA:  Non-small-cell lung cancer staging.  EXAM: MRI HEAD WITHOUT AND WITH CONTRAST  TECHNIQUE: Multiplanar, multiecho pulse sequences of the brain and surrounding structures were obtained without and with intravenous contrast.  CONTRAST:  1m MULTIHANCE GADOBENATE DIMEGLUMINE 529 MG/ML IV SOLN  COMPARISON:  None.  FINDINGS: Two punctate foci of susceptibility artifact are present in the white matter of the posterior right frontal and parietal lobes  and may reflect remote microhemorrhage. There are multiple small foci of T2 hyperintensity within the subcortical and deep cerebral white matter and pons, nonspecific but compatible with mild chronic small vessel ischemic disease. Ventricles and sulci are within normal limits for age. There is no evidence of acute infarct, mass, midline shift, or extra-axial collection. There is no abnormal enhancement. Orbits and visualized paranasal sinuses and mastoid air cells are unremarkable. Major intracranial vascular flow voids are unremarkable.  IMPRESSION: No evidence of intracranial metastatic disease.   Electronically Signed   By: AZenia Resides  Jeralyn Ruths   On: 10/25/2013 14:36     ASSESSMENT/PLAN: The patient is a very pleasant 73 year old Caucasian male recently diagnosed with stage IIIa (T1 B., N2, M0) poorly differentiated non-small cell lung cancer, presenting with left lower lobe pulmonary nodule and ipsilateral as well as subcarinal mediastinal lymphadenopathy diagnosed in December 2014. The subtype was not identified secondary to nonspecific specific immunostains. He is currently undergoing a course of concurrent chemoradiation in other than some fatigue and decreased appetite is tolerating this course of treatment without significant difficulty.  Patient was discussed with and also seen by Dr. Julien Nordmann. He will continue with his course of concurrent chemoradiation return in 2 weeks for another symptom management visit.     Wynetta Emery, Navi Ewton E, PA-C     All questions were answered. The patient knows to call the clinic with any problems, questions or concerns. We can certainly see the patient much sooner if necessary.  ADDENDUM: Hematology/Oncology Attending: I had the face to face encounter with the patient. I recommended his care plan. This is a very pleasant 73 years old white male with history of stage IIIA non-small cell lung cancer currently undergoing concurrent chemoradiation with weekly carboplatin  and paclitaxel is status post 4 cycles and he is tolerating his treatment fairly well with no significant adverse effects except for mild fatigue. I recommended for the patient to continue his current treatment as scheduled. He would come back for follow up visit in 2 weeks for reevaluation. He was advised to call immediately if he has any concerning symptoms in the interval.   Disclaimer: This note was dictated with voice recognition software. Similar sounding words can inadvertently be transcribed and may not be corrected upon review. Eilleen Kempf., MD 11/29/2013

## 2013-11-29 ENCOUNTER — Ambulatory Visit
Admission: RE | Admit: 2013-11-29 | Discharge: 2013-11-29 | Disposition: A | Discharge status: Home / Self Care | Payer: Medicare Other | Source: Ambulatory Visit | Attending: Radiation Oncology | Admitting: Radiation Oncology

## 2013-11-29 NOTE — Patient Instructions (Signed)
Continue with labs and chemotherapy as scheduled Follow up in 2 weeks

## 2013-11-30 ENCOUNTER — Ambulatory Visit
Admission: RE | Admit: 2013-11-30 | Discharge: 2013-11-30 | Disposition: A | Discharge status: Home / Self Care | Payer: Medicare Other | Source: Ambulatory Visit | Attending: Radiation Oncology | Admitting: Radiation Oncology

## 2013-12-01 ENCOUNTER — Ambulatory Visit
Admission: RE | Admit: 2013-12-01 | Discharge: 2013-12-01 | Disposition: A | Discharge status: Home / Self Care | Payer: Medicare Other | Source: Ambulatory Visit | Attending: Radiation Oncology | Admitting: Radiation Oncology

## 2013-12-02 ENCOUNTER — Inpatient Hospital Stay (HOSPITAL_COMMUNITY)
Admission: EM | Admit: 2013-12-02 | Discharge: 2013-12-13 | DRG: 808 | Disposition: A | Discharge status: Home Health | Payer: Medicare Other | Attending: Family Medicine | Admitting: Family Medicine

## 2013-12-02 ENCOUNTER — Encounter: Payer: Self-pay | Admitting: Radiation Oncology

## 2013-12-02 ENCOUNTER — Telehealth: Payer: Self-pay | Admitting: *Deleted

## 2013-12-02 ENCOUNTER — Ambulatory Visit (HOSPITAL_COMMUNITY)
Admission: RE | Admit: 2013-12-02 | Discharge: 2013-12-02 | Disposition: A | Discharge status: Home / Self Care | Payer: Medicare Other | Source: Ambulatory Visit | Attending: Radiation Oncology | Admitting: Radiation Oncology

## 2013-12-02 ENCOUNTER — Other Ambulatory Visit: Payer: Self-pay | Admitting: *Deleted

## 2013-12-02 ENCOUNTER — Other Ambulatory Visit: Payer: Self-pay | Admitting: Physician Assistant

## 2013-12-02 ENCOUNTER — Emergency Department (HOSPITAL_COMMUNITY): Payer: Medicare Other

## 2013-12-02 ENCOUNTER — Ambulatory Visit
Admission: RE | Admit: 2013-12-02 | Discharge: 2013-12-02 | Disposition: A | Discharge status: Home / Self Care | Payer: Medicare Other | Source: Ambulatory Visit | Attending: Radiation Oncology | Admitting: Radiation Oncology

## 2013-12-02 ENCOUNTER — Ambulatory Visit (HOSPITAL_BASED_OUTPATIENT_CLINIC_OR_DEPARTMENT_OTHER): Payer: Medicare Other

## 2013-12-02 ENCOUNTER — Encounter (HOSPITAL_COMMUNITY): Payer: Self-pay | Admitting: Emergency Medicine

## 2013-12-02 VITALS — BP 123/67 | HR 62 | Temp 98.8°F | Resp 20 | Wt 131.0 lb

## 2013-12-02 DIAGNOSIS — R05 Cough: Secondary | ICD-10-CM

## 2013-12-02 DIAGNOSIS — C3430 Malignant neoplasm of lower lobe, unspecified bronchus or lung: Secondary | ICD-10-CM

## 2013-12-02 DIAGNOSIS — R599 Enlarged lymph nodes, unspecified: Secondary | ICD-10-CM | POA: Diagnosis present

## 2013-12-02 DIAGNOSIS — Z888 Allergy status to other drugs, medicaments and biological substances status: Secondary | ICD-10-CM

## 2013-12-02 DIAGNOSIS — I959 Hypotension, unspecified: Secondary | ICD-10-CM

## 2013-12-02 DIAGNOSIS — I2510 Atherosclerotic heart disease of native coronary artery without angina pectoris: Secondary | ICD-10-CM | POA: Diagnosis present

## 2013-12-02 DIAGNOSIS — C787 Secondary malignant neoplasm of liver and intrahepatic bile duct: Secondary | ICD-10-CM | POA: Diagnosis present

## 2013-12-02 DIAGNOSIS — R059 Cough, unspecified: Secondary | ICD-10-CM | POA: Diagnosis present

## 2013-12-02 DIAGNOSIS — C3492 Malignant neoplasm of unspecified part of left bronchus or lung: Secondary | ICD-10-CM

## 2013-12-02 DIAGNOSIS — R062 Wheezing: Secondary | ICD-10-CM

## 2013-12-02 DIAGNOSIS — J45909 Unspecified asthma, uncomplicated: Secondary | ICD-10-CM | POA: Diagnosis present

## 2013-12-02 DIAGNOSIS — M1990 Unspecified osteoarthritis, unspecified site: Secondary | ICD-10-CM | POA: Diagnosis present

## 2013-12-02 DIAGNOSIS — D709 Neutropenia, unspecified: Principal | ICD-10-CM | POA: Diagnosis present

## 2013-12-02 DIAGNOSIS — R5081 Fever presenting with conditions classified elsewhere: Secondary | ICD-10-CM | POA: Diagnosis present

## 2013-12-02 DIAGNOSIS — Z87442 Personal history of urinary calculi: Secondary | ICD-10-CM

## 2013-12-02 DIAGNOSIS — R509 Fever, unspecified: Secondary | ICD-10-CM

## 2013-12-02 DIAGNOSIS — R0781 Pleurodynia: Secondary | ICD-10-CM | POA: Diagnosis present

## 2013-12-02 DIAGNOSIS — R0989 Other specified symptoms and signs involving the circulatory and respiratory systems: Secondary | ICD-10-CM | POA: Diagnosis present

## 2013-12-02 DIAGNOSIS — T451X5A Adverse effect of antineoplastic and immunosuppressive drugs, initial encounter: Secondary | ICD-10-CM | POA: Diagnosis present

## 2013-12-02 DIAGNOSIS — E785 Hyperlipidemia, unspecified: Secondary | ICD-10-CM | POA: Diagnosis present

## 2013-12-02 DIAGNOSIS — Z880 Allergy status to penicillin: Secondary | ICD-10-CM

## 2013-12-02 DIAGNOSIS — R0609 Other forms of dyspnea: Secondary | ICD-10-CM | POA: Diagnosis present

## 2013-12-02 DIAGNOSIS — Z951 Presence of aortocoronary bypass graft: Secondary | ICD-10-CM

## 2013-12-02 DIAGNOSIS — C3490 Malignant neoplasm of unspecified part of unspecified bronchus or lung: Secondary | ICD-10-CM | POA: Diagnosis present

## 2013-12-02 DIAGNOSIS — Z9861 Coronary angioplasty status: Secondary | ICD-10-CM

## 2013-12-02 DIAGNOSIS — J449 Chronic obstructive pulmonary disease, unspecified: Secondary | ICD-10-CM | POA: Diagnosis present

## 2013-12-02 DIAGNOSIS — R5381 Other malaise: Secondary | ICD-10-CM | POA: Diagnosis present

## 2013-12-02 DIAGNOSIS — E43 Unspecified severe protein-calorie malnutrition: Secondary | ICD-10-CM | POA: Diagnosis present

## 2013-12-02 DIAGNOSIS — Z87891 Personal history of nicotine dependence: Secondary | ICD-10-CM

## 2013-12-02 DIAGNOSIS — J4489 Other specified chronic obstructive pulmonary disease: Secondary | ICD-10-CM | POA: Diagnosis present

## 2013-12-02 DIAGNOSIS — IMO0002 Reserved for concepts with insufficient information to code with codable children: Secondary | ICD-10-CM

## 2013-12-02 DIAGNOSIS — K219 Gastro-esophageal reflux disease without esophagitis: Secondary | ICD-10-CM | POA: Diagnosis present

## 2013-12-02 DIAGNOSIS — Z79899 Other long term (current) drug therapy: Secondary | ICD-10-CM

## 2013-12-02 DIAGNOSIS — R5383 Other fatigue: Secondary | ICD-10-CM

## 2013-12-02 DIAGNOSIS — D61810 Antineoplastic chemotherapy induced pancytopenia: Secondary | ICD-10-CM | POA: Diagnosis present

## 2013-12-02 DIAGNOSIS — R000 Tachycardia, unspecified: Secondary | ICD-10-CM | POA: Diagnosis present

## 2013-12-02 DIAGNOSIS — I10 Essential (primary) hypertension: Secondary | ICD-10-CM | POA: Diagnosis present

## 2013-12-02 HISTORY — DX: Malignant (primary) neoplasm, unspecified: C80.1

## 2013-12-02 LAB — CG4 I-STAT (LACTIC ACID): Lactic Acid, Venous: 1.57 mmol/L (ref 0.5–2.2)

## 2013-12-02 MED ORDER — DEXTROSE-NACL 5-0.9 % IV SOLN
Freq: Once | INTRAVENOUS | Status: DC
  Filled 2013-12-02: qty 1000

## 2013-12-02 MED ORDER — SODIUM CHLORIDE 0.9 % IV SOLN
Freq: Once | INTRAVENOUS | Status: DC
  Administered 2013-12-02: 11:00:00 via INTRAVENOUS

## 2013-12-02 MED ORDER — PROMETHAZINE-CODEINE 6.25-10 MG/5ML PO SYRP: 5.0000 mL | ORAL_SOLUTION | ORAL | Status: DC | PRN

## 2013-12-02 MED ORDER — ALBUTEROL SULFATE (2.5 MG/3ML) 0.083% IN NEBU
2.5000 mg | INHALATION_SOLUTION | Freq: Once | RESPIRATORY_TRACT | Status: AC
  Administered 2013-12-02: 2.5 mg via RESPIRATORY_TRACT
  Filled 2013-12-02: qty 3

## 2013-12-02 NOTE — Progress Notes (Signed)
  Radiation Oncology         [phone] ________________________________  Name: Cameron [name] MRN: [id number]  Date: 12/02/2013  DOB: 1940/11/06  Weekly Radiation Therapy Management  Current Dose: 40 Gy     Planned Dose:  66 Gy  Narrative . . . . . . . . The patient presents for routine under treatment assessment.  Reports he was up all night coughing up thick ropey clear sputum. Wife reports audible wheezing yesterday. SOB with minimal exertion. Weight stable. Wife reports he has only taken in about 16 oz.thus, medical oncology hydrated him this morning. Also, med onc gave the patient a breathing treatment. Pulse ox 84%. Reports nausea yesterday unrelieved with antiemetic. Patient denies taking phenergan codeine cough syrup. Next chemotherapy scheduled for Monday                                   The patient is without complaint.                                 Set-up films were reviewed.                                 The chart was checked. Physical Findings. . .  weight is 131 lb (59.421 kg). His oral temperature is 98.8 F (37.1 C). His blood pressure is 123/67 and his pulse is 62. His respiration is 20 and oxygen saturation is 84%. . Weight essentially stable.  No significant changes. Impression . . . . . . . The patient is tolerating radiation. Plan . . . . . . . . . . . . Continue treatment as planned.  Will refill cough syrup and IV bolus.  ________________________________  Sheral Apley Tammi Klippel, M.D.

## 2013-12-02 NOTE — ED Notes (Signed)
Pt states that he feels generally weak, pt had chemo pn Monday and radiation this afternoon; pt states he received fluids from the Cancer today and was sent for chest xray; pt states that he had a fever of 102 at home and has a persistent cough.

## 2013-12-02 NOTE — Patient Instructions (Signed)
Dehydration  Dehydration is when you lose more fluids from the body than you take in. Vital organs such as the kidneys, brain, and heart cannot function without a proper amount of fluids and salt. Any loss of fluids from the body can cause dehydration.  Older adults are at a higher risk of dehydration than younger adults. As we age, our bodies are less able to conserve water and do not respond to temperature changes as well. Also, older adults do not become thirsty as easily or quickly. Because of this, older adults often do not realize they need to increase fluids to avoid dehydration.   CAUSES   Vomiting.  Diarrhea.  Excessive sweating.  Excessive urination.  Fever.  Certain medicines, such as blood pressure medicines called diuretics.  Poorly controlled blood sugars.  SIGNS AND SYMPTOMS  Mild dehydration:  Thirst.  Dry lips.  Slightly dry mouth. Moderate dehydration:  Very dry mouth.  Sunken eyes.  Skin does not bounce back quickly when lightly pinched and released.  Dark urine and decreased urine production.  Decreased tear production.  Headache. Severe dehydration:  Very dry mouth.  Extreme thirst.  Rapid, weak pulse (more than 100 beats per minute at rest).  Cold hands and feet.  Not able to sweat in spite of heat.  Rapid breathing.  Blue lips.  Confusion and lethargy.  Difficulty being awakened.  Minimal urine production.  No tears.  DIAGNOSIS  Your health care provider will diagnose dehydration based on your symptoms and your exam. Blood and urine tests will help confirm the diagnosis. The diagnostic evaluation should also identify the cause of dehydration.  TREATMENT  Treatment of mild or moderate dehydration can often be done at home by increasing the amount of fluids that you drink. It is best to drink small amounts of fluid more often. Drinking too much at one time can make vomiting worse. Severe dehydration needs to be treated at the  hospital. You may be given IV fluids that contain water and electrolytes. HOME CARE INSTRUCTIONS   Ask your health care provider about specific rehydration instructions.  Drink enough fluids to keep your urine clear or pale yellow.  Drink small amounts frequently if you have nausea and vomiting.  Eat as you normally do.  Avoid:  Foods or drinks high in sugar.  Carbonated drinks.  Juice.  Extremely hot or cold fluids.  Drinks with caffeine.  Fatty, greasy foods.  Alcohol.  Tobacco.  Overeating.  Gelatin desserts.  Wash your hands well to avoid spreading bacteria and viruses.  Only take over-the-counter or prescription medicines for pain, discomfort, or fever as directed by your health care provider.  Ask your health care provider if you should continue all prescribed and over-the-counter medicines.  Keep all follow-up appointments with your health care provider. SEEK MEDICAL CARE IF:  You have abdominal pain, and it increases or stays in one area (localizes).  You have a rash, stiff neck, or severe headache.  You are irritable, sleepy, or difficult to awaken.  You are weak, dizzy, or extremely thirsty. SEEK IMMEDIATE MEDICAL CARE IF:   You are unable to keep fluids down, or you get worse despite treatment.  You have frequent episodes of vomiting or diarrhea.  You have blood or green matter (bile) in your vomit.  You have blood in your stool, or your stool looks black and tarry.  You have not urinated in 6 8 hours, or you have only urinated a small amount of very dark urine.  You have a fever.  You faint. MAKE SURE YOU:   Understand these instructions.  Will watch your condition.  Will get help right away if you are not doing well or get worse. Document Released: 01/10/2004 Document Revised: 08/10/2013 Document Reviewed: 06/27/2013 [facility] Patient Information 2014 [place].

## 2013-12-02 NOTE — Progress Notes (Signed)
Patient complaining of SOB, coughing up lots of clear mucus and wheezing overnight. Upon listening to patient, no wheezing was noted. Awilda Metro, PA notified of patient's overnight complaints. Breathing treatment ordered and per Adrena patient is to go to ED if not effective or patient gets worse. Patient and wife notified of this and agreeable. Cindi Carbon, RN

## 2013-12-02 NOTE — Progress Notes (Signed)
Quick Note:  Please call patient with normal result.  Thanks. MM ______

## 2013-12-02 NOTE — Telephone Encounter (Signed)
PT.'S TEMPERATURE IS 98.8. HE IS WHEEZING AND SHORT OF BREATH WITH MOVEMENT. PT. IS VERY WEAK. HE IS NOT EATING AND FLUID INTAKE THE PAST 24 HOURS IS 16 OUNCES. VERBAL ORDER AND READ BACK TO DR.MOHAMED- HAVE PT. COME IN FOR ONE LITER OF IV FLUID.  CALLED SAM IN [facility] TO GIVE AN UPDATE ON PT.'S CONDITION. AFTER PT. RECEIVES IV FLUIDS [facility] WILL SEE PT. NOTIFIED PT. AND HIS WIFE OF THE ABOVE INSTRUCTIONS. THEY VOICE UNDERSTANDING.

## 2013-12-02 NOTE — Progress Notes (Signed)
Reports he was up all night coughing up thick ropey clear sputum. Wife reports audible wheezing yesterday. SOB with minimal exertion. Weight stable. Wife reports he has only taken in about 16 oz.thus, medical oncology hydrated him this morning. Also, med onc gave the patient a breathing treatment. Pulse ox 84%. Reports nausea yesterday unrelieved with antiemetic. Patient denies taking phenergan codeine cough syrup. Next chemotherapy scheduled for Monday.

## 2013-12-03 DIAGNOSIS — D709 Neutropenia, unspecified: Principal | ICD-10-CM

## 2013-12-03 DIAGNOSIS — R5081 Fever presenting with conditions classified elsewhere: Secondary | ICD-10-CM

## 2013-12-03 DIAGNOSIS — R509 Fever, unspecified: Secondary | ICD-10-CM

## 2013-12-03 DIAGNOSIS — C3490 Malignant neoplasm of unspecified part of unspecified bronchus or lung: Secondary | ICD-10-CM

## 2013-12-03 DIAGNOSIS — T451X5A Adverse effect of antineoplastic and immunosuppressive drugs, initial encounter: Secondary | ICD-10-CM

## 2013-12-03 DIAGNOSIS — J449 Chronic obstructive pulmonary disease, unspecified: Secondary | ICD-10-CM

## 2013-12-03 DIAGNOSIS — J4489 Other specified chronic obstructive pulmonary disease: Secondary | ICD-10-CM

## 2013-12-03 DIAGNOSIS — D61810 Antineoplastic chemotherapy induced pancytopenia: Secondary | ICD-10-CM | POA: Diagnosis present

## 2013-12-03 DIAGNOSIS — I2510 Atherosclerotic heart disease of native coronary artery without angina pectoris: Secondary | ICD-10-CM

## 2013-12-03 LAB — COMPREHENSIVE METABOLIC PANEL
ALK PHOS: 52 U/L (ref 39–117)
ALT: 13 U/L (ref 0–53)
AST: 18 U/L (ref 0–37)
Albumin: 2.9 g/dL — ABNORMAL LOW (ref 3.5–5.2)
BILIRUBIN TOTAL: 0.6 mg/dL (ref 0.3–1.2)
BUN: 14 mg/dL (ref 6–23)
CALCIUM: 8.4 mg/dL (ref 8.4–10.5)
CHLORIDE: 97 meq/L (ref 96–112)
CO2: 27 mEq/L (ref 19–32)
Creatinine, Ser: 1.05 mg/dL (ref 0.50–1.35)
GFR, EST AFRICAN AMERICAN: 80 mL/min — AB (ref >90)
GFR, EST NON AFRICAN AMERICAN: 69 mL/min — AB (ref >90)
GLUCOSE: 108 mg/dL — AB (ref 70–99)
POTASSIUM: 4 meq/L (ref 3.7–5.3)
SODIUM: 136 meq/L — AB (ref 137–147)
Total Protein: 5.8 g/dL — ABNORMAL LOW (ref 6.0–8.3)

## 2013-12-03 LAB — CBC WITH DIFFERENTIAL/PLATELET
BASOS PCT: 1 % (ref 0–1)
Basophils Absolute: 0 10*3/uL (ref 0.0–0.1)
EOS ABS: 0 10*3/uL (ref 0.0–0.7)
Eosinophils Relative: 1 % (ref 0–5)
HEMATOCRIT: 27 % — AB (ref 39.0–52.0)
Hemoglobin: 9.4 g/dL — ABNORMAL LOW (ref 13.0–17.0)
Lymphocytes Relative: 20 % (ref 12–46)
Lymphs Abs: 0.3 10*3/uL — ABNORMAL LOW (ref 0.7–4.0)
MCH: 32.8 pg (ref 26.0–34.0)
MCHC: 34.8 g/dL (ref 30.0–36.0)
MCV: 94.1 fL (ref 78.0–100.0)
Monocytes Absolute: 0.1 10*3/uL (ref 0.1–1.0)
Monocytes Relative: 7 % (ref 3–12)
NEUTROS ABS: 1.1 10*3/uL — AB (ref 1.7–7.7)
Neutrophils Relative %: 71 % (ref 43–77)
Platelets: 80 10*3/uL — ABNORMAL LOW (ref 150–400)
RBC: 2.87 MIL/uL — AB (ref 4.22–5.81)
RDW: 13.8 % (ref 11.5–15.5)
WBC: 1.5 10*3/uL — ABNORMAL LOW (ref 4.0–10.5)

## 2013-12-03 LAB — CBC
HCT: 23.4 % — ABNORMAL LOW (ref 39.0–52.0)
Hemoglobin: 8.1 g/dL — ABNORMAL LOW (ref 13.0–17.0)
MCH: 32.7 pg (ref 26.0–34.0)
MCHC: 34.6 g/dL (ref 30.0–36.0)
MCV: 94.4 fL (ref 78.0–100.0)
Platelets: 70 10*3/uL — ABNORMAL LOW (ref 150–400)
RBC: 2.48 MIL/uL — ABNORMAL LOW (ref 4.22–5.81)
RDW: 13.8 % (ref 11.5–15.5)
WBC: 1.3 10*3/uL — CL (ref 4.0–10.5)

## 2013-12-03 LAB — BASIC METABOLIC PANEL
BUN: 12 mg/dL (ref 6–23)
CO2: 25 mEq/L (ref 19–32)
CREATININE: 0.92 mg/dL (ref 0.50–1.35)
Calcium: 7.7 mg/dL — ABNORMAL LOW (ref 8.4–10.5)
Chloride: 97 mEq/L (ref 96–112)
GFR calc non Af Amer: 83 mL/min — ABNORMAL LOW (ref >90)
Glucose, Bld: 92 mg/dL (ref 70–99)
Potassium: 3.7 mEq/L (ref 3.7–5.3)
Sodium: 135 mEq/L — ABNORMAL LOW (ref 137–147)

## 2013-12-03 LAB — INFLUENZA PANEL BY PCR (TYPE A & B)
H1N1FLUPCR: NOT DETECTED
INFLAPCR: NEGATIVE
Influenza B By PCR: NEGATIVE

## 2013-12-03 LAB — URINALYSIS, ROUTINE W REFLEX MICROSCOPIC
BILIRUBIN URINE: NEGATIVE
Glucose, UA: NEGATIVE mg/dL
Hgb urine dipstick: NEGATIVE
KETONES UR: 15 mg/dL — AB
Leukocytes, UA: NEGATIVE
NITRITE: NEGATIVE
PROTEIN: NEGATIVE mg/dL
Specific Gravity, Urine: 1.018 (ref 1.005–1.030)
Urobilinogen, UA: 0.2 mg/dL (ref 0.0–1.0)
pH: 5.5 (ref 5.0–8.0)

## 2013-12-03 MED ORDER — VANCOMYCIN HCL 500 MG IV SOLR
500.0000 mg | Freq: Two times a day (BID) | INTRAVENOUS | Status: DC
  Filled 2013-12-03: qty 500

## 2013-12-03 MED ORDER — METOPROLOL SUCCINATE 12.5 MG HALF TABLET
12.5000 mg | ORAL_TABLET | Freq: Every day | ORAL | Status: DC
  Administered 2013-12-03 – 2013-12-09 (×7): 12.5 mg via ORAL
  Filled 2013-12-03 (×9): qty 1

## 2013-12-03 MED ORDER — METOPROLOL SUCCINATE ER 25 MG PO TB24
25.0000 mg | ORAL_TABLET | Freq: Every day | ORAL | Status: DC
  Administered 2013-12-03 – 2013-12-10 (×8): 25 mg via ORAL
  Filled 2013-12-03 (×8): qty 1

## 2013-12-03 MED ORDER — TAMSULOSIN HCL 0.4 MG PO CAPS
0.4000 mg | ORAL_CAPSULE | Freq: Every day | ORAL | Status: DC
  Administered 2013-12-03 – 2013-12-12 (×10): 0.4 mg via ORAL
  Filled 2013-12-03 (×11): qty 1

## 2013-12-03 MED ORDER — PANTOPRAZOLE SODIUM 40 MG PO TBEC
40.0000 mg | DELAYED_RELEASE_TABLET | Freq: Every day | ORAL | Status: DC
  Administered 2013-12-03 – 2013-12-12 (×10): 40 mg via ORAL
  Filled 2013-12-03 (×11): qty 1

## 2013-12-03 MED ORDER — ALUM & MAG HYDROXIDE-SIMETH 200-200-20 MG/5ML PO SUSP: 30.0000 mL | Freq: Four times a day (QID) | ORAL | Status: DC | PRN

## 2013-12-03 MED ORDER — VANCOMYCIN HCL IN DEXTROSE 1-5 GM/200ML-% IV SOLN
1000.0000 mg | Freq: Once | INTRAVENOUS | Status: AC
  Administered 2013-12-03: 1000 mg via INTRAVENOUS
  Filled 2013-12-03: qty 200

## 2013-12-03 MED ORDER — METOPROLOL SUCCINATE 12.5 MG HALF TABLET: 12.5000 mg | ORAL_TABLET | Freq: Two times a day (BID) | ORAL | Status: DC

## 2013-12-03 MED ORDER — ZOLPIDEM TARTRATE 5 MG PO TABS
5.0000 mg | ORAL_TABLET | Freq: Every evening | ORAL | Status: DC | PRN
  Filled 2013-12-03: qty 1

## 2013-12-03 MED ORDER — CLONAZEPAM 0.5 MG PO TABS
0.2500 mg | ORAL_TABLET | Freq: Every evening | ORAL | Status: DC | PRN
  Filled 2013-12-03: qty 1

## 2013-12-03 MED ORDER — PROMETHAZINE-CODEINE 6.25-10 MG/5ML PO SYRP
5.0000 mL | ORAL_SOLUTION | ORAL | Status: DC | PRN
  Administered 2013-12-03 – 2013-12-05 (×5): 5 mL via ORAL
  Filled 2013-12-03 (×5): qty 5

## 2013-12-03 MED ORDER — NITROGLYCERIN 0.4 MG SL SUBL: 0.4000 mg | SUBLINGUAL_TABLET | SUBLINGUAL | Status: DC | PRN

## 2013-12-03 MED ORDER — BUDESONIDE-FORMOTEROL FUMARATE 160-4.5 MCG/ACT IN AERO
2.0000 | INHALATION_SPRAY | Freq: Two times a day (BID) | RESPIRATORY_TRACT | Status: DC
  Administered 2013-12-03 – 2013-12-13 (×20): 2 via RESPIRATORY_TRACT
  Filled 2013-12-03: qty 6

## 2013-12-03 MED ORDER — ENOXAPARIN SODIUM 40 MG/0.4ML ~~LOC~~ SOLN
40.0000 mg | Freq: Every day | SUBCUTANEOUS | Status: DC
  Administered 2013-12-03 – 2013-12-12 (×10): 40 mg via SUBCUTANEOUS
  Filled 2013-12-03 (×11): qty 0.4

## 2013-12-03 MED ORDER — ONDANSETRON HCL 4 MG/2ML IJ SOLN: 4.0000 mg | Freq: Four times a day (QID) | INTRAMUSCULAR | Status: DC | PRN

## 2013-12-03 MED ORDER — LEVOFLOXACIN IN D5W 500 MG/100ML IV SOLN: 500.0000 mg | INTRAVENOUS | Status: DC

## 2013-12-03 MED ORDER — ACETAMINOPHEN 650 MG RE SUPP: 650.0000 mg | Freq: Four times a day (QID) | RECTAL | Status: DC | PRN

## 2013-12-03 MED ORDER — HYDROMORPHONE HCL PF 1 MG/ML IJ SOLN
0.5000 mg | INTRAMUSCULAR | Status: DC | PRN
  Administered 2013-12-06 (×2): 0.5 mg via INTRAVENOUS
  Administered 2013-12-06 – 2013-12-12 (×6): 1 mg via INTRAVENOUS
  Filled 2013-12-03 (×9): qty 1

## 2013-12-03 MED ORDER — GUAIFENESIN-DM 100-10 MG/5ML PO SYRP
5.0000 mL | ORAL_SOLUTION | ORAL | Status: DC | PRN
  Administered 2013-12-03 (×2): 5 mL via ORAL
  Filled 2013-12-03 (×3): qty 10

## 2013-12-03 MED ORDER — RADIAPLEXRX EX GEL: 1.0000 "application " | Freq: Two times a day (BID) | CUTANEOUS | Status: DC | PRN

## 2013-12-03 MED ORDER — ALBUTEROL SULFATE (2.5 MG/3ML) 0.083% IN NEBU
3.0000 mL | INHALATION_SOLUTION | Freq: Four times a day (QID) | RESPIRATORY_TRACT | Status: DC | PRN
  Administered 2013-12-06 – 2013-12-10 (×8): 3 mL via RESPIRATORY_TRACT
  Filled 2013-12-03 (×8): qty 3

## 2013-12-03 MED ORDER — ACETAMINOPHEN 325 MG PO TABS: 650.0000 mg | ORAL_TABLET | Freq: Four times a day (QID) | ORAL | Status: DC | PRN

## 2013-12-03 MED ORDER — ONDANSETRON HCL 4 MG PO TABS: 4.0000 mg | ORAL_TABLET | Freq: Four times a day (QID) | ORAL | Status: DC | PRN

## 2013-12-03 MED ORDER — PREDNISONE 10 MG PO TABS
10.0000 mg | ORAL_TABLET | Freq: Every day | ORAL | Status: DC
  Administered 2013-12-03 – 2013-12-09 (×7): 10 mg via ORAL
  Filled 2013-12-03 (×9): qty 1

## 2013-12-03 MED ORDER — LEVOFLOXACIN IN D5W 500 MG/100ML IV SOLN
500.0000 mg | Freq: Once | INTRAVENOUS | Status: AC
Start: 2013-12-03 — End: 2013-12-03
  Administered 2013-12-03: 500 mg via INTRAVENOUS
  Filled 2013-12-03: qty 100

## 2013-12-03 MED ORDER — SODIUM CHLORIDE 0.9 % IJ SOLN
3.0000 mL | Freq: Two times a day (BID) | INTRAMUSCULAR | Status: DC
  Administered 2013-12-04 – 2013-12-12 (×13): 3 mL via INTRAVENOUS

## 2013-12-03 MED ORDER — SODIUM CHLORIDE 0.9 % IV SOLN
250.0000 mg | Freq: Four times a day (QID) | INTRAVENOUS | Status: DC
  Administered 2013-12-03 – 2013-12-08 (×20): 250 mg via INTRAVENOUS
  Filled 2013-12-03 (×21): qty 250

## 2013-12-03 MED ORDER — OXYCODONE HCL 5 MG PO TABS
5.0000 mg | ORAL_TABLET | ORAL | Status: DC | PRN
  Administered 2013-12-05 – 2013-12-12 (×9): 5 mg via ORAL
  Filled 2013-12-03 (×10): qty 1

## 2013-12-03 MED ORDER — SODIUM CHLORIDE 0.9 % IV SOLN
INTRAVENOUS | Status: DC
  Administered 2013-12-03: 21:00:00 via INTRAVENOUS
  Administered 2013-12-04: 75 mL/h via INTRAVENOUS
  Administered 2013-12-05: 03:00:00 via INTRAVENOUS

## 2013-12-03 NOTE — ED Provider Notes (Signed)
CSN: [id number]     Arrival date & time 12/02/13  2154 History   First MD Initiated Contact with Patient 12/02/13 2255     Chief Complaint  Patient presents with  . Fever  . Cough   (Consider location/radiation/quality/duration/timing/severity/associated sxs/prior Treatment) Patient is a 73 y.o. male presenting with fever and cough. The history is provided by the patient. No language interpreter was used.  Fever Associated symptoms: cough   Associated symptoms: no confusion, no congestion, no diarrhea, no dysuria, no nausea, no rhinorrhea, no sore throat and no vomiting   Associated symptoms comment:  Patient with a history of lung CA, undergoing chemotherapy and radiation and followed by Dr. Earlie Server. He presents to the ED for generalized weakness and a fever at home with Tmax 102. He has a productive cough. He was seen at the [facility] today for scheduled radiation treatment (last chemo 11/28/13), given IV fluids because he "looked dry" per his wife and sent for a chest x-ray that was reported as clear of pneumonia. Tonight he developed a fever and came to the emergency department for further evaluation. No vomiting, diarrhea. He denies significant pain.  Cough Associated symptoms: fever   Associated symptoms: no rhinorrhea and no sore throat     Past Medical History  Diagnosis Date  . COPD (chronic obstructive pulmonary disease)   . Asthma   . Coronary artery disease     a. s/p stenting to the RCA with subsequent CABGx4 (01/2005)->LIMA-LAD,SVG-diagonal, SVG-om, and SVG-PDA;  b. 03/2012 MV: EF 67%, no isch/infarct.  . Hyperlipidemia   . Osteoarthritis   . GERD (gastroesophageal reflux disease)   . Low back pain   . Non-small cell carcinoma of left lung 10/2013 dx    a. chemoradiation  . Hypertension   . Nephrolithiasis     x1  . 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 Appendectomy    . Hemorrhoid  surgery    . Video bronchoscopy Bilateral 09/21/2013    Procedure: VIDEO BRONCHOSCOPY WITHOUT FLUORO;  Surgeon: Tanda Rockers, MD;  Location: [facility];  Service: Cardiopulmonary;  Laterality: Bilateral;  . Coronary artery bypass graft      ,stent place prior-no problems now  . Endobronchial ultrasound Bilateral 10/17/2013    Procedure: ENDOBRONCHIAL ULTRASOUND;  Surgeon: Collene Gobble, MD;  Location: [facility];  Service: Cardiopulmonary;  Laterality: Bilateral;   Family History  Problem Relation Age of Onset  . Heart attack Mother   . Diabetes Mother   . Heart attack Father   . Heart attack Brother   . Hypertension Other   . Colon cancer Neg Hx   . Emphysema Brother     smoked   History  Substance Use Topics  . Smoking status: Former Smoker -- 1.00 packs/day for 40 years    Types: Cigarettes    Quit date: 11/04/2003  . Smokeless tobacco: Never Used  . Alcohol Use: No    Review of Systems  Constitutional: Positive for fever.  HENT: Negative for congestion, rhinorrhea and sore throat.   Respiratory: Positive for cough.   Gastrointestinal: Negative for nausea, vomiting and diarrhea.  Genitourinary: Negative.  Negative for dysuria.  Musculoskeletal: Negative.   Skin: Negative.   Neurological: Positive for weakness.  Psychiatric/Behavioral: Negative for confusion.    Allergies  Pravastatin; Rosuvastatin; Zolpidem; and Augmentin  Home Medications   Current Outpatient Rx  Name  Route  Sig  Dispense  Refill  . albuterol (PROVENTIL HFA;VENTOLIN HFA) 108 (90 BASE) MCG/ACT inhaler   Inhalation   Inhale 2 puffs into the lungs every 6 (six) hours as needed for wheezing or shortness of breath.   1 Inhaler   0   . budesonide-formoterol (SYMBICORT) 160-4.5 MCG/ACT inhaler   Inhalation   Inhale 2 puffs into the lungs 2 (two) times daily.         . cholecalciferol (VITAMIN D) 1000 UNITS tablet   Oral   Take 1,000 Units by mouth every morning.         .  clonazePAM (KLONOPIN) 0.5 MG tablet   Oral   Take 0.25 mg by mouth at bedtime as needed (sleep). As needed for sleep         . diphenhydrAMINE (BENADRYL) 25 MG tablet   Oral   Take 25 mg by mouth every 6 (six) hours as needed for allergies.          . metoprolol succinate (TOPROL-XL) 25 MG 24 hr tablet   Oral   Take 12.5-25 mg by mouth 2 (two) times daily. Take 25mg  in the morning and 12.5mg  at night         . nitroGLYCERIN (NITROSTAT) 0.4 MG SL tablet   Sublingual   Place 0.4 mg under the tongue every 5 (five) minutes as needed for chest pain. x3 doses as needed for chest pain         . omeprazole (PRILOSEC) 20 MG capsule   Oral   Take 20 mg by mouth every morning.          . predniSONE (DELTASONE) 10 MG tablet   Oral   Take 10 mg by mouth daily with breakfast.         . prochlorperazine (COMPAZINE) 10 MG tablet   Oral   Take 1 tablet (10 mg total) by mouth every 6 (six) hours as needed for nausea or vomiting.   60 tablet   0   . promethazine-codeine (PHENERGAN WITH CODEINE) 6.25-10 MG/5ML syrup   Oral   Take 5 mLs by mouth every 4 (four) hours as needed for cough.   480 mL   0   . sucralfate (CARAFATE) 1 G tablet   Oral   Take 1 tablet (1 g total) by mouth 4 (four) times daily -  with meals and at bedtime. Grind into a glass of water, 5 minutes before meals   120 tablet   2   . Tamsulosin HCl (FLOMAX) 0.4 MG CAPS   Oral   Take 0.4 mg by mouth daily.          . traMADol (ULTRAM) 50 MG tablet   Oral   Take 50 mg by mouth every 6 (six) hours as needed for moderate pain.         Marland Kitchen triamcinolone cream (KENALOG) 0.5 %   Topical   Apply 1 application topically 3 (three) times daily as needed (rash).         . hyaluronate sodium (RADIAPLEXRX) GEL   Topical   Apply 1 application topically 2 (two) times daily as needed (for skin irritation due to radiation treatment).           BP 112/72  Pulse 110  Temp(Src) 99.1 F (37.3 C) (Oral)  Resp 18   SpO2 95% Physical Exam  Constitutional: He is oriented to person, place, and time. He appears well-developed and well-nourished. No distress.  HENT:  Head: Normocephalic.  Mouth/Throat: Mucous membranes are  dry.  Neck: Normal range of motion. Neck supple.  Cardiovascular: Regular rhythm.  Tachycardia present.   Pulmonary/Chest: Effort normal and breath sounds normal.  Abdominal: Soft. Bowel sounds are normal. There is no tenderness. There is no rebound and no guarding.  Musculoskeletal: Normal range of motion.  Neurological: He is alert and oriented to person, place, and time.  Skin: Skin is warm and dry. No rash noted.  Psychiatric: He has a normal mood and affect.    ED Course  Procedures (including critical care time) Labs Review Labs Reviewed  CBC WITH DIFFERENTIAL - Abnormal; Notable for the following:    WBC 1.5 (*)    RBC 2.87 (*)    Hemoglobin 9.4 (*)    HCT 27.0 (*)    Neutro Abs 1.1 (*)    Lymphs Abs 0.3 (*)    All other components within normal limits  COMPREHENSIVE METABOLIC PANEL - Abnormal; Notable for the following:    Sodium 136 (*)    Glucose, Bld 108 (*)    Total Protein 5.8 (*)    Albumin 2.9 (*)    GFR calc non Af Amer 69 (*)    GFR calc Af Amer 80 (*)    All other components within normal limits  CULTURE, BLOOD (ROUTINE X 2)  CULTURE, BLOOD (ROUTINE X 2)  URINE CULTURE  URINALYSIS, ROUTINE W REFLEX MICROSCOPIC  CG4 I-STAT (LACTIC ACID)  POCT LACTIC ACID (LACTATE)   Imaging Review Dg Chest 2 View  12/02/2013   CLINICAL DATA:  Cough and congestion  EXAM: CHEST  2 VIEW  COMPARISON:  10/24/2013  FINDINGS: There is is stable mass lesion identified in the left lower lobe superimposed over the spine. Postsurgical changes are again seen. The lungs are hyperinflated. No focal infiltrate or sizable effusion is seen.  IMPRESSION: Stable left lower lobe mass lesion. No new focal abnormality is seen.   Electronically Signed   By: Inez Catalina M.D.   On:  12/02/2013 15:24    EKG Interpretation   None       MDM  No diagnosis found. 1.  Pneumonia 2. Lung cancer 3. Leukopenia  The patient is seen with Dr. Roxanne Mins. He is non-toxic in appearance but with declining symptoms of cough, fever in the setting of neutropenic state. Will admit. Discussed with [facility].  Dewaine Oats, PA-C 12/04/13 1627

## 2013-12-03 NOTE — Progress Notes (Signed)
[facility] PROGRESS NOTE   GILAD DUGGER HFW:[id number] DOB: 10-May-1941 DOA: 12/02/2013 PCP: Walker Kehr, MD  Brief narrative: Cameron [name] is an 73 y.o. male with a PMH of CAD status post CABG, COPD, poorly differentiated non-small cell lung cancer under the care of Dr. Julien Nordmann, receiving concurrent chemoradiation, last chemotherapy 11/22/13, who was admitted 12/03/13 with a chief complaint of fever, chills, and cough. Upon initial evaluation, he had a fever of 102, a WBC of 1.5 and an ANC of 1.1. Chest x-ray was negative for focal infiltrates.  Assessment/Plan: Principal Problem:   Neutropenic fever Patient was admitted and placed on empiric vancomycin and Levaquin. Patient does not have an implanted port, so will change his antibiotic therapy to empiric Primaxin and discontinue vancomycin and Levaquin for now. Blood cultures and urine cultures were sent. Influenza panel pending. Active Problems:   Anti-neoplastic chemotherapy induced pancytopenia Monitor counts. No current indication for PRBC or platelet transfusion. No current indication for Neupogen.   CORONARY ARTERY DISEASE Continue metoprolol. We'll need to clarify why he is not taking aspirin daily.   COPD GOLD II Continue bronchodilators as needed and prednisone 10 mg daily.   HTN (hypertension) Continue metoprolol.   Non-small cell cancer of left lung We'll notify Dr. Julien Nordmann of the patient's admission.   DVT Prophylaxis Start Lovenox.  Code Status: Full. Family Communication: Wife updated at bedside. Disposition Plan: Home when stable.   IV access:  Peripheral IV  Medical Consultants:  None  Other Consultants:  None  Anti-infectives:  Vancomycin 12/03/13---> 12/03/13  Levaquin 12/03/13---> 12/03/13  Primaxin 12/03/13--->  HPI/Subjective: Cameron [name] has a cough, but no myalgias, nausea, vomiting, abdominal pain, diarrhea, or sore throat.   Objective: Filed Vitals:   12/02/13 2212  12/03/13 0217 12/03/13 0440  BP: 112/72 125/74 139/74  Pulse: 110 103 97  Temp: 99.1 F (37.3 C) 99 F (37.2 C) 98.3 F (36.8 C)  TempSrc: Oral Oral Oral  Resp: 18 18   Height:   5\' 8"  (1.727 m)  Weight:   59.6 kg (131 lb 6.3 oz)  SpO2: 95% 93% 97%   No intake or output data in the 24 hours ending 12/03/13 0817  Exam: Gen:  NAD Cardiovascular:  Tachycardic, No M/R/G Respiratory:  Lungs diminished, mostly clear with scattered faint rhonchi Gastrointestinal:  Abdomen soft, NT/ND, + BS Extremities:  No C/E/C  Data Reviewed: Basic Metabolic Panel:  Recent Labs Lab 11/28/13 1320 12/02/13 2330 12/03/13 0609  NA 136 136* 135*  K 4.5 4.0 3.7  CL  --  97 97  CO2 28 27 25   GLUCOSE 122 108* 92  BUN 16.8 14 12   CREATININE 1.1 1.05 0.92  CALCIUM 9.2 8.4 7.7*   GFR Estimated Creatinine Clearance: 62.1 ml/min (by C-G formula based on Cr of 0.92). Liver Function Tests:  Recent Labs Lab 11/28/13 1320 12/02/13 2330  AST 13 18  ALT 15 13  ALKPHOS 56 52  BILITOT 0.51 0.6  PROT 5.8* 5.8*  ALBUMIN 2.8* 2.9*   CBC:  Recent Labs Lab 11/28/13 1320 12/02/13 2330 12/03/13 0609  WBC 3.7* 1.5* 1.3*  NEUTROABS 2.8 1.1*  --   HGB 10.7* 9.4* 8.1*  HCT 31.2* 27.0* 23.4*  MCV 95.1 94.1 94.4  PLT 128* 80* 70*   Microbiology No results found for this or any previous visit (from the past 240 hour(s)).   Procedures and Diagnostic Studies: Dg Chest 2 View  12/02/2013   CLINICAL DATA:  Cough and congestion  EXAM: CHEST  2 VIEW  COMPARISON:  10/24/2013  FINDINGS: There is is stable mass lesion identified in the left lower lobe superimposed over the spine. Postsurgical changes are again seen. The lungs are hyperinflated. No focal infiltrate or sizable effusion is seen.  IMPRESSION: Stable left lower lobe mass lesion. No new focal abnormality is seen.   Electronically Signed   By: Inez Catalina M.D.   On: 12/02/2013 15:24    Scheduled Meds: . [START ON 12/04/2013] levofloxacin  (LEVAQUIN) IV  500 mg Intravenous Q24H  . metoprolol succinate  12.5 mg Oral QHS  . metoprolol succinate  25 mg Oral Daily  . pantoprazole  40 mg Oral Daily  . predniSONE  10 mg Oral Q breakfast  . sodium chloride  3 mL Intravenous Q12H  . tamsulosin  0.4 mg Oral Daily  . vancomycin  500 mg Intravenous Q12H   Continuous Infusions: . sodium chloride      Time spent: 30 minutes.   LOS: 1 day   RAMA,CHRISTINA  [facility] Pager [phone].   *Please note that the hospitalists switch teams on Wednesdays. Please call the flow manager at [phone] if you are having difficulty reaching the hospitalist taking care of this patient as she can update you and provide the most up-to-date pager number of provider caring for the patient. If 8PM-8AM, please contact night-coverage at [email or web address], password [email or web address]  12/03/2013, 8:17 AM   Information printed out, explained, and given to the patient:  In an effort to keep you and your family informed about your hospital stay, I am providing you with this information sheet. If you or your family have any questions, please do not hesitate to have the nursing staff page me to set up a meeting time.  Cameron [name] 12/03/2013 1 (Number of days in the hospital)  Treatment team:  Dr. Jacquelynn Cree, Hospitalist (Internist)  Dr. Julien Nordmann (cancer doctor)  Dr. Tammi Klippel (radiation doctor)  Active Treatment Issues with Plan: Principal Problem:   Fever with immune suppression from prior chemo Blood cultures and urine cultures were sent. Influenza panel pending.  On empiric antibiotics (Primaxin). Active Problems:   Low blood counts from chemo Monitor counts. Your white blood cell count is 1.3, hemoglobin 8.1 and platelet count 70 today.   CORONARY ARTERY DISEASE Continue metoprolol. Recommend daily aspirin if you are not already taking this (unless told not to by one of your physicians).   COPD  Continue bronchodilators as needed and prednisone 10  mg daily.   High blood pressure Continue metoprolol. Most recent blood pressure is: 139/74   Lung cancer We'll notify Dr. Julien Nordmann and Dr. Tammi Klippel that you are in the hospital.   Blood clot prevention Lovenox daily.  Anticipated discharge date: 1-3 days, depending on culture results, progress and when your fever breaks.

## 2013-12-03 NOTE — Progress Notes (Signed)
ANTIBIOTIC CONSULT NOTE - INITIAL  Pharmacy Consult for Vancomycin Indication: Febrile Neutropenia  Allergies  Allergen Reactions  . Pravastatin Other (See Comments)    CK>2000, muscle weakness  . Rosuvastatin Other (See Comments)    REACTION:muscle aches  . Zolpidem Other (See Comments)    confusion  . Augmentin [Amoxicillin-Pot Clavulanate] Rash    Patient Measurements: Height: 5\' 8"  (172.7 cm) Weight: 131 lb 6.3 oz (59.6 kg) IBW/kg (Calculated) : 68.4  Vital Signs: Temp: 98.3 F (36.8 C) (01/31 0440) Temp src: Oral (01/31 0440) BP: 139/74 mmHg (01/31 0440) Pulse Rate: 97 (01/31 0440) Intake/Output from previous day:   Intake/Output from this shift:    Labs:  Recent Labs  12/02/13 2330  WBC 1.5*  HGB 9.4*  PLT 80*  CREATININE 1.05   Estimated Creatinine Clearance: 54.4 ml/min (by C-G formula based on Cr of 1.05). No results found for this basename: Letta Median, VANCORANDOM, [place], [place], [place], [place], TOBRAPEAK, TOBRARND, AMIKACINPEAK, AMIKACINTROU, AMIKACIN,  in the last 72 hours   Microbiology: Recent Results (from the past 720 hour(s))  TECHNOLOGIST REVIEW     Status: None   Collection Time    11/07/13 10:39 AM      Result Value Range Status   Technologist Review Oc meta   Final  TECHNOLOGIST REVIEW     Status: None   Collection Time    11/14/13  9:50 AM      Result Value Range Status   Technologist Review Occ Metas and Myelocytes present   Final    Medical History: Past Medical History  Diagnosis Date  . COPD (chronic obstructive pulmonary disease)   . Asthma   . Coronary artery disease     a. s/p stenting to the RCA with subsequent CABGx4 (01/2005)->LIMA-LAD,SVG-diagonal, SVG-om, and SVG-PDA;  b. 03/2012 MV: EF 67%, no isch/infarct.  . Hyperlipidemia   . Osteoarthritis   . GERD (gastroesophageal reflux disease)   . Low back pain   . Non-small cell carcinoma of left lung 10/2013 dx    a. chemoradiation  .  Hypertension   . Nephrolithiasis     x1  . 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 [START ON 12/04/2013] levofloxacin (LEVAQUIN) IV  500 mg Intravenous Q24H  . metoprolol succinate  12.5 mg Oral QHS  . metoprolol succinate  25 mg Oral Daily  . pantoprazole  40 mg Oral Daily  . predniSONE  10 mg Oral Q breakfast  . sodium chloride  3 mL Intravenous Q12H  . tamsulosin  0.4 mg Oral Daily   Infusions:  . sodium chloride     Assessment:  73 yr male currently undergoing chemotherapy and radiation for lunch cancer.  Reported home temperature of 102F.  Currently Tmax at hospital = 99.1 F, WBC = 1.5, Scr = 1.05 with CrCl ~ 54 ml/min  Levaquin (MD dosing) and Vancomycin per pharmacy ordered for febrile neutropenia  Pt received Vancomycin 1gm IV x 1 in ED @ 02:28  Goal of Therapy:  Vancomycin trough level 15-20 mcg/ml  Plan:  Measure antibiotic drug levels at steady state Follow up culture results Vancomycin 500mg  IV q12h  Emanuell Morina, Toribio Harbour, PharmD 12/03/2013,4:46 AM

## 2013-12-03 NOTE — Progress Notes (Signed)
Utilization Review Completed.   Ziaire Bieser, RN, BSN Nurse Case Manager  

## 2013-12-03 NOTE — ED Provider Notes (Signed)
73 year old male being treated for lung cancer with radiation therapy and chemotherapy comes in with fever to 102 at home. Has a slight increase in his baseline cough. Cough is productive of some clear sputum. There's been no nausea vomiting and no abdominal pain. No dysuria. On exam, he is actually afebrile here but appears mildly toxic. Vital signs have been normal and no evidence of hypotension or tachycardia. WBC is 1.5 with ANC of 1.1 but there is note made of toxic granulation and increased bands. While he is not severely neutropenic, he does appear to be ill and will need to be admitted for IV antibiotics. Most likely his source is pneumonia at all even with no change seen on his chest x-ray.  Medical screening examination/treatment/procedure(s) were conducted as a shared visit with non-physician practitioner(s) and myself.  I personally evaluated the patient during the encounter.    Delora Fuel, MD 70/35/00 9381

## 2013-12-03 NOTE — H&P (Signed)
[facility] History and Physical  Cameron [name] DOB: 1941-05-28 DOA: 12/02/2013  Referring physician:  PCP: Walker Kehr, MD  Specialists:   Chief Complaint:  Fever Chills and cough  HPI: Cameron [name] is a 73 y.o. male with a history of [place] Lung Cancer of left Lung  Diagnosed in 09/2013 currently on Chemotherapy and Radiation Therapy who presents to the ED with complaints of fevers chills rigor and increased coughing for the past 2-3 days.   He reports coughing up whitish sputum.   At home he reported having a temperature of 102.0.   In the ED, he was evaluated and found to have a WBC count of 1.5 with an ANS =1.1. He was placed on Neutropenic precautions and blood cultures were sent, and he was placed on IV antibiotic Rx of Vancomycin and Levaquin.        Review of Systems:  Constitutional: No weight loss, night sweats, +Fevers, +Chills, Fatigue or Generalized Weakness HEENT: No headaches, Difficulty swallowing,Tooth/dental problems,Sore throat,  No sneezing, itching, ear ache, nasal congestion, post nasal drip,  Cardio-vascular:  No chest pain, Orthopnea, PND, Edema in lower extremities, Anasarca, dizziness, palpitations  Resp: +SOB, DOE. No excess mucus, no productive cough, No non-productive cough, No hemoptysis, No change in color of mucus.No wheezing.No chest wall deformity GI: No heartburn, indigestion, abdominal pain, nausea, vomiting, diarrhea, change in bowel habits, loss of appetite  GU: no dysuria, change in color of urine, no urgency or frequency. No flank pain.  Musculoskeletal: No joint pain or swelling. No decreased range of motion. No back pain.  Neurologic: No syncope, No Seizures, Muscle Weakness, Paresthesia, Vision disturbance or Loss, No Diplopia, No Vertigo, No Difficulty Walking,  Skin: no rash or lesions. Psych: No change in mood or affect. No depression or anxiety. No memory loss. No confusion or hallucinations   Past Medical History   Diagnosis Date  . COPD (chronic obstructive pulmonary disease)   . Asthma   . Coronary artery disease     a. s/p stenting to the RCA with subsequent CABGx4 (01/2005)->LIMA-LAD,SVG-diagonal, SVG-om, and SVG-PDA;  b. 03/2012 MV: EF 67%, no isch/infarct.  . Hyperlipidemia   . Osteoarthritis   . GERD (gastroesophageal reflux disease)   . Low back pain   . Non-small cell carcinoma of left lung 10/2013 dx    a. chemoradiation  . Hypertension   . Nephrolithiasis     x1  . 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 Appendectomy    . Hemorrhoid surgery    . Video bronchoscopy Bilateral 09/21/2013    Procedure: VIDEO BRONCHOSCOPY WITHOUT FLUORO;  Surgeon: Tanda Rockers, MD;  Location: [facility];  Service: Cardiopulmonary;  Laterality: Bilateral;  . Coronary artery bypass graft      ,stent place prior-no problems now  . Endobronchial ultrasound Bilateral 10/17/2013    Procedure: ENDOBRONCHIAL ULTRASOUND;  Surgeon: Collene Gobble, MD;  Location: [facility];  Service: Cardiopulmonary;  Laterality: Bilateral;    Prior to Admission medications   Medication Sig Start Date End Date Taking? Authorizing Provider  albuterol (PROVENTIL HFA;VENTOLIN HFA) 108 (90 BASE) MCG/ACT inhaler Inhale 2 puffs into the lungs every 6 (six) hours as needed for wheezing or shortness of breath. 10/24/13  Yes Rowe Clack, MD  budesonide-formoterol [facility]) 160-4.5 MCG/ACT inhaler Inhale 2 puffs into the lungs 2 (two) times daily.   Yes Historical Provider, MD  cholecalciferol (  VITAMIN D) 1000 UNITS tablet Take 1,000 Units by mouth every morning.   Yes Historical Provider, MD  clonazePAM (KLONOPIN) 0.5 MG tablet Take 0.25 mg by mouth at bedtime as needed (sleep). As needed for sleep   Yes Historical Provider, MD  diphenhydrAMINE (BENADRYL) 25 MG tablet Take 25 mg by mouth every 6 (six) hours as needed for allergies.    Yes Historical  Provider, MD  metoprolol succinate (TOPROL-XL) 25 MG 24 hr tablet Take 12.5-25 mg by mouth 2 (two) times daily. Take 25mg  in the morning and 12.5mg  at night   Yes Historical Provider, MD  nitroGLYCERIN (NITROSTAT) 0.4 MG SL tablet Place 0.4 mg under the tongue every 5 (five) minutes as needed for chest pain. x3 doses as needed for chest pain 03/15/12  Yes Minus Breeding, MD  omeprazole (PRILOSEC) 20 MG capsule Take 20 mg by mouth every morning.    Yes Historical Provider, MD  predniSONE (DELTASONE) 10 MG tablet Take 10 mg by mouth daily with breakfast.   Yes Historical Provider, MD  prochlorperazine (COMPAZINE) 10 MG tablet Take 1 tablet (10 mg total) by mouth every 6 (six) hours as needed for nausea or vomiting. 10/21/13  Yes Curt Bears, MD  promethazine-codeine (PHENERGAN WITH CODEINE) 6.25-10 MG/5ML syrup Take 5 mLs by mouth every 4 (four) hours as needed for cough. 12/02/13  Yes Lora Paula, MD  sucralfate (CARAFATE) 1 G tablet Take 1 tablet (1 g total) by mouth 4 (four) times daily -  with meals and at bedtime. Grind into a glass of water, 5 minutes before meals 11/25/13  Yes Lora Paula, MD  Tamsulosin HCl (FLOMAX) 0.4 MG CAPS Take 0.4 mg by mouth daily.    Yes Historical Provider, MD  traMADol (ULTRAM) 50 MG tablet Take 50 mg by mouth every 6 (six) hours as needed for moderate pain.   Yes Historical Provider, MD  triamcinolone cream (KENALOG) 0.5 % Apply 1 application topically 3 (three) times daily as needed (rash).   Yes Historical Provider, MD  hyaluronate sodium (RADIAPLEXRX) GEL Apply 1 application topically 2 (two) times daily as needed (for skin irritation due to radiation treatment).     Historical Provider, MD     Allergies  Allergen Reactions  . Pravastatin Other (See Comments)    CK>2000, muscle weakness  . Rosuvastatin Other (See Comments)    REACTION:muscle aches  . Zolpidem Other (See Comments)    confusion  . Augmentin [Amoxicillin-Pot Clavulanate] Rash      Social History:   Married  reports that he quit smoking about 10 years ago. His smoking use included Cigarettes. He has a 40 pack-year smoking history. He has never used smokeless tobacco. He reports that he does not drink alcohol or use illicit drugs.     Family History  Problem Relation Age of Onset  . Heart attack Mother   . Diabetes Mother   . Heart attack Father   . Heart attack Brother   . Hypertension Other   . Colon cancer Neg Hx   . Emphysema Brother     smoked       Physical Exam:  GEN:  Pleasant Well Nourished and Well developed  73 y.o. Caucasian male  examined  and in no acute distress; cooperative with exam Filed Vitals:   12/02/13 2212 12/03/13 0217  BP: 112/72 125/74  Pulse: 110 103  Temp: 99.1 F (37.3 C) 99 F (37.2 C)  TempSrc: Oral Oral  Resp: 18 18  SpO2: 95%  93%   Blood pressure 125/74, pulse 103, temperature 99 F (37.2 C), temperature source Oral, resp. rate 18, SpO2 93.00%. PSYCH: He is alert and oriented x4; does not appear anxious does not appear depressed; affect is normal HEENT: Normocephalic and Atraumatic, Mucous membranes pink; PERRLA; EOM intact; Fundi:  Benign;  No scleral icterus, Nares: Patent, Oropharynx: Clear, Fair Dentition, Neck:  FROM, no cervical lymphadenopathy nor thyromegaly or carotid bruit; no JVD; Breasts:: Not examined CHEST WALL: No tenderness CHEST: Normal respiration, clear to auscultation bilaterally HEART: Regular rate and rhythm; no murmurs rubs or gallops BACK: No kyphosis or scoliosis; no CVA tenderness ABDOMEN: Positive Bowel Sounds, soft non-tender; no masses, no organomegaly.   Rectal Exam: Not done EXTREMITIES: No cyanosis, clubbing or edema; no ulcerations. Genitalia: not examined PULSES: 2+ and symmetric SKIN: Normal hydration no rash or ulceration CNS: Neurologic Examination:  Vascular: pulses palpable throughout    Labs on Admission:  Basic Metabolic Panel:  Recent Labs Lab 11/28/13 1320  12/02/13 2330  NA 136 136*  K 4.5 4.0  CL  --  97  CO2 28 27  GLUCOSE 122 108*  BUN 16.8 14  CREATININE 1.1 1.05  CALCIUM 9.2 8.4   Liver Function Tests:  Recent Labs Lab 11/28/13 1320 12/02/13 2330  AST 13 18  ALT 15 13  ALKPHOS 56 52  BILITOT 0.51 0.6  PROT 5.8* 5.8*  ALBUMIN 2.8* 2.9*   No results found for this basename: LIPASE, AMYLASE,  in the last 168 hours No results found for this basename: AMMONIA,  in the last 168 hours CBC:  Recent Labs Lab 11/28/13 1320 12/02/13 2330  WBC 3.7* 1.5*  NEUTROABS 2.8 1.1*  HGB 10.7* 9.4*  HCT 31.2* 27.0*  MCV 95.1 94.1  PLT 128* 80*   Cardiac Enzymes: No results found for this basename: CKTOTAL, CKMB, CKMBINDEX, TROPONINI,  in the last 168 hours  BNP (last 3 results) No results found for this basename: PROBNP,  in the last 8760 hours CBG: No results found for this basename: GLUCAP,  in the last 168 hours  Radiological Exams on Admission: Dg Chest 2 View  12/02/2013   CLINICAL DATA:  Cough and congestion  EXAM: CHEST  2 VIEW  COMPARISON:  10/24/2013  FINDINGS: There is is stable mass lesion identified in the left lower lobe superimposed over the spine. Postsurgical changes are again seen. The lungs are hyperinflated. No focal infiltrate or sizable effusion is seen.  IMPRESSION: Stable left lower lobe mass lesion. No new focal abnormality is seen.   Electronically Signed   By: Inez Catalina M.D.   On: 12/02/2013 15:24       Assessment/Plan:   73 y.o. male with  Principal Problem:   Neutropenic fever Active Problems:   HYPERLIPIDEMIA   CORONARY ARTERY DISEASE   COPD GOLD II   HTN (hypertension)   Non-small cell cancer of left lung     1.   Neutropenic Fever- Cultures Sent and placed on IV Vancomycin and Levaquin.    Droplet Isolation.  Albuterol Nebs and O2.     2.  NSC  Lung Cancer of Left Lung-   On Chemo and Radiation Rx, Notify Oncology in AM.      3.   COPD-   Albuterol Nebs, O2 PRN.   Hold Symbicort  and Albuterol Inhaler for now.     4.   CAD- stable.  Continue Metoprolol RX.     5.   HTN-  On Metoprolol Rx.  Monitor BPs.    6.  Hyperlipidemia- check lipids.     7.  DVT Prophylaxis Lovenox.       Code Status:   FULL CODE  Family Communication:    Wife at Bedside Disposition Plan:    Inpatient  Time spent:  [facility] Pager [phone]  If 7PM-7AM, please contact night-coverage [email or web address] Password [email or web address] 12/03/2013, 4:29 AM

## 2013-12-03 NOTE — Progress Notes (Signed)
ANTIBIOTIC CONSULT NOTE - INITIAL  Pharmacy Consult for imipenem Indication: Febrile Neutropenia  Allergies  Allergen Reactions  . Pravastatin Other (See Comments)    CK>2000, muscle weakness  . Rosuvastatin Other (See Comments)    REACTION:muscle aches  . Zolpidem Other (See Comments)    confusion  . Augmentin [Amoxicillin-Pot Clavulanate] Rash    Patient Measurements: Height: 5\' 8"  (172.7 cm) Weight: 131 lb 6.3 oz (59.6 kg) IBW/kg (Calculated) : 68.4  Vital Signs: Temp: 98.3 F (36.8 C) (01/31 0440) Temp src: Oral (01/31 0440) BP: 139/74 mmHg (01/31 0440) Pulse Rate: 97 (01/31 0440) Intake/Output from previous day:   Intake/Output from this shift:    Labs:  Recent Labs  12/02/13 2330 12/03/13 0609  WBC 1.5* 1.3*  HGB 9.4* 8.1*  PLT 80* 70*  CREATININE 1.05 0.92   Estimated Creatinine Clearance: 62.1 ml/min (by C-G formula based on Cr of 0.92). No results found for this basename: Letta Median, VANCORANDOM, [place], [place], [place], Jessie, TOBRAPEAK, TOBRARND, AMIKACINPEAK, AMIKACINTROU, AMIKACIN,  in the last 72 hours   Microbiology: Recent Results (from the past 720 hour(s))  TECHNOLOGIST REVIEW     Status: None   Collection Time    11/07/13 10:39 AM      Result Value Range Status   Technologist Review Oc meta   Final  TECHNOLOGIST REVIEW     Status: None   Collection Time    11/14/13  9:50 AM      Result Value Range Status   Technologist Review Occ Metas and Myelocytes present   Final    Medical History: Past Medical History  Diagnosis Date  . COPD (chronic obstructive pulmonary disease)   . Asthma   . Coronary artery disease     a. s/p stenting to the RCA with subsequent CABGx4 (01/2005)->LIMA-LAD,SVG-diagonal, SVG-om, and SVG-PDA;  b. 03/2012 MV: EF 67%, no isch/infarct.  . Hyperlipidemia   . Osteoarthritis   . GERD (gastroesophageal reflux disease)   . Low back pain   . Non-small cell carcinoma of left lung 10/2013 dx     a. chemoradiation  . Hypertension   . Nephrolithiasis     x1  . 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 enoxaparin (LOVENOX) injection  40 mg Subcutaneous Q24H  . metoprolol succinate  12.5 mg Oral QHS  . metoprolol succinate  25 mg Oral Daily  . pantoprazole  40 mg Oral Daily  . predniSONE  10 mg Oral Q breakfast  . sodium chloride  3 mL Intravenous Q12H  . tamsulosin  0.4 mg Oral Daily   Infusions:  . sodium chloride     Assessment: 73 yr male currently undergoing chemotherapy (carbo/paclitaxel) and radiation for lung cancer.  Reported home temperature of 102F. Last chemo was 1/26  1/31 >>Levaquin >> 1/31 1/31 >>Vanc >> 1/31 1/31 >> imipenem >>  Tmax: 99.72F WBCs: 1.3 (1/30 ANC = 1100), WBC trending down Renal:Scr = 0.92 with CrCl ~ 62 ml/min  1/31 blood x 2: pending 1/31 urine: pending  Goal of Therapy:  Appropriately dose for renal function  Plan:   Imipenem 250mg  IV q6h based on weight and CrCl  Agree vancomycin not needed if no CRBSI, skin/soft tissue infection, pneumonia, and hemodynamic instability  Doreene Eland, PharmD, BCPS.   Pager: [phone] 12/03/2013,8:39 AM

## 2013-12-04 DIAGNOSIS — E43 Unspecified severe protein-calorie malnutrition: Secondary | ICD-10-CM | POA: Diagnosis present

## 2013-12-04 DIAGNOSIS — D61810 Antineoplastic chemotherapy induced pancytopenia: Secondary | ICD-10-CM

## 2013-12-04 DIAGNOSIS — T451X5A Adverse effect of antineoplastic and immunosuppressive drugs, initial encounter: Secondary | ICD-10-CM

## 2013-12-04 LAB — CBC WITH DIFFERENTIAL/PLATELET
Basophils Absolute: 0 10*3/uL (ref 0.0–0.1)
Basophils Relative: 0 % (ref 0–1)
EOS PCT: 3 % (ref 0–5)
Eosinophils Absolute: 0.1 10*3/uL (ref 0.0–0.7)
HEMATOCRIT: 23 % — AB (ref 39.0–52.0)
HEMOGLOBIN: 8 g/dL — AB (ref 13.0–17.0)
LYMPHS ABS: 0.3 10*3/uL — AB (ref 0.7–4.0)
Lymphocytes Relative: 20 % (ref 12–46)
MCH: 32.5 pg (ref 26.0–34.0)
MCHC: 34.8 g/dL (ref 30.0–36.0)
MCV: 93.5 fL (ref 78.0–100.0)
Monocytes Absolute: 0.2 10*3/uL (ref 0.1–1.0)
Monocytes Relative: 11 % (ref 3–12)
Neutro Abs: 1.1 10*3/uL — ABNORMAL LOW (ref 1.7–7.7)
Neutrophils Relative %: 66 % (ref 43–77)
Platelets: 73 10*3/uL — ABNORMAL LOW (ref 150–400)
RBC: 2.46 MIL/uL — AB (ref 4.22–5.81)
RDW: 13.8 % (ref 11.5–15.5)
WBC: 1.7 10*3/uL — AB (ref 4.0–10.5)

## 2013-12-04 LAB — URINE CULTURE: Colony Count: 3000

## 2013-12-04 MED ORDER — BOOST PLUS PO LIQD
237.0000 mL | Freq: Two times a day (BID) | ORAL | Status: DC
  Administered 2013-12-04 – 2013-12-05 (×2): 237 mL via ORAL
  Filled 2013-12-04 (×7): qty 237

## 2013-12-04 MED ORDER — CLONAZEPAM 0.125 MG PO TBDP
0.2500 mg | ORAL_TABLET | Freq: Every evening | ORAL | Status: DC | PRN
  Administered 2013-12-04 – 2013-12-12 (×8): 0.25 mg via ORAL
  Filled 2013-12-04 (×9): qty 2

## 2013-12-04 MED ORDER — TRAMADOL HCL 50 MG PO TABS
50.0000 mg | ORAL_TABLET | Freq: Four times a day (QID) | ORAL | Status: DC | PRN
  Administered 2013-12-04 – 2013-12-10 (×4): 50 mg via ORAL
  Filled 2013-12-04 (×4): qty 1

## 2013-12-04 NOTE — Progress Notes (Signed)
[facility] PROGRESS NOTE   Cameron [name] YNW:[id number] DOB: 1941-10-14 DOA: 12/02/2013 PCP: Walker Kehr, MD  Brief narrative: Cameron [name] is an 73 y.o. male with a PMH of CAD status post CABG, COPD, poorly differentiated non-small cell lung cancer under the care of Dr. Julien Nordmann, receiving concurrent chemoradiation, last chemotherapy 11/22/13, who was admitted 12/03/13 with a chief complaint of fever, chills, and cough. Upon initial evaluation, he had a fever of 102, a WBC of 1.5 and an ANC of 1.1. Chest x-ray was negative for focal infiltrates.  Assessment/Plan: Principal Problem:   Neutropenic fever Patient was admitted and placed on empiric vancomycin and Levaquin. Patient does not have an implanted port, so will change his antibiotic therapy to empiric Primaxin and discontinue vancomycin and Levaquin for now. Blood cultures and urine cultures were sent. Influenza panel negative. Active Problems:   Anti-neoplastic chemotherapy induced pancytopenia Monitor counts. No current indication for PRBC or platelet transfusion. No current indication for Neupogen.   CORONARY ARTERY DISEASE Continue metoprolol. Has been told by one of his treating physicians not to take aspirin, but does not know why.   COPD GOLD II Continue bronchodilators, Symbicort and prednisone 10 mg daily.   HTN (hypertension) Continue metoprolol.   Non-small cell cancer of left lung We'll notify Dr. Julien Nordmann of the patient's admission.   DVT Prophylaxis Start Lovenox.  Code Status: Full. Family Communication: Wife updated at bedside. Disposition Plan: Home when stable.   IV access:  Peripheral IV  Medical Consultants:  None  Other Consultants:  None  Anti-infectives:  Vancomycin 12/03/13---> 12/03/13  Levaquin 12/03/13---> 12/03/13  Primaxin 12/03/13--->  HPI/Subjective: Cameron [name] has ongoing complaints of cough, but no myalgias, nausea, vomiting, abdominal pain, or sore throat.    Appetite is poor, but ate a better breakfast this am.  Had a small diarrheal stool.  Objective: Filed Vitals:   12/03/13 2052 12/04/13 0137 12/04/13 0454 12/04/13 1006  BP: 140/82 121/66 142/96   Pulse: 95 89 102   Temp: 98.2 F (36.8 C) 98.2 F (36.8 C) 97.1 F (36.2 C)   TempSrc: Oral Oral Oral   Resp: 18 19 21    Height:      Weight:      SpO2: 95% 97% 98% 94%    Intake/Output Summary (Last 24 hours) at 12/04/13 1107 Last data filed at 12/04/13 6578  Gross per 24 hour  Intake    995 ml  Output    100 ml  Net    895 ml    Exam: Gen:  NAD Cardiovascular:  Tachycardic, No M/R/G Respiratory:  Lungs diminished, clear today Gastrointestinal:  Abdomen soft, NT/ND, + BS Extremities:  No C/E/C  Data Reviewed: Basic Metabolic Panel:  Recent Labs Lab 11/28/13 1320 12/02/13 2330 12/03/13 0609  NA 136 136* 135*  K 4.5 4.0 3.7  CL  --  97 97  CO2 28 27 25   GLUCOSE 122 108* 92  BUN 16.8 14 12   CREATININE 1.1 1.05 0.92  CALCIUM 9.2 8.4 7.7*   GFR Estimated Creatinine Clearance: 62.1 ml/min (by C-G formula based on Cr of 0.92). Liver Function Tests:  Recent Labs Lab 11/28/13 1320 12/02/13 2330  AST 13 18  ALT 15 13  ALKPHOS 56 52  BILITOT 0.51 0.6  PROT 5.8* 5.8*  ALBUMIN 2.8* 2.9*   CBC:  Recent Labs Lab 11/28/13 1320 12/02/13 2330 12/03/13 0609 12/04/13 0836  WBC 3.7* 1.5* 1.3* 1.7*  NEUTROABS 2.8 1.1*  --  1.1*  HGB 10.7* 9.4* 8.1* 8.0*  HCT 31.2* 27.0* 23.4* 23.0*  MCV 95.1 94.1 94.4 93.5  PLT 128* 80* 70* 73*   Microbiology No results found for this or any previous visit (from the past 240 hour(s)).   Procedures and Diagnostic Studies: Dg Chest 2 View  12/02/2013   CLINICAL DATA:  Cough and congestion  EXAM: CHEST  2 VIEW  COMPARISON:  10/24/2013  FINDINGS: There is is stable mass lesion identified in the left lower lobe superimposed over the spine. Postsurgical changes are again seen. The lungs are hyperinflated. No focal infiltrate or  sizable effusion is seen.  IMPRESSION: Stable left lower lobe mass lesion. No new focal abnormality is seen.   Electronically Signed   By: Inez Catalina M.D.   On: 12/02/2013 15:24    Scheduled Meds: . budesonide-formoterol  2 puff Inhalation BID  . enoxaparin (LOVENOX) injection  40 mg Subcutaneous Daily  . imipenem-cilastatin  250 mg Intravenous Q6H  . metoprolol succinate  12.5 mg Oral QHS  . metoprolol succinate  25 mg Oral Daily  . pantoprazole  40 mg Oral Daily  . predniSONE  10 mg Oral Q breakfast  . sodium chloride  3 mL Intravenous Q12H  . tamsulosin  0.4 mg Oral Daily   Continuous Infusions: . sodium chloride 75 mL/hr at 12/03/13 2040    Time spent: 25 minutes.   LOS: 2 days   RAMA,CHRISTINA  [facility] Pager [phone].   *Please note that the hospitalists switch teams on Wednesdays. Please call the flow manager at [phone] if you are having difficulty reaching the hospitalist taking care of this patient as she can update you and provide the most up-to-date pager number of provider caring for the patient. If 8PM-8AM, please contact night-coverage at [email or web address], password [facility]  12/04/2013, 11:07 AM   Information printed out, explained, and given to the patient:  In an effort to keep you and your family informed about your hospital stay, I am providing you with this information sheet. If you or your family have any questions, please do not hesitate to have the nursing staff page me to set up a meeting time.  Cameron [name] 12/04/2013 2 (Number of days in the hospital)  Treatment team:  Dr. Jacquelynn Cree, Hospitalist (Internist)  Dr. Julien Nordmann (cancer doctor)  Dr. Tammi Klippel (radiation doctor)  Active Treatment Issues with Plan: Principal Problem:   Fever with immune suppression from prior chemo Blood cultures and urine cultures were sent. Influenza panel negative (no flu).  On empiric antibiotics (Primaxin).  No fevers documented over night. Active  Problems:   Low blood counts from chemo Your white blood cell count is 1.7, hemoglobin 8.0 and platelet count 73 today.      CORONARY ARTERY DISEASE Continue metoprolol.    COPD  Continue symbicort and prednisone 10 mg daily.  You may also have an albuterol nebulizer treatment every 6 hours as needed (ask the nurse for a treatment if you are short of breath or are wheezing).   High blood pressure Continue metoprolol. Most recent blood pressure is: 142/96.   Lung cancer We'll notify Dr. Julien Nordmann and Dr. Tammi Klippel that you are in the hospital.   Blood clot prevention Lovenox daily.  Anticipated discharge date: 1-2 days, depending on culture results.

## 2013-12-04 NOTE — Progress Notes (Signed)
INITIAL NUTRITION ASSESSMENT  DOCUMENTATION CODES Per approved criteria  -Severe malnutrition in the context of chronic illness   INTERVENTION: 1. Boost shakes BID, 8 oz provides 350 kcal, 13 g protein 2. Encourage adequate PO intake  NUTRITION DIAGNOSIS: Inadequate oral intake related to chemotherapy as evidenced by 5% intake.   Goal: Patient will meet >/=90% of estimated nutrition needs  Monitor:  PO intake, weight, labs  Reason for Assessment: Malnutrition screening tool  73 y.o. male  Admitting Dx: Neutropenic fever  ASSESSMENT: Patient with history of CAD s/p CABG, COPD, and non-small cell lung cancer, currently receiving chemotherapy and radiation. He was admitted with neutropenic fever. He reports that his intake has been poor for months, due to cancer treatments since November 2014. However, intake was declining several months prior to diagnosis. Intake is improved today, but still only about 5-10% of meals. He does drink Boost shakes at home. He has lost 14 pounds in the last 3 months (10% UBW).   Patient meets the criteria for severe MALNUTRITION in the context of chronic illness with 10% weight loss in 3 months and PO intake <75% of estimated needs.   Height: Ht Readings from Last 1 Encounters:  12/03/13 5\' 8"  (1.727 m)    Weight: Wt Readings from Last 1 Encounters:  12/03/13 131 lb 6.3 oz (59.6 kg)    Ideal Body Weight: 154 pounds  % Ideal Body Weight: 85%  Wt Readings from Last 10 Encounters:  12/03/13 131 lb 6.3 oz (59.6 kg)  11/28/13 130 lb 12.8 oz (59.33 kg)  11/25/13 131 lb 6.4 oz (59.603 kg)  11/16/13 134 lb 4.8 oz (60.918 kg)  11/15/13 134 lb 3.2 oz (60.873 kg)  11/14/13 132 lb 3.2 oz (59.966 kg)  11/11/13 133 lb 14.4 oz (60.737 kg)  10/28/13 137 lb 1.6 oz (62.188 kg)  10/21/13 137 lb 9.6 oz (62.415 kg)  10/19/13 142 lb (64.411 kg)    Usual Body Weight: 145 pounds (November 2014)  % Usual Body Weight: 90%  BMI:  Body mass index is 19.98  kg/(m^2). Patient is normal weight.   Estimated Nutritional Needs: Kcal: 1750-1850 kcal Protein: 70-85 g Fluid: >1.8 L/day  Skin: Intact  Diet Order: General  EDUCATION NEEDS: -No education needs identified at this time   Intake/Output Summary (Last 24 hours) at 12/04/13 1319 Last data filed at 12/04/13 2536  Gross per 24 hour  Intake    895 ml  Output    100 ml  Net    795 ml    Last BM: PTA   Labs:   Recent Labs Lab 11/28/13 1320 12/02/13 2330 12/03/13 0609  NA 136 136* 135*  K 4.5 4.0 3.7  CL  --  97 97  CO2 28 27 25   BUN 16.8 14 12   CREATININE 1.1 1.05 0.92  CALCIUM 9.2 8.4 7.7*  GLUCOSE 122 108* 92    CBG (last 3)  No results found for this basename: GLUCAP,  in the last 72 hours  Scheduled Meds: . budesonide-formoterol  2 puff Inhalation BID  . enoxaparin (LOVENOX) injection  40 mg Subcutaneous Daily  . imipenem-cilastatin  250 mg Intravenous Q6H  . metoprolol succinate  12.5 mg Oral QHS  . metoprolol succinate  25 mg Oral Daily  . pantoprazole  40 mg Oral Daily  . predniSONE  10 mg Oral Q breakfast  . sodium chloride  3 mL Intravenous Q12H  . tamsulosin  0.4 mg Oral Daily    Continuous Infusions: .  sodium chloride 75 mL/hr (12/04/13 1256)    Past Medical History  Diagnosis Date  . COPD (chronic obstructive pulmonary disease)   . Asthma   . Coronary artery disease     a. s/p stenting to the RCA with subsequent CABGx4 (01/2005)->LIMA-LAD,SVG-diagonal, SVG-om, and SVG-PDA;  b. 03/2012 MV: EF 67%, no isch/infarct.  . Hyperlipidemia   . Osteoarthritis   . GERD (gastroesophageal reflux disease)   . Low back pain   . Non-small cell carcinoma of left lung 10/2013 dx    a. chemoradiation  . Hypertension   . Nephrolithiasis     x1  . 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 Appendectomy    . Hemorrhoid surgery    . Video bronchoscopy Bilateral 09/21/2013     Procedure: VIDEO BRONCHOSCOPY WITHOUT FLUORO;  Surgeon: Tanda Rockers, MD;  Location: [facility];  Service: Cardiopulmonary;  Laterality: Bilateral;  . Coronary artery bypass graft      ,stent place prior-no problems now  . Endobronchial ultrasound Bilateral 10/17/2013    Procedure: ENDOBRONCHIAL ULTRASOUND;  Surgeon: Collene Gobble, MD;  Location: [facility];  Service: Cardiopulmonary;  Laterality: Bilateral;    Larey Seat, RD, LDN Pager #: [phone] After-Hours Pager #: [phone]

## 2013-12-05 ENCOUNTER — Other Ambulatory Visit: Payer: Medicare Other

## 2013-12-05 ENCOUNTER — Ambulatory Visit
Admission: RE | Admit: 2013-12-05 | Discharge: 2013-12-05 | Disposition: A | Discharge status: Home / Self Care | Payer: Medicare Other | Source: Ambulatory Visit | Attending: Radiation Oncology | Admitting: Radiation Oncology

## 2013-12-05 ENCOUNTER — Ambulatory Visit: Payer: Medicare Other

## 2013-12-05 ENCOUNTER — Other Ambulatory Visit: Payer: Self-pay

## 2013-12-05 DIAGNOSIS — D696 Thrombocytopenia, unspecified: Secondary | ICD-10-CM

## 2013-12-05 DIAGNOSIS — D649 Anemia, unspecified: Secondary | ICD-10-CM

## 2013-12-05 DIAGNOSIS — R05 Cough: Secondary | ICD-10-CM

## 2013-12-05 DIAGNOSIS — R059 Cough, unspecified: Secondary | ICD-10-CM

## 2013-12-05 LAB — CBC WITH DIFFERENTIAL/PLATELET
BASOS PCT: 0 % (ref 0–1)
Basophils Absolute: 0 10*3/uL (ref 0.0–0.1)
EOS PCT: 4 % (ref 0–5)
Eosinophils Absolute: 0.1 10*3/uL (ref 0.0–0.7)
HCT: 21.9 % — ABNORMAL LOW (ref 39.0–52.0)
HEMOGLOBIN: 7.7 g/dL — AB (ref 13.0–17.0)
LYMPHS ABS: 0.3 10*3/uL — AB (ref 0.7–4.0)
Lymphocytes Relative: 21 % (ref 12–46)
MCH: 32.9 pg (ref 26.0–34.0)
MCHC: 35.2 g/dL (ref 30.0–36.0)
MCV: 93.6 fL (ref 78.0–100.0)
Monocytes Absolute: 0.2 10*3/uL (ref 0.1–1.0)
Monocytes Relative: 15 % — ABNORMAL HIGH (ref 3–12)
NEUTROS PCT: 60 % (ref 43–77)
Neutro Abs: 1 10*3/uL — ABNORMAL LOW (ref 1.7–7.7)
Platelets: 78 10*3/uL — ABNORMAL LOW (ref 150–400)
RBC: 2.34 MIL/uL — AB (ref 4.22–5.81)
RDW: 14 % (ref 11.5–15.5)
WBC: 1.6 10*3/uL — ABNORMAL LOW (ref 4.0–10.5)

## 2013-12-05 MED ORDER — GUAIFENESIN ER 600 MG PO TB12
600.0000 mg | ORAL_TABLET | Freq: Two times a day (BID) | ORAL | Status: DC
  Administered 2013-12-05 – 2013-12-12 (×16): 600 mg via ORAL
  Filled 2013-12-05 (×18): qty 1

## 2013-12-05 MED ORDER — HYDROCOD POLST-CHLORPHEN POLST 10-8 MG/5ML PO LQCR
5.0000 mL | Freq: Two times a day (BID) | ORAL | Status: DC
  Administered 2013-12-05 – 2013-12-12 (×16): 5 mL via ORAL
  Filled 2013-12-05 (×16): qty 5

## 2013-12-05 NOTE — Progress Notes (Signed)
[place]  [phone]    HOSPITAL PROGRESS NOTE  DIAGNOSIS: Non-small cell cancer of left lung  Primary site: Lung (Left)  Staging method: AJCC 7th Edition  Clinical free text: NSCLC  Clinical: Stage IIIA (T1b, N2, M0) signed by Curt Bears, MD on 10/21/2013 9:48 AM  Pathologic free text: Negative EGFR mutation, Negative ALK gene translocation.  Summary: Stage IIIA (T1b, N2, M0)   PRIOR THERAPY: None   CURRENT THERAPY: Concurrent chemoradiation with weekly carboplatin for an AUC of 2 and paclitaxel 45 mg per meter squared for a total of 6-7 weeks according to the final dose of radiation . Last chemo given on 1/26.   CHEMOTHERAPY INTENT: Control/palliative  CURRENT # OF CHEMOTHERAPY CYCLES: 5 He was due for d1 C 6 today on 2/2  Cameron [name] 73 y.o. Male recently diagnosed stage IIIa (T1 B., N2, M0) poorly differentiated non-small cell lung cancer. He is currently undergoing a course of concurrent chemoradiation. He is scheduled to complete radiation on 12/21/2013.  He was admitted on 1/30 after presenting to the ED with  Fevers up to 102 F, chills,and  rigor and 3 day history of  Increased white, frothy productive sputum .  WBC at the ED  of 1.5 with an ANC of 1.1. He was placed on Neutropenic precautions and blood cultures were sent. Antibiotic therapy was changed to  empiric Primaxin,  and   vancomycin and Levaquin were d/c'd on  12/03/13. Blood cultures and urine cultures were sent. Influenza panel negative Chest x-ray was negative for focal infiltrates. WBC today is 1.6 with Hb 7.7  of and platelets of 78k in the setting of acute illness and recent chemo.  He is feeling better this morning, symptoms improving. We have been kindly informed of the patient's admission.    MEDICATIONS:  Scheduled Meds: . budesonide-formoterol  2 puff Inhalation BID  . enoxaparin (LOVENOX) injection  40 mg Subcutaneous Daily  . imipenem-cilastatin  250 mg  Intravenous Q6H  . lactose free nutrition  237 mL Oral BID BM  . metoprolol succinate  12.5 mg Oral QHS  . metoprolol succinate  25 mg Oral Daily  . pantoprazole  40 mg Oral Daily  . predniSONE  10 mg Oral Q breakfast  . sodium chloride  3 mL Intravenous Q12H  . tamsulosin  0.4 mg Oral Daily   Continuous Infusions: . sodium chloride 75 mL/hr at 12/05/13 0320   PRN Meds:.acetaminophen, acetaminophen, albuterol, alum & mag hydroxide-simeth, clonazepam, guaiFENesin-dextromethorphan, HYDROmorphone (DILAUDID) injection, nitroGLYCERIN, ondansetron (ZOFRAN) IV, ondansetron, oxyCODONE, promethazine-codeine, traMADol  ALLERGIES:   Allergies  Allergen Reactions  . Pravastatin Other (See Comments)    CK>2000, muscle weakness  . Rosuvastatin Other (See Comments)    REACTION:muscle aches  . Zolpidem Other (See Comments)    confusion  . Augmentin [Amoxicillin-Pot Clavulanate] Rash     PHYSICAL EXAMINATION:   Filed Vitals:   12/05/13 0516  BP: 127/72  Pulse: 83  Temp: 98.2 F (36.8 C)  Resp: 20   Filed Weights   12/03/13 0440  Weight: 131 lb 6.3 oz (59.69 kg)    73 year old  in no acute distress A. and O. x3 General well-developed and thin HEENT: Normocephalic, atraumatic, PERRLA. Oral cavity without thrush or lesions. Neck supple. no thyromegaly, no cervical or supraclavicular adenopathy  Lungs decreased breath sounds at the bases. No wheezing,  Positive rhonchi  Bilaterally, no rales. Cardiac: regular rate and rhythm,no murmur , rubs or gallops Abdomen soft nontender ,  bowel sounds x4. No HSM Extremities no clubbing cyanosis or edema. No bruising or petechial rash Neuro: non focal  LABORATORY/RADIOLOGY DATA:   Recent Labs Lab 11/28/13 1320 12/02/13 2330 12/03/13 0609 12/04/13 0836 12/05/13 0505  WBC 3.7* 1.5* 1.3* 1.7* 1.6*  HGB 10.7* 9.4* 8.1* 8.0* 7.7*  HCT 31.2* 27.0* 23.4* 23.0* 21.9*  PLT 128* 80* 70* 73* 78*  MCV 95.1 94.1 94.4 93.5 93.6  MCH 32.6 32.8 32.7  32.5 32.9  MCHC 34.3 34.8 34.6 34.8 35.2  RDW 13.6 13.8 13.8 13.8 14.0  LYMPHSABS 0.4* 0.3*  --  0.3* 0.3*  MONOABS 0.4 0.1  --  0.2 0.2  EOSABS 0.0 0.0  --  0.1 0.1  BASOSABS 0.0 0.0  --  0.0 0.0    CMP    Recent Labs Lab 11/28/13 1320 12/02/13 2330 12/03/13 0609  NA 136 136* 135*  K 4.5 4.0 3.7  CL  --  97 97  CO2 _0 GLUCOSE 122 108* 92  BUN 16._1 CREATININE 1.1 1.05 0.92  CALCIUM 9.2 8.4 7.7*  AST 13 18  --   ALT 15 13  --   ALKPHOS 56 52  --   BILITOT 0.51 0.6  --         Component Value Date/Time   BILITOT 0.6 12/02/2013 2330   BILITOT 0.51 11/28/2013 1320   BILIDIR 0.1 09/27/2013 1733        Component Value Date/Time   ESRSEDRATE 29* 09/27/2013 1733     Urinalysis    Component Value Date/Time   COLORURINE YELLOW 12/03/2013 0218   APPEARANCEUR CLEAR 12/03/2013 0218   LABSPEC 1.018 12/03/2013 0218   PHURINE 5.5 12/03/2013 0218   GLUCOSEU NEGATIVE 12/03/2013 0218   HGBUR NEGATIVE 12/03/2013 0218   BILIRUBINUR NEGATIVE 12/03/2013 0218   KETONESUR 15* 12/03/2013 0218   PROTEINUR NEGATIVE 12/03/2013 0218   UROBILINOGEN 0.2 12/03/2013 0218   NITRITE NEGATIVE 12/03/2013 0218   LEUKOCYTESUR NEGATIVE 12/03/2013 0218      Liver Function Tests:  Recent Labs Lab 11/28/13 1320 12/02/13 2330  AST 13 18  ALT 15 13  ALKPHOS 56 52  BILITOT 0.51 0.6  PROT 5.8* 5.8*  ALBUMIN 2.8* 2.9*    Radiology Studies:  Dg Chest 2 View  12/02/2013   CLINICAL DATA:  Cough and congestion  EXAM: CHEST  2 VIEW  COMPARISON:  10/24/2013  FINDINGS: There is is stable mass lesion identified in the left lower lobe superimposed over the spine. Postsurgical changes are again seen. The lungs are hyperinflated. No focal infiltrate or sizable effusion is seen.  IMPRESSION: Stable left lower lobe mass lesion. No new focal abnormality is seen.   Electronically Signed   By: Inez Catalina M.D.   On: 12/02/2013 15:24       ASSESSMENT AND PLAN:   1. Neutropenic Fever. Fever  resolved. Cultures pending. Currentlly on Maxipime. Monitor counts. WBC 1.6, with ANC of 1.0 in the setting of possible infection. Toxic granulation found in smear.   2. Anemia and thrombocytopenia,  In the setting of infection, recent chemo and dilution. no acute bleeding, continue to monitor. Would hold transfusion for now.   2. [place] Lung Cancer of Left Lung- On Chemo and Radiation  Treatments. He was due for chemo. D1 C6 today, which will be placed on hold  3. COPD- Albuterol Nebs, O2 PRN.   Other medical issues as per admitting team.   Rondel Jumbo, PA-C 12/05/2013, 8:05 AM  ADDENDUM: Hematology/Oncology Attending: The patient is seen and examined. I agree with the above note. This is a very pleasant 73 years old white male with a stage IIIa non-small cell lung cancer currently undergoing concurrent chemoradiation with reduced dose carboplatin and paclitaxel is status post 5 cycles. He was tolerating his treatment fairly well but recently admitted with neutropenic fever. The patient is feeling much better today but still complaining of increasing fatigue and weakness. He is currently on Maxipime. His total white blood count is improving slowly. His hemoglobin and hematocrit are low today and the patient may benefit from one unit of PRBCs transfusion. I would hold his chemotherapy for today but the patient can continue with his radiotherapy as scheduled. Thank you so much for taking good care of Mr. Wilczak, I will continue to follow up the patient with you and assist in his management on as needed basis.  Disclaimer: This note was dictated with voice recognition software. Similar sounding words can inadvertently be transcribed and may not be corrected upon review.

## 2013-12-05 NOTE — Evaluation (Signed)
Physical Therapy Evaluation Patient Details Name: Cameron [name] MRN: [id number] DOB: 11-02-1941 Today's Date: 12/05/2013 Time: 7628-3151 PT Time Calculation (min): 12 min  PT Assessment / Plan / Recommendation History of Present Illness   Cameron [name] is a 73 y.o. male with a history of [place] Lung Cancer of left Lung  Diagnosed in 09/2013 currently on Chemotherapy and Radiation Therapy who presents to the ED with complaints of fevers chills rigor and increased coughing for the past 2-3 days.   He reports coughing up whitish sputum.   At home he reported having a temperature of 102.0.   In the ED, he was evaluated and found to have a WBC count of 1.5 with an ANS =1.1. He was placed on Neutropenic precautions and blood cultures were sent, and he was placed on IV antibiotic Rx of Vancomycin and Levaquin  Clinical Impression  Pt ambulated x 400' in hall without AD. Gait is slow and mildly unsteady. Pt will benefit from PT to address problems listed.    PT Assessment  Patient needs continued PT services    Follow Up Recommendations  Home health PT    Does the patient have the potential to tolerate intense rehabilitation      Barriers to Discharge        Equipment Recommendations  None recommended by PT    Recommendations for Other Services     Frequency Min 3X/week    Precautions / Restrictions Precautions Precautions: Fall Precaution Comments: neutropenic   Pertinent Vitals/Pain No pain, amb on RA w/ sats 99% first 200'. sats  Registered at 88% after finishing 400' but quickly rose to 92 % on RA.      Mobility  Bed Mobility Overal bed mobility: Independent Transfers Overall transfer level: Needs assistance Equipment used: None Transfers: Sit to/from Stand Sit to Stand: Supervision Ambulation/Gait Ambulation/Gait assistance: Min guard;Min assist Ambulation Distance (Feet): 400 Feet Assistive device: None (PT steady at elbow) Gait Pattern/deviations: Step-through  pattern Gait velocity: slow Gait velocity interpretation: Below normal speed for age/gender General Gait Details: at times , some veering to side, pt corrects  self.    Exercises     PT Diagnosis: Difficulty walking;Generalized weakness  PT Problem List: Decreased strength;Decreased activity tolerance;Decreased balance;Decreased mobility;Decreased safety awareness;Decreased knowledge of precautions PT Treatment Interventions: DME instruction;Gait training;Functional mobility training;Therapeutic activities;Therapeutic exercise;Stair training;Balance training     PT Goals(Current goals can be found in the care plan section) Acute Rehab PT Goals Patient Stated Goal: To get home PT Goal Formulation: With patient/family Time For Goal Achievement: 12/19/13 Potential to Achieve Goals: Good  Visit Information  Last PT Received On: 12/05/13 Assistance Needed: +1 History of Present Illness:  Cameron [name] is a 73 y.o. male with a history of [place] Lung Cancer of left Lung  Diagnosed in 09/2013 currently on Chemotherapy and Radiation Therapy who presents to the ED with complaints of fevers chills rigor and increased coughing for the past 2-3 days.   He reports coughing up whitish sputum.   At home he reported having a temperature of 102.0.   In the ED, he was evaluated and found to have a WBC count of 1.5 with an ANS =1.1. He was placed on Neutropenic precautions and blood cultures were sent, and he was placed on IV antibiotic Rx of Vancomycin and Levaquin       Prior [place] expects to be discharged to:: Private residence Living Arrangements: Spouse/significant other Available Help at Discharge: Family Type  of Home: House Home Layout: One level Home Equipment: Walker - 2 wheels;Transport chair;Bedside commode;Tub bench Prior Function Level of Independence: Needs assistance ADL's / Homemaking Assistance Needed: wife assists Communication Communication: No  difficulties    Cognition  Cognition Arousal/Alertness: Awake/alert Behavior During Therapy: WFL for tasks assessed/performed Overall Cognitive Status: Within Functional Limits for tasks assessed    Extremity/Trunk Assessment Upper Extremity Assessment Upper Extremity Assessment: Generalized weakness Lower Extremity Assessment Lower Extremity Assessment: Generalized weakness   Balance Balance Overall balance assessment: Needs assistance Standing balance support: No upper extremity supported Standing balance-Leahy Scale: Fair  End of Session PT - End of Session Equipment Utilized During Treatment: Gait belt Activity Tolerance: Patient tolerated treatment well Patient left: in bed;with call bell/phone within reach;with family/visitor present Nurse Communication: Mobility status  GP     Claretha Cooper 12/05/2013, 6:17 PM Tresa Endo PT [phone]

## 2013-12-05 NOTE — Progress Notes (Signed)
[facility] Therapy Treatment Record Phone [phone]   Radiation Therapy was administered to Cameron [name] on: 12/05/2013  4:50 PM and was treatment #21out of a planned course of 33 treatments.

## 2013-12-05 NOTE — Progress Notes (Signed)
RN notified the PCP on call that the patient c/o chest pain after coughing a lot. EKG was performed which revealed NSR with short PR. Also, hemoglobin was 7.7 this am.  Will continue to monitor the patient.

## 2013-12-05 NOTE — Progress Notes (Signed)
Dr. Tammi Klippel notified this patient is in house, room 1441. Per Elzie Rings, RN patient does want to receive radiation today. L4 team notified.

## 2013-12-05 NOTE — Progress Notes (Addendum)
[facility] PROGRESS NOTE   EREK KOWAL EXN:[id number] DOB: 01/14/1941 DOA: 12/02/2013 PCP: Walker Kehr, MD  Brief narrative: Cameron [name] is an 73 y.o. male with a PMH of CAD status post CABG, COPD, poorly differentiated non-small cell lung cancer under the care of Dr. Julien Nordmann, receiving concurrent chemoradiation, last chemotherapy 11/22/13, who was admitted 12/03/13 with a chief complaint of fever, chills, and cough. Upon initial evaluation, he had a fever of 102, a WBC of 1.5 and an ANC of 1.1. Chest x-ray was negative for focal infiltrates.  Assessment/Plan: Principal Problem:   Neutropenic fever Patient was admitted and placed on empiric vancomycin and Levaquin. Patient does not have an implanted port, so we changed his antibiotic therapy to empiric Primaxin and discontinued vancomycin and Levaquin 12/03/13. Blood cultures and urine cultures were sent. Influenza panel negative. Active Problems:   Severe protein calorie malnutrition Seen by dietician, continue Boost.   Cough Start Tussionex and Mucinex.   Anti-neoplastic chemotherapy induced pancytopenia Monitor counts. No current indication for PRBC or platelet transfusion. No current indication for Neupogen, but if ANC drops below 1000, will order.   CORONARY ARTERY DISEASE Continue metoprolol. Has been told by one of his treating physicians not to take aspirin, but does not know why.   COPD GOLD II Continue bronchodilators, Symbicort and prednisone 10 mg daily.   HTN (hypertension) Continue metoprolol.   Non-small cell cancer of left lung We'll notify Dr. Julien Nordmann of the patient's admission.   DVT Prophylaxis Start Lovenox.  Code Status: Full. Family Communication: Wife updated at bedside. Disposition Plan: Home when stable.   IV access:  Peripheral IV  Medical Consultants:  Dr. Julien Nordmann, Oncology.  Other Consultants:  None  Anti-infectives:  Vancomycin 12/03/13---> 12/03/13  Levaquin 12/03/13--->  12/03/13  Primaxin 12/03/13--->  HPI/Subjective: Cameron [name] has ongoing complaints of cough, and had pleuritic chest pain last pm secondary to a coughing fit.   Appetite is slowly improving.  Had a small diarrheal stool again today.  Objective: Filed Vitals:   12/04/13 1816 12/04/13 2047 12/05/13 0133 12/05/13 0516  BP: 135/79 131/78 130/79 127/72  Pulse: 87 87 83 83  Temp: 97.9 F (36.6 C) 97.9 F (36.6 C) 98.4 F (36.9 C) 98.2 F (36.8 C)  TempSrc: Oral Oral Oral Oral  Resp: 20 19 20 20   Height:      Weight:      SpO2: 96% 97% 97% 99%    Intake/Output Summary (Last 24 hours) at 12/05/13 0727 Last data filed at 12/05/13 0648  Gross per 24 hour  Intake   2450 ml  Output      0 ml  Net   2450 ml    Exam: Gen:  NAD Cardiovascular:  Tachycardic, No M/R/G Respiratory:  Lungs diminished Gastrointestinal:  Abdomen soft, NT/ND, + BS Extremities:  Trace edema  Data Reviewed: Basic Metabolic Panel:  Recent Labs Lab 11/28/13 1320 12/02/13 2330 12/03/13 0609  NA 136 136* 135*  K 4.5 4.0 3.7  CL  --  97 97  CO2 28 27 25   GLUCOSE 122 108* 92  BUN 16.8 14 12   CREATININE 1.1 1.05 0.92  CALCIUM 9.2 8.4 7.7*   GFR Estimated Creatinine Clearance: 62.1 ml/min (by C-G formula based on Cr of 0.92). Liver Function Tests:  Recent Labs Lab 11/28/13 1320 12/02/13 2330  AST 13 18  ALT 15 13  ALKPHOS 56 52  BILITOT 0.51 0.6  PROT 5.8* 5.8*  ALBUMIN 2.8* 2.9*   CBC:  Recent Labs Lab 11/28/13 1320 12/02/13 2330 12/03/13 0609 12/04/13 0836 12/05/13 0505  WBC 3.7* 1.5* 1.3* 1.7* 1.6*  NEUTROABS 2.8 1.1*  --  1.1* 1.0*  HGB 10.7* 9.4* 8.1* 8.0* 7.7*  HCT 31.2* 27.0* 23.4* 23.0* 21.9*  MCV 95.1 94.1 94.4 93.5 93.6  PLT 128* 80* 70* 73* 78*   Microbiology Recent Results (from the past 240 hour(s))  CULTURE, BLOOD (ROUTINE X 2)     Status: None   Collection Time    12/02/13 11:30 PM      Result Value Range Status   Specimen Description BLOOD LEFT ARM    Final   Special Requests BOTTLES DRAWN AEROBIC AND ANAEROBIC 5CC   Final   Culture  Setup Time     Final   Value: 12/03/2013 04:46     Performed at [facility]   Culture     Final   Value:        BLOOD CULTURE RECEIVED NO GROWTH TO DATE CULTURE WILL BE HELD FOR 5 DAYS BEFORE ISSUING A FINAL NEGATIVE REPORT     Performed at [facility]   Report Status PENDING   Incomplete  CULTURE, BLOOD (ROUTINE X 2)     Status: None   Collection Time    12/02/13 11:45 PM      Result Value Range Status   Specimen Description BLOOD RIGHT ARM   Final   Special Requests BOTTLES DRAWN AEROBIC AND ANAEROBIC 5CC   Final   Culture  Setup Time     Final   Value: 12/03/2013 04:47     Performed at [facility]   Culture     Final   Value:        BLOOD CULTURE RECEIVED NO GROWTH TO DATE CULTURE WILL BE HELD FOR 5 DAYS BEFORE ISSUING A FINAL NEGATIVE REPORT     Performed at [facility]   Report Status PENDING   Incomplete  URINE CULTURE     Status: None   Collection Time    12/03/13  2:18 AM      Result Value Range Status   Specimen Description URINE, RANDOM   Final   Special Requests NONE   Final   Culture  Setup Time     Final   Value: 12/03/2013 15:13     Performed at [facility] Count     Final   Value: 3,000 COLONIES/ML     Performed at [facility]   Culture     Final   Value: INSIGNIFICANT GROWTH     Performed at [facility]   Report Status 12/04/2013 FINAL   Final     Procedures and Diagnostic Studies: Dg Chest 2 View  12/02/2013   CLINICAL DATA:  Cough and congestion  EXAM: CHEST  2 VIEW  COMPARISON:  10/24/2013  FINDINGS: There is is stable mass lesion identified in the left lower lobe superimposed over the spine. Postsurgical changes are again seen. The lungs are hyperinflated. No focal infiltrate or sizable effusion is seen.  IMPRESSION: Stable left lower lobe mass lesion. No new focal abnormality is seen.    Electronically Signed   By: Inez Catalina M.D.   On: 12/02/2013 15:24    Scheduled Meds: . budesonide-formoterol  2 puff Inhalation BID  . enoxaparin (LOVENOX) injection  40 mg Subcutaneous Daily  . imipenem-cilastatin  250 mg Intravenous Q6H  . lactose free nutrition  237 mL Oral BID BM  .  metoprolol succinate  12.5 mg Oral QHS  . metoprolol succinate  25 mg Oral Daily  . pantoprazole  40 mg Oral Daily  . predniSONE  10 mg Oral Q breakfast  . sodium chloride  3 mL Intravenous Q12H  . tamsulosin  0.4 mg Oral Daily   Continuous Infusions: . sodium chloride 75 mL/hr at 12/05/13 0320    Time spent: 25 minutes.   LOS: 3 days   [facility] Pager [phone].   *Please note that the hospitalists switch teams on Wednesdays. Please call the flow manager at [phone] if you are having difficulty reaching the hospitalist taking care of this patient as she can update you and provide the most up-to-date pager number of provider caring for the patient. If 8PM-8AM, please contact night-coverage at [email or web address], password [email or web address]  12/05/2013, 7:27 AM

## 2013-12-06 ENCOUNTER — Ambulatory Visit
Admission: RE | Admit: 2013-12-06 | Discharge: 2013-12-06 | Disposition: A | Discharge status: Home / Self Care | Payer: Medicare Other | Source: Ambulatory Visit | Attending: Radiation Oncology | Admitting: Radiation Oncology

## 2013-12-06 ENCOUNTER — Encounter (HOSPITAL_COMMUNITY): Payer: Self-pay | Admitting: Radiology

## 2013-12-06 ENCOUNTER — Inpatient Hospital Stay (HOSPITAL_COMMUNITY): Payer: Medicare Other

## 2013-12-06 DIAGNOSIS — R071 Chest pain on breathing: Secondary | ICD-10-CM

## 2013-12-06 DIAGNOSIS — R0781 Pleurodynia: Secondary | ICD-10-CM | POA: Diagnosis present

## 2013-12-06 LAB — CBC WITH DIFFERENTIAL/PLATELET
BASOS PCT: 2 % — AB (ref 0–1)
Basophils Absolute: 0 10*3/uL (ref 0.0–0.1)
EOS PCT: 4 % (ref 0–5)
Eosinophils Absolute: 0.1 10*3/uL (ref 0.0–0.7)
HCT: 23.3 % — ABNORMAL LOW (ref 39.0–52.0)
HEMOGLOBIN: 8.2 g/dL — AB (ref 13.0–17.0)
LYMPHS PCT: 22 % (ref 12–46)
Lymphs Abs: 0.4 10*3/uL — ABNORMAL LOW (ref 0.7–4.0)
MCH: 32.9 pg (ref 26.0–34.0)
MCHC: 35.2 g/dL (ref 30.0–36.0)
MCV: 93.6 fL (ref 78.0–100.0)
Monocytes Absolute: 0.5 10*3/uL (ref 0.1–1.0)
Monocytes Relative: 26 % — ABNORMAL HIGH (ref 3–12)
Neutro Abs: 0.8 10*3/uL — ABNORMAL LOW (ref 1.7–7.7)
Neutrophils Relative %: 46 % (ref 43–77)
Platelets: 105 10*3/uL — ABNORMAL LOW (ref 150–400)
RBC: 2.49 MIL/uL — ABNORMAL LOW (ref 4.22–5.81)
RDW: 13.9 % (ref 11.5–15.5)
WBC: 1.8 10*3/uL — ABNORMAL LOW (ref 4.0–10.5)

## 2013-12-06 LAB — CREATININE, SERUM
CREATININE: 0.92 mg/dL (ref 0.50–1.35)
GFR calc non Af Amer: 83 mL/min — ABNORMAL LOW (ref >90)

## 2013-12-06 MED ORDER — IOHEXOL 350 MG/ML SOLN
100.0000 mL | Freq: Once | INTRAVENOUS | Status: AC | PRN
  Administered 2013-12-06: 100 mL via INTRAVENOUS

## 2013-12-06 MED ORDER — TBO-FILGRASTIM 300 MCG/0.5ML ~~LOC~~ SOSY
300.0000 ug | PREFILLED_SYRINGE | Freq: Every day | SUBCUTANEOUS | Status: DC
  Administered 2013-12-06 – 2013-12-07 (×2): 300 ug via SUBCUTANEOUS
  Filled 2013-12-06 (×4): qty 0.5

## 2013-12-06 NOTE — Progress Notes (Signed)
Patient's fall score is 13, requiring a bed alarm on patient per protocol. However, patient and wife requested bed alarm be left off. Patient's wife stated to CN "I can take patient to the bathroom. That's what I'm here for." Patient and his wife are both alert and oriented and were educated on hospital's fall policy. Still insist on having bed alarm off. Will continue to monitor patient.

## 2013-12-06 NOTE — Progress Notes (Signed)
[facility] PROGRESS NOTE   Cameron [name] JIR:[id number] DOB: 1940/12/09 DOA: 12/02/2013 PCP: Walker Kehr, MD  Brief narrative: Cameron [name] is an 73 y.o. male with a PMH of CAD status post CABG, COPD, poorly differentiated non-small cell lung cancer under the care of Dr. Julien Nordmann, receiving concurrent chemoradiation, last chemotherapy 11/22/13, who was admitted 12/03/13 with a chief complaint of fever, chills, and cough. Upon initial evaluation, he had a fever of 102, a WBC of 1.5 and an ANC of 1.1. Chest x-ray was negative for focal infiltrates.  Assessment/Plan: Principal Problem:   Neutropenic fever Patient was admitted and placed on empiric vancomycin and Levaquin. Patient does not have an implanted port, so we changed his antibiotic therapy to empiric Primaxin and discontinued vancomycin and Levaquin 12/03/13. Blood cultures and urine cultures were sent. Influenza panel negative.  Start Neupogen given [place] of 800.  Can D/C when ANC > 1000. Active Problems:   Pleuritic chest pain The patient complains of severe right sided chest pain up under right axilla.  Tumor is on left.  Will get CT angiogram to evaluate pain, rule out PE.   Severe protein calorie malnutrition Seen by dietician, continue Boost.   Cough ContinueTussionex and Mucinex.   Anti-neoplastic chemotherapy induced pancytopenia Monitor counts. No current indication for PRBC or platelet transfusion. Start Neupogen secondary to [place] of 800.   CORONARY ARTERY DISEASE Continue metoprolol. Has been told by one of his treating physicians not to take aspirin, but does not know why.   COPD GOLD II Continue bronchodilators, Symbicort and prednisone 10 mg daily.   HTN (hypertension) Continue metoprolol.   Non-small cell cancer of left lung  Dr. Julien Nordmann saw patient 12/05/13.   DVT Prophylaxis Continue Lovenox.  Code Status: Full. Family Communication: Wife updated at bedside. Disposition Plan: Home when stable.   IV  access:  Peripheral IV  Medical Consultants:  Dr. Julien Nordmann, Oncology.  Other Consultants:  None  Anti-infectives:  Vancomycin 12/03/13---> 12/03/13  Levaquin 12/03/13---> 12/03/13  Primaxin 12/03/13--->  HPI/Subjective: Cameron [name] has ongoing complaints of dry cough, and had pleuritic chest pain again last pm which was not relieved with oral pain medications.  He hurt most of the night, then pain relieved with IV pain medicine this morning.   Appetite is slowly improving.    Objective: Filed Vitals:   12/05/13 2055 12/06/13 0205 12/06/13 0524 12/06/13 0959  BP: 146/75  108/65   Pulse: 88  95   Temp: 97.7 F (36.5 C)  98.2 F (36.8 C)   TempSrc: Oral  Oral   Resp: 18  18   Height:      Weight:      SpO2: 100% 100% 100% 91%    Intake/Output Summary (Last 24 hours) at 12/06/13 1149 Last data filed at 12/06/13 0551  Gross per 24 hour  Intake    640 ml  Output      0 ml  Net    640 ml    Exam: Gen:  NAD Cardiovascular:  Tachycardic, No M/R/G Respiratory:  Lungs diminished Gastrointestinal:  Abdomen soft, NT/ND, + BS Extremities:  Trace edema  Data Reviewed: Basic Metabolic Panel:  Recent Labs Lab 12/02/13 2330 12/03/13 0609 12/06/13 0448  NA 136* 135*  --   K 4.0 3.7  --   CL 97 97  --   CO2 27 25  --   GLUCOSE 108* 92  --   BUN 14 12  --   CREATININE 1.05 0.92 0.92  CALCIUM 8.4 7.7*  --    GFR Estimated Creatinine Clearance: 62.1 ml/min (by C-G formula based on Cr of 0.92). Liver Function Tests:  Recent Labs Lab 12/02/13 2330  AST 18  ALT 13  ALKPHOS 52  BILITOT 0.6  PROT 5.8*  ALBUMIN 2.9*   CBC:  Recent Labs Lab 12/02/13 2330 12/03/13 0609 12/04/13 0836 12/05/13 0505 12/06/13 0448  WBC 1.5* 1.3* 1.7* 1.6* 1.8*  NEUTROABS 1.1*  --  1.1* 1.0* 0.8*  HGB 9.4* 8.1* 8.0* 7.7* 8.2*  HCT 27.0* 23.4* 23.0* 21.9* 23.3*  MCV 94.1 94.4 93.5 93.6 93.6  PLT 80* 70* 73* 78* 105*   Microbiology Recent Results (from the past 240  hour(s))  CULTURE, BLOOD (ROUTINE X 2)     Status: None   Collection Time    12/02/13 11:30 PM      Result Value Range Status   Specimen Description BLOOD LEFT ARM   Final   Special Requests BOTTLES DRAWN AEROBIC AND ANAEROBIC 5CC   Final   Culture  Setup Time     Final   Value: 12/03/2013 04:46     Performed at [facility]   Culture     Final   Value:        BLOOD CULTURE RECEIVED NO GROWTH TO DATE CULTURE WILL BE HELD FOR 5 DAYS BEFORE ISSUING A FINAL NEGATIVE REPORT     Performed at [facility]   Report Status PENDING   Incomplete  CULTURE, BLOOD (ROUTINE X 2)     Status: None   Collection Time    12/02/13 11:45 PM      Result Value Range Status   Specimen Description BLOOD RIGHT ARM   Final   Special Requests BOTTLES DRAWN AEROBIC AND ANAEROBIC 5CC   Final   Culture  Setup Time     Final   Value: 12/03/2013 04:47     Performed at [facility]   Culture     Final   Value:        BLOOD CULTURE RECEIVED NO GROWTH TO DATE CULTURE WILL BE HELD FOR 5 DAYS BEFORE ISSUING A FINAL NEGATIVE REPORT     Performed at [facility]   Report Status PENDING   Incomplete  URINE CULTURE     Status: None   Collection Time    12/03/13  2:18 AM      Result Value Range Status   Specimen Description URINE, RANDOM   Final   Special Requests NONE   Final   Culture  Setup Time     Final   Value: 12/03/2013 15:13     Performed at [facility] Count     Final   Value: 3,000 COLONIES/ML     Performed at [facility]   Culture     Final   Value: INSIGNIFICANT GROWTH     Performed at [facility]   Report Status 12/04/2013 FINAL   Final     Procedures and Diagnostic Studies: Dg Chest 2 View  12/02/2013   CLINICAL DATA:  Cough and congestion  EXAM: CHEST  2 VIEW  COMPARISON:  10/24/2013  FINDINGS: There is is stable mass lesion identified in the left lower lobe superimposed over the spine. Postsurgical changes are again seen. The  lungs are hyperinflated. No focal infiltrate or sizable effusion is seen.  IMPRESSION: Stable left lower lobe mass lesion. No new focal abnormality is seen.   Electronically Signed  By: Inez Catalina M.D.   On: 12/02/2013 15:24    Scheduled Meds: . budesonide-formoterol  2 puff Inhalation BID  . chlorpheniramine-HYDROcodone  5 mL Oral Q12H  . enoxaparin (LOVENOX) injection  40 mg Subcutaneous Daily  . guaiFENesin  600 mg Oral BID  . imipenem-cilastatin  250 mg Intravenous Q6H  . lactose free nutrition  237 mL Oral BID BM  . metoprolol succinate  12.5 mg Oral QHS  . metoprolol succinate  25 mg Oral Daily  . pantoprazole  40 mg Oral Daily  . predniSONE  10 mg Oral Q breakfast  . sodium chloride  3 mL Intravenous Q12H  . tamsulosin  0.4 mg Oral Daily  . Tbo-Filgrastim  300 mcg Subcutaneous q1800   Continuous Infusions:    Time spent: 25 minutes.   LOS: 4 days   [facility] Pager [phone].   *Please note that the hospitalists switch teams on Wednesdays. Please call the flow manager at [phone] if you are having difficulty reaching the hospitalist taking care of this patient as she can update you and provide the most up-to-date pager number of provider caring for the patient. If 8PM-8AM, please contact night-coverage at [email or web address], password [email or web address]  12/06/2013, 11:49 AM

## 2013-12-06 NOTE — Progress Notes (Signed)
[facility] Therapy Treatment Record Phone [phone]   Radiation Therapy was administered to Cameron [name] on: 12/06/2013  4:43 PM and was treatment # 22 out of a planned course of 33 treatments.

## 2013-12-06 NOTE — Progress Notes (Signed)
ANTIBIOTIC CONSULT NOTE - Follow Up  Pharmacy Consult for imipenem Indication: Febrile Neutropenia  Allergies  Allergen Reactions  . Pravastatin Other (See Comments)    CK>2000, muscle weakness  . Rosuvastatin Other (See Comments)    REACTION:muscle aches  . Zolpidem Other (See Comments)    confusion  . Augmentin [Amoxicillin-Pot Clavulanate] Rash    Patient Measurements: Height: 5\' 8"  (172.7 cm) Weight: 131 lb 6.3 oz (59.6 kg) IBW/kg (Calculated) : 68.4  Vital Signs: Temp: 98.2 F (36.8 C) (02/03 0524) Temp src: Oral (02/03 0524) BP: 108/65 mmHg (02/03 0524) Pulse Rate: 95 (02/03 0524) Intake/Output from previous day: 02/02 0701 - 02/03 0700 In: 1096.5 [P.O.:360; I.V.:336.5; IV Piggyback:400] Out: 100 [Urine:100] Intake/Output from this shift:    Labs:  Recent Labs  12/04/13 0836 12/05/13 0505 12/06/13 0448  WBC 1.7* 1.6* 1.8*  HGB 8.0* 7.7* 8.2*  PLT 73* 78* 105*  CREATININE  --   --  0.92   Estimated Creatinine Clearance: 62.1 ml/min (by C-G formula based on Cr of 0.92). No results found for this basename: VANCOTROUGH, VANCOPEAK, VANCORANDOM, GENTTROUGH, GENTPEAK, GENTRANDOM, TOBRATROUGH, TOBRAPEAK, TOBRARND, AMIKACINPEAK, AMIKACINTROU, AMIKACIN,  in the last 72 hours   Medical History: Past Medical History  Diagnosis Date  . COPD (chronic obstructive pulmonary disease)   . Asthma   . Coronary artery disease     a. s/p stenting to the RCA with subsequent CABGx4 (01/2005)->LIMA-LAD,SVG-diagonal, SVG-om, and SVG-PDA;  b. 03/2012 MV: EF 67%, no isch/infarct.  . Hyperlipidemia   . Osteoarthritis   . GERD (gastroesophageal reflux disease)   . Low back pain   . Non-small cell carcinoma of left lung 10/2013 dx    a. chemoradiation  . Hypertension   . Nephrolithiasis     x1  . 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 Lung ca dx'd 09/2013    Assessment: 73 yr male currently undergoing chemotherapy (carbo/paclitaxel) and radiation for  lung cancer.  Reported home temperature of 102F. Admitted with febrile neutropenia.  Last chemo was 1/26.    1/31 >>Levaquin >> 1/31 1/31 >>Vanc >> 1/31 1/31 >> imipenem >>  Tmax: Afebrile WBCs: 1.7 > 1.6 > 1.8 (ANC = 1.1 > 1.0 > 0.8) Renal: Scr = 0.92 with CrCl ~ 62 ml/min  1/31 blood x 2: ngtd 1/31 urine: insignificant growth 1/31 influenza panel: negative  Day #4 Primaxin 250 mg IV q6h for febrile neutropenia.  Fever resolved.  ANC trending down.  MD started Neupogen today until ANC>1000.  Renal function stable.  Goal of Therapy:  Appropriately dose for renal function  Plan:  1.  Continue Imipenem 250mg  IV q6h. 2.  Agree vancomycin not needed if no CRBSI, skin/soft tissue infection, pneumonia, and hemodynamic instability.  Hershal Coria, PharmD, BCPS Pager: [phone] 12/06/2013 1:37 PM

## 2013-12-07 ENCOUNTER — Ambulatory Visit
Admission: RE | Admit: 2013-12-07 | Discharge: 2013-12-07 | Disposition: A | Discharge status: Home / Self Care | Payer: Medicare Other | Source: Ambulatory Visit | Attending: Radiation Oncology | Admitting: Radiation Oncology

## 2013-12-07 LAB — CBC WITH DIFFERENTIAL/PLATELET
BASOS ABS: 0 10*3/uL (ref 0.0–0.1)
Basophils Relative: 0 % (ref 0–1)
EOS PCT: 1 % (ref 0–5)
Eosinophils Absolute: 0 10*3/uL (ref 0.0–0.7)
HCT: 25.2 % — ABNORMAL LOW (ref 39.0–52.0)
Hemoglobin: 8.7 g/dL — ABNORMAL LOW (ref 13.0–17.0)
LYMPHS PCT: 10 % — AB (ref 12–46)
Lymphs Abs: 0.4 10*3/uL — ABNORMAL LOW (ref 0.7–4.0)
MCH: 32.6 pg (ref 26.0–34.0)
MCHC: 34.5 g/dL (ref 30.0–36.0)
MCV: 94.4 fL (ref 78.0–100.0)
Monocytes Absolute: 1 10*3/uL (ref 0.1–1.0)
Monocytes Relative: 28 % — ABNORMAL HIGH (ref 3–12)
NEUTROS PCT: 61 % (ref 43–77)
Neutro Abs: 2.1 10*3/uL (ref 1.7–7.7)
PLATELETS: 146 10*3/uL — AB (ref 150–400)
RBC: 2.67 MIL/uL — AB (ref 4.22–5.81)
RDW: 14.6 % (ref 11.5–15.5)
WBC: 3.5 10*3/uL — AB (ref 4.0–10.5)

## 2013-12-07 MED ORDER — ENSURE PUDDING PO PUDG
1.0000 | Freq: Three times a day (TID) | ORAL | Status: DC
  Administered 2013-12-07 – 2013-12-10 (×4): 1 via ORAL
  Filled 2013-12-07 (×20): qty 1

## 2013-12-07 NOTE — Progress Notes (Signed)
[facility] PROGRESS NOTE   Cameron [name] YKZ:[id number] DOB: 1941/01/04 DOA: 12/02/2013 PCP: Walker Kehr, MD  Brief narrative: 73 y/o ? 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 The immunohistochemistry results are nonspecific under the care of Dr. Julien Nordmann, receiving concurrent chemoradiation, last chemotherapy 11/22/13, who was admitted 12/03/13 with a chief complaint of fever, chills, and cough. Upon initial evaluation, he had a fever of 102, a WBC of 1.5 and an ANC of 1.1. Chest x-ray was negative for focal infiltrates.  Assessment/Plan: Principal Problem:   Neutropenic fever On empiric vancomycin and Levaquin on admission  to empiric Primaxin and discontinued vancomycin and Levaquin 12/03/13.  Blood cultures and urine cultures/31 neg so far  Influenza panel negative.   Started Neupogen given ANC of 810.  Can D/C when ANC > 1000.   Pleuritic chest pain The patient complains of severe right sided chest pain up under right axilla.   CT angiogram =new mets to liver Will discuss c Dr. Earlie Server implications of this   Severe protein calorie malnutrition Seen by dietician, continue Boost.   Cough ContinueTussionex and Mucinex.   Anti-neoplastic chemotherapy induced pancytopenia Await differential plt now trending upwards from [place]ir 70 on 1/31   CORONARY ARTERY DISEASE Continue metoprolol. Has been told by one of his treating physicians not to take aspirin, but does not know why.   COPD GOLD II Continue bronchodilators, Symbicort and prednisone 10 mg daily.   HTN (hypertension) Continue metoprolol.   Non-small  cell cancer of left lung-On Chemo-XRT  Dr. Julien Nordmann saw patient 12/05/13. See above   DVT Prophylaxis Continue Lovenox.  Code Status: Full. Family Communication: non + at bedsdie Disposition Plan: Home when stable.   IV access:  Peripheral IV  Medical Consultants:  Dr. Julien Nordmann, Oncology.  Other Consultants:  None  Anti-infectives:  Vancomycin 12/03/13---> 12/03/13  Levaquin 12/03/13---> 12/03/13  Primaxin 12/03/13--->  HPI/Subjective: Cameron [name] is doing  fair Seems winded after going to RR Able to talk in full sentences. No n/v No CP.  Objective: Filed Vitals:   12/06/13 2044 12/06/13 2118 12/07/13 0435 12/07/13 1350  BP: 104/74  109/65 122/74  Pulse: 94  91 95  Temp: 97.9 F (36.6 C)  98.3 F (36.8 C) 98.5 F (36.9 C)  TempSrc: Oral  Oral Oral  Resp: 18  18 20   Height:      Weight:      SpO2: 98% 97% 97% 97%    Intake/Output Summary (Last 24 hours) at 12/07/13 1440 Last data filed at 12/07/13 0930  Gross per 24 hour  Intake    323 ml  Output    150 ml  Net    173 ml    Exam: Gen:  NAD Cardiovascular:  Tachycardic, No M/R/G Respiratory:  Lungs diminished-wheezy Gastrointestinal:  Abdomen soft, NT/ND, + BS Extremities:  Trace edema  Data Reviewed: Basic Metabolic Panel:  Recent Labs Lab 12/02/13 2330 12/03/13 0609 12/06/13 0448  NA 136* 135*  --   K 4.0 3.7  --   CL 97 97  --   CO2 27 25  --   GLUCOSE 108* 92  --   BUN  14 12  --   CREATININE 1.05 0.92 0.92  CALCIUM 8.4 7.7*  --    GFR Estimated Creatinine Clearance: 62.1 ml/min (by C-G formula based on Cr of 0.92). Liver Function Tests:  Recent Labs Lab 12/02/13 2330  AST 18  ALT 13  ALKPHOS 52  BILITOT 0.6  PROT 5.8*  ALBUMIN 2.9*   CBC:  Recent Labs Lab 12/02/13 2330 12/03/13 0609 12/04/13 0836 12/05/13 0505 12/06/13 0448 12/07/13 1415  WBC 1.5* 1.3* 1.7* 1.6* 1.8* 3.5*  NEUTROABS 1.1*  --  1.1* 1.0* 0.8* PENDING  HGB 9.4* 8.1* 8.0* 7.7* 8.2* 8.7*  HCT  27.0* 23.4* 23.0* 21.9* 23.3* 25.2*  MCV 94.1 94.4 93.5 93.6 93.6 94.4  PLT 80* 70* 73* 78* 105* 146*   Microbiology Recent Results (from the past 240 hour(s))  CULTURE, BLOOD (ROUTINE X 2)     Status: None   Collection Time    12/02/13 11:30 PM      Result Value Range Status   Specimen Description BLOOD LEFT ARM   Final   Special Requests BOTTLES DRAWN AEROBIC AND ANAEROBIC 5CC   Final   Culture  Setup Time     Final   Value: 12/03/2013 04:46     Performed at [facility]   Culture     Final   Value:        BLOOD CULTURE RECEIVED NO GROWTH TO DATE CULTURE WILL BE HELD FOR 5 DAYS BEFORE ISSUING A FINAL NEGATIVE REPORT     Performed at [facility]   Report Status PENDING   Incomplete  CULTURE, BLOOD (ROUTINE X 2)     Status: None   Collection Time    12/02/13 11:45 PM      Result Value Range Status   Specimen Description BLOOD RIGHT ARM   Final   Special Requests BOTTLES DRAWN AEROBIC AND ANAEROBIC 5CC   Final   Culture  Setup Time     Final   Value: 12/03/2013 04:47     Performed at [facility]   Culture     Final   Value:        BLOOD CULTURE RECEIVED NO GROWTH TO DATE CULTURE WILL BE HELD FOR 5 DAYS BEFORE ISSUING A FINAL NEGATIVE REPORT     Performed at [facility]   Report Status PENDING   Incomplete  URINE CULTURE     Status: None   Collection Time    12/03/13  2:18 AM      Result Value Range Status   Specimen Description URINE, RANDOM   Final   Special Requests NONE   Final   Culture  Setup Time     Final   Value: 12/03/2013 15:13     Performed at [facility] Count     Final   Value: 3,000 COLONIES/ML     Performed at [facility]   Culture     Final   Value: INSIGNIFICANT GROWTH     Performed at [facility]   Report Status 12/04/2013 FINAL   Final     Procedures and Diagnostic Studies: Dg Chest 2 View  12/02/2013   CLINICAL DATA:  Cough and congestion  EXAM: CHEST  2 VIEW  COMPARISON:   10/24/2013  FINDINGS: There is is stable mass lesion identified in the left lower lobe superimposed over the spine. Postsurgical changes are again seen. The lungs are hyperinflated. No focal infiltrate or sizable effusion is  seen.  IMPRESSION: Stable left lower lobe mass lesion. No new focal abnormality is seen.   Electronically Signed   By: Inez Catalina M.D.   On: 12/02/2013 15:24    Scheduled Meds: . budesonide-formoterol  2 puff Inhalation BID  . chlorpheniramine-HYDROcodone  5 mL Oral Q12H  . enoxaparin (LOVENOX) injection  40 mg Subcutaneous Daily  . feeding supplement (ENSURE)  1 Container Oral TID WC  . guaiFENesin  600 mg Oral BID  . imipenem-cilastatin  250 mg Intravenous Q6H  . metoprolol succinate  12.5 mg Oral QHS  . metoprolol succinate  25 mg Oral Daily  . pantoprazole  40 mg Oral Daily  . predniSONE  10 mg Oral Q breakfast  . sodium chloride  3 mL Intravenous Q12H  . tamsulosin  0.4 mg Oral Daily  . Tbo-Filgrastim  300 mcg Subcutaneous q1800   Continuous Infusions:    Time spent: 25 minutes.   LOS: 5 days   Nita Sells  [facility] Pager [phone]  *Please note that the hospitalists switch teams on Wednesdays. Please call the flow manager at [phone] if you are having difficulty reaching the hospitalist taking care of this patient as she can update you and provide the most up-to-date pager number of provider caring for the patient. If 8PM-8AM, please contact night-coverage at [email or web address], password [email or web address]  12/07/2013, 2:40 PM

## 2013-12-07 NOTE — Progress Notes (Signed)
PT Cancellation Note  Patient Details Name: Cameron [name] MRN: [id number] DOB: 1941/09/22   Cancelled Treatment:    Reason Eval/Treat Not Completed: Patient declined, no reason specified   Weston Anna, MPT Pager: [phone]

## 2013-12-07 NOTE — Progress Notes (Signed)
[facility] Therapy Treatment Record Phone [phone]   Radiation Therapy was administered to Cameron [name] on: 12/07/2013  6:36 PM and was treatment # 23 out of a planned course of 33 treatments.

## 2013-12-07 NOTE — Progress Notes (Signed)
NUTRITION FOLLOW UP  Intervention:   -Recommend Ensure Pudding po TID, each supplement provides 170 kcal and 4 grams of protein -Discontinue Boost supplement -Encouraged PO intake -Will continue to monitor  Nutrition Dx:   Inadequate oral intake related to chemotherapy as evidenced by 5% intake- improving   Goal: Pt to meet >/= 90% of their estimated nutrition needs    Monitor:   Total protein/energy intake, labs, weights  Assessment:   2/01:Patient with history of CAD s/p CABG, COPD, and non-small cell lung cancer, currently receiving chemotherapy and radiation. He was admitted with neutropenic fever. He reports that his intake has been poor for months, due to cancer treatments since November 2014. However, intake was declining several months prior to diagnosis. Intake is improved today, but still only about 5-10% of meals. He does drink Boost shakes at home. He has lost 14 pounds in the last 3 months (10% UBW).   2/04: Pt with improving appetite. Wife reported pt skipped breakfast and lunch yesterday, but ate 50% of dinner. Has been encouraging pt to eat protein foods (cottage cheese, eggs). Pt also ate well at breakfast today, approximately 50%.  -Pt has been declining Boost as he is tired of the flavor. WIll d/c order. Declined Ensure or [employer].  Was willing to try Ensure pudding as he enjoys sweets and appreciated supplement variety   Height: Ht Readings from Last 1 Encounters:  12/03/13 5\' 8"  (1.727 m)    Weight Status:   Wt Readings from Last 1 Encounters:  12/03/13 131 lb 6.3 oz (59.6 kg)    Estimated needs:  Kcal: 1750-1850 kcal  Protein: 70-85 g  Fluid: >1.8 L/day   Skin: WDL  Diet Order: General   Intake/Output Summary (Last 24 hours) at 12/07/13 1244 Last data filed at 12/07/13 0524  Gross per 24 hour  Intake    263 ml  Output    150 ml  Net    113 ml    Last BM: 2/02   Labs:   Recent Labs Lab 12/02/13 2330  12/03/13 0609 12/06/13 0448  NA 136* 135*  --   K 4.0 3.7  --   CL 97 97  --   CO2 27 25  --   BUN 14 12  --   CREATININE 1.05 0.92 0.92  CALCIUM 8.4 7.7*  --   GLUCOSE 108* 92  --     CBG (last 3)  No results found for this basename: GLUCAP,  in the last 72 hours  Scheduled Meds: . budesonide-formoterol  2 puff Inhalation BID  . chlorpheniramine-HYDROcodone  5 mL Oral Q12H  . enoxaparin (LOVENOX) injection  40 mg Subcutaneous Daily  . guaiFENesin  600 mg Oral BID  . imipenem-cilastatin  250 mg Intravenous Q6H  . lactose free nutrition  237 mL Oral BID BM  . metoprolol succinate  12.5 mg Oral QHS  . metoprolol succinate  25 mg Oral Daily  . pantoprazole  40 mg Oral Daily  . predniSONE  10 mg Oral Q breakfast  . sodium chloride  3 mL Intravenous Q12H  . tamsulosin  0.4 mg Oral Daily  . Tbo-Filgrastim  300 mcg Subcutaneous q1800    Continuous Infusions: None   Atlee Abide MS RD LDN Clinical Dietitian [phone]

## 2013-12-08 ENCOUNTER — Encounter: Payer: Self-pay | Admitting: Radiation Oncology

## 2013-12-08 ENCOUNTER — Ambulatory Visit
Admission: RE | Admit: 2013-12-08 | Discharge: 2013-12-08 | Disposition: A | Discharge status: Home / Self Care | Payer: Medicare Other | Source: Ambulatory Visit | Attending: Radiation Oncology | Admitting: Radiation Oncology

## 2013-12-08 DIAGNOSIS — R5383 Other fatigue: Secondary | ICD-10-CM

## 2013-12-08 DIAGNOSIS — R5381 Other malaise: Secondary | ICD-10-CM

## 2013-12-08 DIAGNOSIS — C3430 Malignant neoplasm of lower lobe, unspecified bronchus or lung: Secondary | ICD-10-CM

## 2013-12-08 DIAGNOSIS — D61818 Other pancytopenia: Secondary | ICD-10-CM

## 2013-12-08 LAB — CBC
HCT: 20.5 % — ABNORMAL LOW (ref 39.0–52.0)
Hemoglobin: 7.1 g/dL — ABNORMAL LOW (ref 13.0–17.0)
MCH: 32.6 pg (ref 26.0–34.0)
MCHC: 34.6 g/dL (ref 30.0–36.0)
MCV: 94 fL (ref 78.0–100.0)
Platelets: 139 10*3/uL — ABNORMAL LOW (ref 150–400)
RBC: 2.18 MIL/uL — AB (ref 4.22–5.81)
RDW: 14.8 % (ref 11.5–15.5)
WBC: 4.4 10*3/uL (ref 4.0–10.5)

## 2013-12-08 NOTE — Progress Notes (Signed)
Repeat ANC from 2/4 2.1 D/c'd Abx/Neupogen  Verneita Griffes, MD [facility] [phone]

## 2013-12-08 NOTE — Progress Notes (Signed)
SATURATION QUALIFICATIONS: (This note is used to comply with regulatory documentation for home oxygen)  Patient Saturations on Room Air at Rest = 98%  Patient Saturations on Room Air while Ambulating = 84%  Patient Saturations on 1 Liters of oxygen while Ambulating = 95%  Please briefly explain why patient needs home oxygen:

## 2013-12-08 NOTE — Progress Notes (Addendum)
[facility] PROGRESS NOTE   Cameron [name] BJY:[id number] DOB: 18-Nov-1940 DOA: 12/02/2013 PCP: Walker Kehr, MD  Brief narrative: 73 y/o ? 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 The immunohistochemistry results are nonspecific under the care of Dr. Julien Nordmann, receiving concurrent chemoradiation, last chemotherapy 11/22/13, who was admitted 12/03/13 with a chief complaint of fever, chills, and cough. Upon initial evaluation, he had a fever of 102, a WBC of 1.5 and an ANC of 1.1. Chest x-ray was negative for focal infiltrates.  Assessment/Plan: Principal Problem:   Neutropenic fever On empiric vancomycin and Levaquin on admission  to empiric Primaxin  Blood cultures and urine cultures/31 neg so far  Influenza panel negative.   Started Neupogen given ANC of 810.   Both IV abx and Neupogen d/c 12/07/13   Pleuritic chest pain The patient complains of severe right sided chest pain up under right axilla.   CT angiogram =new mets to liver Appreciate Dr. Earlie Server discussing with family 0/8/65 implications of this Needs further OP follow up   Severe protein calorie malnutrition Seen by dietician, continue Boost.   Cough ContinueTussionex and Mucinex.   Anti-neoplastic chemotherapy induced pancytopenia CBC + diff am plt now trending upwards from Nadir 70 on 1/31 Hb a little low @ 7.1-if rpt in am low, will transfuse   CORONARY ARTERY DISEASE Continue metoprolol. Has been told by one of his treating physicians not to take aspirin, but does not know why.   COPD GOLD II Continue bronchodilators, Symbicort and prednisone  10 mg daily.   HTN (hypertension) Continue metoprolol.   Non-small cell cancer of left lung-On Chemo-XRT  Dr. Julien Nordmann saw patient 12/08/13. See above   DVT Prophylaxis Continue Lovenox.  Code Status: Full. Family Communication: non + at bedsdie Disposition Plan: Home when stable.   IV access:  Peripheral IV  Medical Consultants:  Dr. Julien Nordmann, Oncology.  Other Consultants:  None  Anti-infectives:  Vancomycin 12/03/13---> 12/03/13  Levaquin 12/03/13---> 12/03/13  Primaxin 12/03/13--->12/07/13  HPI/Subjective: Cameron [name] is doing  fair Still SOB Just back from XRT Son in room PT and family understand implications of testing  Objective: Filed Vitals:   12/08/13 0623 12/08/13 0849 12/08/13 1405 12/08/13 1409  BP: 124/72   126/85  Pulse: 87   110  Temp: 98.1 F (36.7 C)   98 F (36.7 C)  TempSrc: Oral   Oral  Resp: 18   20  Height:      Weight:      SpO2: 97% 98% 84% 95%    Intake/Output Summary (Last 24 hours) at 12/08/13 1724 Last data filed at 12/08/13 1411  Gross per 24 hour  Intake    240 ml  Output    100 ml  Net    140 ml    Exam: Gen:  NAD Cardiovascular:  Tachycardic, No M/R/G Respiratory:  Lungs diminished-wheezy Gastrointestinal:  Abdomen soft, NT/ND, + BS Extremities:  Trace edema  Data Reviewed: Basic Metabolic Panel:  Recent Labs Lab 12/02/13 2330 12/03/13 0609 12/06/13 0448  NA 136* 135*  --   K 4.0 3.7  --   CL 97 97  --   CO2  27 25  --   GLUCOSE 108* 92  --   BUN 14 12  --   CREATININE 1.05 0.92 0.92  CALCIUM 8.4 7.7*  --    GFR Estimated Creatinine Clearance: 62.1 ml/min (by C-G formula based on Cr of 0.92). Liver Function Tests:  Recent Labs Lab 12/02/13 2330  AST 18  ALT 13  ALKPHOS 52  BILITOT 0.6  PROT 5.8*  ALBUMIN 2.9*   CBC:  Recent Labs Lab 12/02/13 2330  12/04/13 0836 12/05/13 0505 12/06/13 0448 12/07/13 1415 12/08/13 0413  WBC 1.5*  < > 1.7* 1.6* 1.8* 3.5* 4.4  NEUTROABS 1.1*  --  1.1*  1.0* 0.8* 2.1  --   HGB 9.4*  < > 8.0* 7.7* 8.2* 8.7* 7.1*  HCT 27.0*  < > 23.0* 21.9* 23.3* 25.2* 20.5*  MCV 94.1  < > 93.5 93.6 93.6 94.4 94.0  PLT 80*  < > 73* 78* 105* 146* 139*  < > = values in this interval not displayed. Microbiology Recent Results (from the past 240 hour(s))  CULTURE, BLOOD (ROUTINE X 2)     Status: None   Collection Time    12/02/13 11:30 PM      Result Value Range Status   Specimen Description BLOOD LEFT ARM   Final   Special Requests BOTTLES DRAWN AEROBIC AND ANAEROBIC 5CC   Final   Culture  Setup Time     Final   Value: 12/03/2013 04:46     Performed at [facility]   Culture     Final   Value:        BLOOD CULTURE RECEIVED NO GROWTH TO DATE CULTURE WILL BE HELD FOR 5 DAYS BEFORE ISSUING A FINAL NEGATIVE REPORT     Performed at [facility]   Report Status PENDING   Incomplete  CULTURE, BLOOD (ROUTINE X 2)     Status: None   Collection Time    12/02/13 11:45 PM      Result Value Range Status   Specimen Description BLOOD RIGHT ARM   Final   Special Requests BOTTLES DRAWN AEROBIC AND ANAEROBIC 5CC   Final   Culture  Setup Time     Final   Value: 12/03/2013 04:47     Performed at [facility]   Culture     Final   Value:        BLOOD CULTURE RECEIVED NO GROWTH TO DATE CULTURE WILL BE HELD FOR 5 DAYS BEFORE ISSUING A FINAL NEGATIVE REPORT     Performed at [facility]   Report Status PENDING   Incomplete  URINE CULTURE     Status: None   Collection Time    12/03/13  2:18 AM      Result Value Range Status   Specimen Description URINE, RANDOM   Final   Special Requests NONE   Final   Culture  Setup Time     Final   Value: 12/03/2013 15:13     Performed at [place]     Final   Value: 3,000 COLONIES/ML     Performed at [facility]   Culture     Final   Value: INSIGNIFICANT GROWTH     Performed at [facility]   Report Status 12/04/2013 FINAL   Final     Procedures and  Diagnostic Studies: Dg Chest 2 View  12/02/2013   CLINICAL DATA:  Cough and congestion  EXAM: CHEST  2 VIEW  COMPARISON:  10/24/2013  FINDINGS: There is is stable mass lesion identified in the left lower lobe superimposed over the spine. Postsurgical changes are again seen. The lungs are hyperinflated. No focal infiltrate or sizable effusion is seen.  IMPRESSION: Stable left lower lobe mass lesion. No new focal abnormality is seen.   Electronically Signed   By: Inez Catalina M.D.   On: 12/02/2013 15:24    Scheduled Meds: . budesonide-formoterol  2 puff Inhalation BID  . chlorpheniramine-HYDROcodone  5 mL Oral Q12H  . enoxaparin (LOVENOX) injection  40 mg Subcutaneous Daily  . feeding supplement (ENSURE)  1 Container Oral TID WC  . guaiFENesin  600 mg Oral BID  . metoprolol succinate  12.5 mg Oral QHS  . metoprolol succinate  25 mg Oral Daily  . pantoprazole  40 mg Oral Daily  . predniSONE  10 mg Oral Q breakfast  . sodium chloride  3 mL Intravenous Q12H  . tamsulosin  0.4 mg Oral Daily   Continuous Infusions:    Time spent: 25 minutes.   LOS: 6 days   Nita Sells  [facility] Pager [phone]  *Please note that the hospitalists switch teams on Wednesdays. Please call the flow manager at [phone] if you are having difficulty reaching the hospitalist taking care of this patient as she can update you and provide the most up-to-date pager number of provider caring for the patient. If 8PM-8AM, please contact night-coverage at [email or web address], password [facility]  12/08/2013, 5:24 PM

## 2013-12-08 NOTE — Progress Notes (Signed)
PT Cancellation Note  Patient Details Name: Cameron [name] MRN: [id number] DOB: 1941-07-05   Cancelled Treatment:    Reason Eval/Treat Not Completed: Fatigue/lethargy limiting ability to participate Pt resting with eyes closed in bed.  Spouse reports he just ambulated with nsg and is tired.  PT asked if okay to check back tomorrow however spouse reports likely d/c home tomorrow.  Spouse had no questions for PT about d/c.  Will check back Monday if pt remains.   Ricca Melgarejo,KATHrine E 12/08/2013, 3:24 PM

## 2013-12-08 NOTE — Progress Notes (Signed)
Subjective: The patient is seen and examined today. His wife was at the bedside. He is feeling a little bit better but continues to have fatigue and weakness. He had repeat CT angiogram of the chest yesterday and unfortunately showed evidence for disease progression. The patient denied having any significant fever or chills. He denied having any chest pain but continues to have shortness of breath. He has no nausea or vomiting.  Objective: Vital signs in last 24 hours: Temp:  [98.1 F (36.7 C)-98.5 F (36.9 C)] 98.1 F (36.7 C) (02/05 0623) Pulse Rate:  [87-106] 87 (02/05 0623) Resp:  [18-20] 18 (02/05 0623) BP: (115-124)/(72-75) 124/72 mmHg (02/05 0623) SpO2:  [92 %-100 %] 97 % (02/05 0623)  Intake/Output from previous day: 02/04 0701 - 02/05 0700 In: 120 [P.O.:120] Out: 100 [Urine:100] Intake/Output this shift:    General appearance: alert, cooperative, fatigued and no distress Resp: wheezes bilaterally Cardio: regular rate and rhythm, S1, S2 normal, no murmur, click, rub or gallop GI: soft, non-tender; bowel sounds normal; no masses,  no organomegaly Extremities: extremities normal, atraumatic, no cyanosis or edema  Lab Results:   Recent Labs  12/07/13 1415 12/08/13 0413  WBC 3.5* 4.4  HGB 8.7* 7.1*  HCT 25.2* 20.5*  PLT 146* 139*   BMET  Recent Labs  12/06/13 0448  CREATININE 0.92    Studies/Results: Ct Angio Chest Pe W/cm &/or Wo Cm  12/06/2013   CLINICAL DATA:  Chest pain ; history lung carcinoma  EXAM: CT ANGIOGRAPHY CHEST WITH CONTRAST  TECHNIQUE: Multidetector CT imaging of the chest was performed using the standard protocol during bolus administration of intravenous contrast. Multiplanar CT image reconstructions including MIPs were obtained to evaluate the vascular anatomy.  CONTRAST:  161m OMNIPAQUE IOHEXOL 350 MG/ML SOLN  COMPARISON:  Chest radiograph December 02, 2013 and chest CT September 19, 2013  FINDINGS: There is no demonstrable pulmonary embolus.  There is no thoracic aortic aneurysm or dissection.  There is underlying emphysema. There is again noted a mass in the medial aspect of the superior segment of the left lower lobe. This mass currently measures 2.4 x 2.2 cm, essentially stable compared to recent prior study. There are small pleural effusions bilaterally. There is lower lobe bronchiectatic change, more on the right than on the left. There is scarring in the right lower lobe with some new interstitial prominence in the right lower lobe, possibly representing mild interstitial edema. Changes of this nature to a lesser extent are also seen in the left lower lobe. A small calcified granuloma is noted in the right lower lobe superior segment, stable.  There is sub- carinal adenopathy currently measuring 5.1 by 3.5, increased in size compared to recent prior study. There is a lymph node between the carina and descending aorta on the left a measuring 1.4 x 1.4 cm. There are several smaller lymph nodes in the mediastinum is well.  Pericardium is not thickened.  In the visualized upper abdomen, there are masses in the visualized liver, largest measuring 2.2 x 2.0 cm in the anterior segment of the right lobe near the dome. The next largest visualize mass is just anterior to the mass does describe measuring 2.2 x 1.8 cm. There are no blastic or lytic bone lesions. Patient is status post coronary artery bypass grafting. Thyroid appears normal.  Review of the MIP images confirms the above findings.  IMPRESSION: No demonstrable pulmonary embolus.  Findings felt to represent a degree of congestive heart failure superimposed on emphysema.  Left  lower lobe mass with progression of adenopathy compared to prior study. Liver metastases also noted.   Electronically Signed   By: Lowella Grip M.D.   On: 12/06/2013 13:57    Medications: I have reviewed the patient's current medications.  Assessment/Plan: This is a very pleasant 73 years old white male with recent  diagnosis of stage IIIA poorly differentiated non-small cell lung cancer, with negative EGFR mutation and negative ALK gene translocation who is currently undergoing concurrent chemoradiation with reduced dose carboplatin and paclitaxel is status post 5 cycles. The patient was tolerating his treatment fairly well except for increasing fatigue and recent pancytopenia.  A recent CT angiogram of the chest showed evidence for disease progression with enlargement of the mediastinal adenopathy as well as new liver metastasis. I have a lengthy discussion with the patient and his wife today about these findings. I do think continuing with concurrent chemoradiation is helpful at this point. I recommended for the patient to consider systemic chemotherapy probably with carboplatin and Alimta after discharge from the hospital. I will arrange for him a followup appointment with me in 1-2 weeks after his discharge for further evaluation and more detailed discussion of his treatment options. The patient and his wife agreed to the current plan.  LOS: 6 days    Kellin Fifer K. 12/08/2013

## 2013-12-08 NOTE — Progress Notes (Signed)
  Radiation Oncology         [phone] ________________________________  Name: [name] MRN: [id number]  Date: 12/02/2013  DOB: 05/13/1941  Chart Note:  I reviewed this patient's most recent findings and wanted to take a minute to document my impression.  He has been receiving radiotherapy to the chest based on an initial Lung Cancer Stage of IIIA warranting definitive radiotherapy with curative intent.  He has received approximately 5 out of 6 1/2 weeks of planned treatment.  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  At this point, Dr. Julien Nordmann and I have discussed the utility of additional radiotherapy which may be minimal.  So, we will discontinue his remaining radiation fractions and offer further radiotherapy later for palliative intent if clinically indicated.  ________________________________  Sheral Apley. Tammi Klippel, M.D.

## 2013-12-08 NOTE — Progress Notes (Signed)
[facility] Therapy Treatment Record Phone [phone]   Radiation Therapy was administered to Cameron [name] on: 12/08/2013  4:34 PM and was treatment # 24 out of a planned course of 33 treatments.

## 2013-12-09 ENCOUNTER — Encounter: Payer: Medicare Other | Admitting: Radiation Oncology

## 2013-12-09 ENCOUNTER — Ambulatory Visit: Payer: Medicare Other

## 2013-12-09 ENCOUNTER — Inpatient Hospital Stay (HOSPITAL_COMMUNITY): Payer: Medicare Other

## 2013-12-09 LAB — CBC WITH DIFFERENTIAL/PLATELET
BASOS PCT: 1 % (ref 0–1)
Basophils Absolute: 0.1 10*3/uL (ref 0.0–0.1)
EOS ABS: 0.1 10*3/uL (ref 0.0–0.7)
Eosinophils Relative: 1 % (ref 0–5)
HEMATOCRIT: 23.6 % — AB (ref 39.0–52.0)
HEMOGLOBIN: 8.1 g/dL — AB (ref 13.0–17.0)
Lymphocytes Relative: 11 % — ABNORMAL LOW (ref 12–46)
Lymphs Abs: 1.1 10*3/uL (ref 0.7–4.0)
MCH: 32.7 pg (ref 26.0–34.0)
MCHC: 34.3 g/dL (ref 30.0–36.0)
MCV: 95.2 fL (ref 78.0–100.0)
MONO ABS: 1.8 10*3/uL — AB (ref 0.1–1.0)
MONOS PCT: 18 % — AB (ref 3–12)
NEUTROS PCT: 69 % (ref 43–77)
NRBC: 4 /100{WBCs} — AB
Neutro Abs: 7 10*3/uL (ref 1.7–7.7)
Platelets: 200 10*3/uL (ref 150–400)
RBC: 2.48 MIL/uL — ABNORMAL LOW (ref 4.22–5.81)
RDW: 15 % (ref 11.5–15.5)
WBC MORPHOLOGY: INCREASED
WBC: 10.1 10*3/uL (ref 4.0–10.5)

## 2013-12-09 LAB — CULTURE, BLOOD (ROUTINE X 2)
CULTURE: NO GROWTH
Culture: NO GROWTH

## 2013-12-09 MED ORDER — FUROSEMIDE 10 MG/ML IJ SOLN
40.0000 mg | Freq: Once | INTRAMUSCULAR | Status: AC
  Administered 2013-12-09: 40 mg via INTRAVENOUS
  Filled 2013-12-09: qty 4

## 2013-12-09 MED ORDER — GUAIFENESIN-DM 100-10 MG/5ML PO SYRP: 5.0000 mL | ORAL_SOLUTION | ORAL | Status: AC | PRN

## 2013-12-09 MED ORDER — ENSURE PUDDING PO PUDG: 1.0000 | Freq: Three times a day (TID) | ORAL | Status: AC

## 2013-12-09 MED ORDER — ALBUTEROL SULFATE (2.5 MG/3ML) 0.083% IN NEBU
2.5000 mg | INHALATION_SOLUTION | Freq: Four times a day (QID) | RESPIRATORY_TRACT | Status: DC
  Administered 2013-12-09 – 2013-12-13 (×16): 2.5 mg via RESPIRATORY_TRACT
  Filled 2013-12-09 (×16): qty 3

## 2013-12-09 MED ORDER — CLONAZEPAM 0.5 MG PO TABS: 0.2500 mg | ORAL_TABLET | Freq: Every evening | ORAL | Status: AC | PRN

## 2013-12-09 NOTE — Progress Notes (Signed)
[facility] PROGRESS NOTE   Cameron [name] [id number]:[id number] DOB: 1940/11/26 DOA: 12/02/2013 PCP: Walker Kehr, MD  Brief narrative: 73 y/o ? 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 The immunohistochemistry results are nonspecific under the care of Dr. Julien Nordmann, receiving concurrent chemoradiation, last chemotherapy 11/22/13, who was admitted 12/03/13 with a chief complaint of fever, chills, and cough. Upon initial evaluation, he had a fever of 102, a WBC of 1.5 and an ANC of 1.1. Chest x-ray was negative for focal  infiltrates. He was found to have new foci of Metastases on his Liver and Rad-Onc and well as Med-Onc staged him newly to stage v cancer with potential palliative intent to therapy, as p[er notes from Dr. Tammi Klippel and discussions with family by Dr. Earlie Server 12/08/13 He began to have progressively worsening SOB without fever or chills on 2/6  Assessment/Plan: Principal Problem:    Dyspnea multifactorial No fever, chills or Rales on exam CT 2/3 "CHf/Emphysema" Likely progression of disease vs Decompensated CHF-trial a dose of IV lasix, get CXR  Monitor overnight and reassess in am  Neutropenic fever  On empiric vancomycin and Levaquin on admission  to empiric Primaxin  Blood cultures and urine cultures/31 neg so far  Influenza panel negative.   Neupogen given ANC of 810.   Both IV abx and Neupogen d/c 12/07/13    Pleuritic chest pain The patient complains of severe right sided chest pain up under right axilla.   CT angiogram =new mets to liver Appreciate Dr. Earlie Server discussing with family 05/05/40  implications of this    Severe protein calorie malnutrition Seen by dietician, continue Boost.    Cough Continue Tussionex and Mucinex.    Anti-neoplastic chemotherapy induced pancytopenia CBC + diff am plt now trending upwards from Nadir 70 on 1/31 Hb stable between 8-9     CORONARY ARTERY DISEASE Continue metoprolol. Has been told by one of his treating physicians not to take aspirin, but does not know why.    COPD GOLD II Continue bronchodilators, Symbicort and prednisone 10 mg daily.    HTN (hypertension) Continue metoprolol.    Non-small cell cancer of left lung  Dr. Julien Nordmann saw patient 12/08/13. See above Not a candidate per 2/5 Rad-Onc note for XRT at present    DVT Prophylaxis Continue Lovenox.  Code Status: Full. Family Communication: non + at bedsdie Disposition Plan: Home when stable.   IV access:  Peripheral IV  Medical Consultants:  Dr. Julien Nordmann, Oncology.  Other Consultants:  None  Anti-infectives:  Vancomycin 12/03/13---> 12/03/13  Levaquin 12/03/13---> 12/03/13  Primaxin 12/03/13--->12/07/13  HPI/Subjective: Cameron [name] is doing poorly Very SOB Cannot ambulate even a couple of steps. Wife very concerned about taking him home in this state Tol diet    Objective: Filed Vitals:   12/08/13 2022 12/08/13 2132 12/09/13 0801 12/09/13 1014  BP:  103/69  151/87  Pulse:  77  100  Temp:  97.8 F (36.6 C)  98.2 F (36.8 C)  TempSrc:  Oral  Oral  Resp:  18  18  Height:      Weight:      SpO2: 95% 97% 98% 96%  Intake/Output Summary (Last 24 hours) at 12/09/13 1227 Last data filed at 12/08/13 1411  Gross per 24 hour  Intake    120 ml  Output      0 ml  Net    120 ml    Exam: Gen:  NAD Cardiovascular:  Tachycardic, No M/R/G Respiratory:  Lungs diminished-wheezy Gastrointestinal:  Abdomen soft, NT/ND, + BS Extremities:  Trace edema  Data Reviewed: Basic Metabolic Panel:  Recent Labs Lab 12/02/13 2330 12/03/13 0609  12/06/13 0448  NA 136* 135*  --   K 4.0 3.7  --   CL 97 97  --   CO2 27 25  --   GLUCOSE 108* 92  --   BUN 14 12  --   CREATININE 1.05 0.92 0.92  CALCIUM 8.4 7.7*  --    GFR Estimated Creatinine Clearance: 62.1 ml/min (by C-G formula based on Cr of 0.92). Liver Function Tests:  Recent Labs Lab 12/02/13 2330  AST 18  ALT 13  ALKPHOS 52  BILITOT 0.6  PROT 5.8*  ALBUMIN 2.9*   CBC:  Recent Labs Lab 12/04/13 0836 12/05/13 0505 12/06/13 0448 12/07/13 1415 12/08/13 0413 12/09/13 0346  WBC 1.7* 1.6* 1.8* 3.5* 4.4 10.1  NEUTROABS 1.1* 1.0* 0.8* 2.1  --  7.0  HGB 8.0* 7.7* 8.2* 8.7* 7.1* 8.1*  HCT 23.0* 21.9* 23.3* 25.2* 20.5* 23.6*  MCV 93.5 93.6 93.6 94.4 94.0 95.2  PLT 73* 78* 105* 146* 139* 200   Microbiology Recent Results (from the past 240 hour(s))  CULTURE, BLOOD (ROUTINE X 2)     Status: None   Collection Time    12/02/13 11:30 PM      Result Value Range Status   Specimen Description BLOOD LEFT ARM   Final   Special Requests BOTTLES DRAWN AEROBIC AND ANAEROBIC 5CC   Final   Culture  Setup Time     Final   Value: 12/03/2013 04:46     Performed at [facility]   Culture     Final   Value: NO GROWTH 5 DAYS     Performed at [facility]   Report Status 12/09/2013 FINAL   Final  CULTURE, BLOOD (ROUTINE X 2)     Status: None   Collection Time    12/02/13 11:45 PM      Result Value Range Status   Specimen Description BLOOD RIGHT ARM   Final   Special Requests BOTTLES DRAWN AEROBIC AND ANAEROBIC 5CC   Final   Culture  Setup Time     Final   Value: 12/03/2013 04:47     Performed at [facility]   Culture     Final   Value: NO GROWTH 5 DAYS     Performed at [facility]   Report Status 12/09/2013 FINAL   Final  URINE CULTURE     Status: None   Collection Time    12/03/13  2:18 AM      Result Value Range Status   Specimen Description URINE, RANDOM   Final   Special Requests NONE   Final   Culture  Setup Time     Final    Value: 12/03/2013 15:13     Performed at [place]     Final   Value: 3,000 COLONIES/ML     Performed at [facility]   Culture     Final   Value: INSIGNIFICANT GROWTH     Performed at  [facility]   Report Status 12/04/2013 FINAL   Final     Procedures and Diagnostic Studies: Dg Chest 2 View  12/02/2013   CLINICAL DATA:  Cough and congestion  EXAM: CHEST  2 VIEW  COMPARISON:  10/24/2013  FINDINGS: There is is stable mass lesion identified in the left lower lobe superimposed over the spine. Postsurgical changes are again seen. The lungs are hyperinflated. No focal infiltrate or sizable effusion is seen.  IMPRESSION: Stable left lower lobe mass lesion. No new focal abnormality is seen.   Electronically Signed   By: Inez Catalina M.D.   On: 12/02/2013 15:24    Scheduled Meds: . albuterol  2.5 mg Nebulization Q6H  . budesonide-formoterol  2 puff Inhalation BID  . chlorpheniramine-HYDROcodone  5 mL Oral Q12H  . enoxaparin (LOVENOX) injection  40 mg Subcutaneous Daily  . feeding supplement (ENSURE)  1 Container Oral TID WC  . guaiFENesin  600 mg Oral BID  . metoprolol succinate  12.5 mg Oral QHS  . metoprolol succinate  25 mg Oral Daily  . pantoprazole  40 mg Oral Daily  . predniSONE  10 mg Oral Q breakfast  . sodium chloride  3 mL Intravenous Q12H  . tamsulosin  0.4 mg Oral Daily   Continuous Infusions:    Time spent: 25 minutes.   LOS: 7 days   Nita Sells  [facility] Pager [phone]  *Please note that the hospitalists switch teams on Wednesdays. Please call the flow manager at [phone] if you are having difficulty reaching the hospitalist taking care of this patient as she can update you and provide the most up-to-date pager number of provider caring for the patient. If 8PM-8AM, please contact night-coverage at [email or web address], password [facility]  12/09/2013, 12:27 PM

## 2013-12-09 NOTE — Progress Notes (Signed)
Spoke with pt and son at bedside concerning home health needs and agencies. Pt states, "my wife want [facility] and [facility] is who I am going with, [facility]".  Referral given to in house rep with [facility].

## 2013-12-10 MED ORDER — PREDNISONE 50 MG PO TABS
60.0000 mg | ORAL_TABLET | Freq: Every day | ORAL | Status: DC
  Administered 2013-12-10 – 2013-12-13 (×4): 60 mg via ORAL
  Filled 2013-12-10 (×5): qty 1

## 2013-12-10 MED ORDER — METOPROLOL SUCCINATE ER 50 MG PO TB24
50.0000 mg | ORAL_TABLET | Freq: Every day | ORAL | Status: DC
  Administered 2013-12-11 – 2013-12-12 (×2): 50 mg via ORAL
  Filled 2013-12-10 (×3): qty 1

## 2013-12-10 MED ORDER — METOPROLOL SUCCINATE ER 25 MG PO TB24
25.0000 mg | ORAL_TABLET | Freq: Every day | ORAL | Status: DC
  Administered 2013-12-10 – 2013-12-12 (×3): 25 mg via ORAL
  Filled 2013-12-10 (×5): qty 1

## 2013-12-10 NOTE — Progress Notes (Signed)
This morning, patient woke up very short of breath and stated he felt hot. His BP was 128/88, respirations 14, temp 97.8, heart rate in the 130's and oxygen 89% on 2L. Bumped up to 3L and RN then called respiratory for a breathing treatment. HR started slowly going down and oxygen was up to 97%. RN checked on patient 10 minutes after breathing treatment, oxygen sat was still at 97% and HR was 107. Will continue to monitor.

## 2013-12-10 NOTE — Progress Notes (Signed)
[facility] PROGRESS NOTE   [name] ZOX:[id number] DOB: 01-Feb-1941 DOA: 12/02/2013 PCP: Walker Kehr, MD  Brief narrative: 73 y/o ? 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 The immunohistochemistry results are nonspecific under the care of Dr. Julien Nordmann, receiving concurrent chemoradiation, last chemotherapy 11/22/13, who was admitted 12/03/13 with a chief complaint of fever, chills, and cough. Upon initial evaluation, he had a fever of 102, a WBC of 1.5 and an ANC of 1.1. Chest x-ray was negative for focal  infiltrates. He was found to have new foci of Metastases on his Liver and Rad-Onc and well as Med-Onc staged him newly to stage v cancer with potential palliative intent to therapy, as p[er notes from Dr. Tammi Klippel and discussions with family by Dr. Earlie Server 12/08/13 He began to have progressively worsening SOB without fever or chills on 2/6  Assessment/Plan: Principal Problem:    Dyspnea multifactorial No fever, chills or Rales on exam CT 2/3 "CHf/Emphysema" Likely progression of disease vs Decompensated CHF-trial a dose of IV lasix-no real effect -CXR 2/6=no changes c/w CHF or COPD -PT to reassess as wife might not be able to care for him -nursing aware to try to ambulate him today 2/7 if he is able and willing  Tachycardia -likely 2/2 to Inc WOB with ambulation -Requested i call Cardiology Dr. Percival Spanish -explained his 1ry Card may not be on -Advised by Dr. Aundra Dubin to increase toprol 50 am, 25 pm  Neutropenic fever  On empiric vancomycin and Levaquin on admission  to empiric Primaxin  Blood cultures and  urine cultures/31 neg so far  Influenza panel negative.   Neupogen given ANC of 810.   Both IV abx and Neupogen d/c 12/07/13    Pleuritic chest pain The patient complained of severe right sided chest pain up under right axilla.   CT angiogram =new mets to liver Appreciate Dr. Earlie Server discussing with family 06/03/18 implications of this    Severe protein calorie malnutrition Seen by dietician, continue Boost.    Cough Continue Tussionex and Mucinex.    Anti-neoplastic chemotherapy induced pancytopenia CBC + diff am plt now trending upwards from Nadir 70 on 1/31 Hb stable between 8-9 Rpt labs am     CORONARY ARTERY DISEASE Continue metoprolol. Has been told by one of his treating physicians not to take aspirin, but does not know why.    COPD GOLD II Continue bronchodilators, Symbicort and prednisone 10 mg daily.    HTN (hypertension) Continue metoprolol-see tachycardia section    Non-small cell cancer of left lung  Dr. Julien Nordmann saw patient 12/08/13. See above Not a candidate per 2/5 Rad-Onc note for XRT at present    DVT Prophylaxis Continue Lovenox.  Code Status: Full. Family Communication: non + at bedsdie Disposition Plan: ?   IV access:  Peripheral IV  Medical Consultants:  Dr. Julien Nordmann, Oncology.  Other Consultants:  None  Anti-infectives:  Vancomycin 12/03/13---> 12/03/13  Levaquin 12/03/13---> 12/03/13  Primaxin 12/03/13--->12/07/13  HPI/Subjective: Cameron [name] is doing poorly worried about Heart rate "is this because of my heart" Requesting tele be placed back on Wants cardiology consult Asks about differences between palliative and hospice care  I explained to him this might be his "new normal"   Objective: Filed Vitals:   12/10/13 0538 12/10/13 0619 12/10/13 0844 12/10/13 1011  BP:    156/91  Pulse: 107 102  104  Temp:    97.5 F (36.4 C)  TempSrc:    Oral  Resp:      Height:      Weight:      SpO2:  95% 95% 94%    Intake/Output  Summary (Last 24 hours) at 12/10/13 1255 Last data filed at 12/10/13 1100  Gross per 24 hour  Intake     90 ml  Output   1600 ml  Net  -1510 ml    Exam: Gen:  NAD Cardiovascular:  Tachycardic, No M/R/G Respiratory:  Lungs diminished-wheezy Gastrointestinal:  Abdomen soft, NT/ND, + BS Extremities:  Trace edema  Data Reviewed: Basic Metabolic Panel:  Recent Labs Lab 12/06/13 0448  CREATININE 0.92   GFR Estimated Creatinine Clearance: 62.1 ml/min (by C-G formula based on Cr of 0.92). Liver Function Tests: No results found for this basename: AST, ALT, ALKPHOS, BILITOT, PROT, ALBUMIN,  in the last 168 hours CBC:  Recent Labs Lab 12/04/13 0836 12/05/13 0505 12/06/13 0448 12/07/13 1415 12/08/13 0413 12/09/13 0346  WBC 1.7* 1.6* 1.8* 3.5* 4.4 10.1  NEUTROABS 1.1* 1.0* 0.8* 2.1  --  7.0  HGB 8.0* 7.7* 8.2* 8.7* 7.1* 8.1*  HCT 23.0* 21.9* 23.3* 25.2* 20.5* 23.6*  MCV 93.5 93.6 93.6 94.4 94.0 95.2  PLT 73* 78* 105* 146* 139* 200   Microbiology Recent Results (from the past 240 hour(s))  CULTURE, BLOOD (ROUTINE X 2)     Status: None   Collection Time    12/02/13 11:30 PM      Result Value Range Status   Specimen Description BLOOD LEFT ARM   Final   Special Requests BOTTLES DRAWN AEROBIC AND ANAEROBIC 5CC   Final   Culture  Setup Time     Final   Value: 12/03/2013 04:46     Performed at [facility]   Culture     Final   Value: NO GROWTH 5 DAYS     Performed at [facility]   Report Status 12/09/2013 FINAL   Final  CULTURE, BLOOD (ROUTINE X 2)     Status: None   Collection Time    12/02/13 11:45 PM      Result Value Range Status   Specimen Description BLOOD RIGHT ARM   Final   Special Requests BOTTLES DRAWN AEROBIC AND ANAEROBIC 5CC   Final   Culture  Setup Time     Final   Value: 12/03/2013 04:47     Performed at [facility]   Culture     Final   Value: NO GROWTH 5 DAYS     Performed at [facility]   Report Status 12/09/2013  FINAL   Final  URINE CULTURE     Status: None   Collection Time    12/03/13  2:18 AM      Result Value Range Status   Specimen Description URINE, RANDOM   Final   Special Requests NONE   Final   Culture  Setup Time     Final   Value: 12/03/2013 15:13     Performed at [facility]     Final   Value: 3,000 COLONIES/ML     Performed at [facility]  Final   Value: INSIGNIFICANT GROWTH     Performed at [facility]   Report Status 12/04/2013 FINAL   Final     Procedures and Diagnostic Studies: Dg Chest 2 View  12/02/2013   CLINICAL DATA:  Cough and congestion  EXAM: CHEST  2 VIEW  COMPARISON:  10/24/2013  FINDINGS: There is is stable mass lesion identified in the left lower lobe superimposed over the spine. Postsurgical changes are again seen. The lungs are hyperinflated. No focal infiltrate or sizable effusion is seen.  IMPRESSION: Stable left lower lobe mass lesion. No new focal abnormality is seen.   Electronically Signed   By: Inez Catalina M.D.   On: 12/02/2013 15:24    Scheduled Meds: . albuterol  2.5 mg Nebulization Q6H  . budesonide-formoterol  2 puff Inhalation BID  . chlorpheniramine-HYDROcodone  5 mL Oral Q12H  . enoxaparin (LOVENOX) injection  40 mg Subcutaneous Daily  . feeding supplement (ENSURE)  1 Container Oral TID WC  . guaiFENesin  600 mg Oral BID  . metoprolol succinate  25 mg Oral QHS  . [START ON 12/11/2013] metoprolol succinate  50 mg Oral Daily  . pantoprazole  40 mg Oral Daily  . predniSONE  60 mg Oral Q breakfast  . sodium chloride  3 mL Intravenous Q12H  . tamsulosin  0.4 mg Oral Daily   Continuous Infusions:    Time spent: 25 minutes.   LOS: 8 days   Nita Sells  [facility] Pager [phone]  *Please note that the hospitalists switch teams on Wednesdays. Please call the flow manager at [phone] if you are having difficulty reaching the hospitalist taking care of this patient as she  can update you and provide the most up-to-date pager number of provider caring for the patient. If 8PM-8AM, please contact night-coverage at [email or web address], password [facility]  12/10/2013, 12:55 PM

## 2013-12-11 LAB — COMPREHENSIVE METABOLIC PANEL
ALT: 12 U/L (ref 0–53)
AST: 17 U/L (ref 0–37)
Albumin: 2.4 g/dL — ABNORMAL LOW (ref 3.5–5.2)
Alkaline Phosphatase: 65 U/L (ref 39–117)
BUN: 14 mg/dL (ref 6–23)
CALCIUM: 8.1 mg/dL — AB (ref 8.4–10.5)
CO2: 33 meq/L — AB (ref 19–32)
Chloride: 94 mEq/L — ABNORMAL LOW (ref 96–112)
Creatinine, Ser: 0.89 mg/dL (ref 0.50–1.35)
GFR calc Af Amer: >90 mL/min (ref >90)
GFR, EST NON AFRICAN AMERICAN: 83 mL/min — AB (ref >90)
GLUCOSE: 111 mg/dL — AB (ref 70–99)
Potassium: 3.1 mEq/L — ABNORMAL LOW (ref 3.7–5.3)
SODIUM: 137 meq/L (ref 137–147)
Total Bilirubin: 0.4 mg/dL (ref 0.3–1.2)
Total Protein: 5.2 g/dL — ABNORMAL LOW (ref 6.0–8.3)

## 2013-12-11 LAB — CBC
HEMATOCRIT: 24.2 % — AB (ref 39.0–52.0)
HEMOGLOBIN: 8.2 g/dL — AB (ref 13.0–17.0)
MCH: 32.7 pg (ref 26.0–34.0)
MCHC: 33.9 g/dL (ref 30.0–36.0)
MCV: 96.4 fL (ref 78.0–100.0)
Platelets: 200 10*3/uL (ref 150–400)
RBC: 2.51 MIL/uL — AB (ref 4.22–5.81)
RDW: 15.5 % (ref 11.5–15.5)
WBC: 7.1 10*3/uL (ref 4.0–10.5)

## 2013-12-11 NOTE — Progress Notes (Signed)
°  Radiation Oncology         [phone] ________________________________  Name: [name] MRN: [id number]  Date: 12/08/2013  DOB: January 18, 1941  End of Treatment Note  Diagnosis: 73 year old gentleman with stage T1b N2 M0 non-small cell carcinoma of the left lung - Stage IIIA  Indication for treatment:  Initially Curative with Interim development of metastatic disease       Radiation treatment dates:   11/07/2013-12/08/2013  Site/dose:   The patient's tumor and lymph nodes received 48 Gy in 24 fractions of 2 Gy  Beams/energy:   Image-guided radiotherapy was performed with TomoTherapy 3D planning to deliver helical 3D 6 MV X-rays.  Narrative: The patient tolerated radiation treatment relatively poorly.  He had worsening dyspnea during radiotherapy, and was admitted with failure to thrive.  He was discovered to have progressive disease with hematogenous metastases.  Plan: The patient has completed radiation treatment. He will follow-up prn. ________________________________  Sheral Apley. Tammi Klippel, M.D.

## 2013-12-11 NOTE — Progress Notes (Signed)
[facility] PROGRESS NOTE   Cameron [name] MPN:[id number] DOB: 08-05-1941 DOA: 12/02/2013 PCP: Walker Kehr, MD  Brief narrative: 73 y/o ? 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 The immunohistochemistry results are nonspecific under the care of Dr. Julien Nordmann, receiving concurrent chemoradiation, last chemotherapy 11/22/13, who was admitted 12/03/13 with a chief complaint of fever, chills, and cough. Upon initial evaluation, he had a fever of 102, a WBC of 1.5 and an ANC of 1.1. Chest x-ray was negative for focal  infiltrates. He was found to have new foci of Metastases on his Liver and Rad-Onc and well as Med-Onc staged him newly to stage v cancer with potential palliative intent to therapy, as p[er notes from Dr. Tammi Klippel and discussions with family by Dr. Earlie Server 12/08/13 He began to have progressively worsening SOB without fever or chills on 2/6  Assessment/Plan:     Dyspnea multifactorial No fever, chills or Rales on exam CT 2/3 "CHf/Emphysema" Likely progression of disease vs Decompensated CHF-trial a dose of IV lasix-no real effect -CXR 2/6=no changes c/w CHF or COPD -PT to reassess as wife might not be able to care for him -nursing aware to try to ambulate him q shift  Tachycardia  -likely 2/2 to Inc WOB with ambulation  -Requested i call Cardiology Dr. Percival Spanish  -explained his 1ry Card may not be on  -Advised by Dr. Aundra Dubin to increase toprol 50 am, 25 pm  -improved  Neutropenic fever   On empiric vancomycin and Levaquin on admission  to empiric Primaxin   Blood cultures and urine cultures/31  neg so far   Influenza panel negative.    Neupogen given ANC of 810.    Both IV abx and Neupogen d/c 12/07/13    Pleuritic chest pain  The patient complained of severe right sided chest pain up under right axilla.    CT angiogram =new mets to liver  Appreciate Dr. Earlie Server discussing with family 0/0/86 implications of this    Severe protein calorie malnutrition Seen by dietician, continue Boost.    Cough Continue Tussionex and Mucinex.    Anti-neoplastic chemotherapy induced pancytopenia  plt now trending upwards from Nadir 70 on 1/31  Hb stable between 8-9  Stop checking CBC for now as could have iatrogenic anemia    CORONARY ARTERY DISEASE Continue metoprolol. Has been told by one of his treating physicians not to take aspirin, but does not know why.    COPD GOLD II Continue bronchodilators, Symbicort and prednisone 10 mg daily.    HTN (hypertension) Continue metoprolol-see tachycardia section    Non-small cell cancer of left lung   Dr. Julien Nordmann saw patient 12/08/13.  See above  Not a candidate per 2/5 Rad-Onc note for XRT at present  Will forward note to Dr. Earlie Server to comment-famiyl requesting to see him    DVT Prophylaxis Continue Lovenox.  Code Status: Full. Family Communication: non + at bedsdie Disposition Plan: ?Pt/OT to help c dispo-might need SNF   IV access:  Peripheral IV  Medical Consultants:  Dr. Julien Nordmann, Oncology.  Other Consultants:  None  Anti-infectives:  Vancomycin 12/03/13---> 12/03/13  Levaquin 12/03/13---> 12/03/13  Primaxin 12/03/13--->12/07/13  HPI/Subjective: Cameron [name] is doing a little better this am Thinks steroids have helped Ate a little Tolerating ambulation poorly  Objective: Filed Vitals:   12/11/13 0539 12/11/13 0924 12/11/13 1025 12/11/13 1356  BP: 131/76  129/69 107/59  Pulse: 104  89 85  Temp: 97.6 F (36.4 C)  97.4 F (36.3 C) 97.7 F (36.5 C)  TempSrc: Oral  Oral Oral  Resp: 16  18 18     Height:      Weight:      SpO2: 98% 96% 100% 99%    Intake/Output Summary (Last 24 hours) at 12/11/13 1532 Last data filed at 12/11/13 2836  Gross per 24 hour  Intake    120 ml  Output    400 ml  Net   -280 ml    Exam: Gen:  NAD Cardiovascular:  Tachycardic, No M/R/G Respiratory:  Lungs diminished-wheezing decreased Gastrointestinal:  Abdomen soft, NT/ND, + BS Extremities:  Trace edema  Data Reviewed: Basic Metabolic Panel:  Recent Labs Lab 12/06/13 0448 12/11/13 0421  NA  --  137  K  --  3.1*  CL  --  94*  CO2  --  33*  GLUCOSE  --  111*  BUN  --  14  CREATININE 0.92 0.89  CALCIUM  --  8.1*   GFR Estimated Creatinine Clearance: 63.2 ml/min (by C-G formula based on Cr of 0.89). Liver Function Tests:  Recent Labs Lab 12/11/13 0421  AST 17  ALT 12  ALKPHOS 65  BILITOT 0.4  PROT 5.2*  ALBUMIN 2.4*   CBC:  Recent Labs Lab 12/05/13 0505 12/06/13 0448 12/07/13 1415 12/08/13 0413 12/09/13 0346 12/11/13 0421  WBC 1.6* 1.8* 3.5* 4.4 10.1 7.1  NEUTROABS 1.0* 0.8* 2.1  --  7.0  --   HGB 7.7* 8.2* 8.7* 7.1* 8.1* 8.2*  HCT 21.9* 23.3* 25.2* 20.5* 23.6* 24.2*  MCV 93.6 93.6 94.4 94.0 95.2 96.4  PLT 78* 105* 146* 139* 200 200   Microbiology Recent Results (from the past 240 hour(s))  CULTURE, BLOOD (ROUTINE X 2)     Status: None   Collection Time    12/02/13 11:30 PM      Result Value Range Status   Specimen Description BLOOD LEFT ARM   Final   Special Requests BOTTLES DRAWN AEROBIC AND ANAEROBIC 5CC   Final   Culture  Setup Time     Final   Value: 12/03/2013 04:46     Performed at [facility]   Culture     Final   Value: NO GROWTH 5 DAYS     Performed at [facility]   Report Status 12/09/2013 FINAL   Final  CULTURE, BLOOD (ROUTINE X 2)     Status: None   Collection Time    12/02/13 11:45 PM      Result Value Range Status   Specimen Description BLOOD RIGHT ARM   Final   Special Requests BOTTLES DRAWN AEROBIC AND ANAEROBIC 5CC    Final   Culture  Setup Time     Final   Value: 12/03/2013 04:47     Performed at [facility]   Culture     Final   Value: NO GROWTH 5 DAYS     Performed at [facility]   Report Status 12/09/2013 FINAL   Final  URINE CULTURE     Status: None   Collection Time    12/03/13  2:18 AM      Result Value  Range Status   Specimen Description URINE, RANDOM   Final   Special Requests NONE   Final   Culture  Setup Time     Final   Value: 12/03/2013 15:13     Performed at [place]     Final   Value: 3,000 COLONIES/ML     Performed at [facility]   Culture     Final   Value: INSIGNIFICANT GROWTH     Performed at [facility]   Report Status 12/04/2013 FINAL   Final     Procedures and Diagnostic Studies: Dg Chest 2 View  12/02/2013   CLINICAL DATA:  Cough and congestion  EXAM: CHEST  2 VIEW  COMPARISON:  10/24/2013  FINDINGS: There is is stable mass lesion identified in the left lower lobe superimposed over the spine. Postsurgical changes are again seen. The lungs are hyperinflated. No focal infiltrate or sizable effusion is seen.  IMPRESSION: Stable left lower lobe mass lesion. No new focal abnormality is seen.   Electronically Signed   By: Inez Catalina M.D.   On: 12/02/2013 15:24    Scheduled Meds: . albuterol  2.5 mg Nebulization Q6H  . budesonide-formoterol  2 puff Inhalation BID  . chlorpheniramine-HYDROcodone  5 mL Oral Q12H  . enoxaparin (LOVENOX) injection  40 mg Subcutaneous Daily  . feeding supplement (ENSURE)  1 Container Oral TID WC  . guaiFENesin  600 mg Oral BID  . metoprolol succinate  25 mg Oral QHS  . metoprolol succinate  50 mg Oral Daily  . pantoprazole  40 mg Oral Daily  . predniSONE  60 mg Oral Q breakfast  . sodium chloride  3 mL Intravenous Q12H  . tamsulosin  0.4 mg Oral Daily   Continuous Infusions:    Time spent: 25 minutes.   LOS: 9 days   Nita Sells  [facility] Pager  [phone]  *Please note that the hospitalists switch teams on Wednesdays. Please call the flow manager at [phone] if you are having difficulty reaching the hospitalist taking care of this patient as she can update you and provide the most up-to-date pager number of provider caring for the patient. If 8PM-8AM, please contact night-coverage at [email or web address], password [facility]  12/11/2013, 3:32 PM

## 2013-12-11 NOTE — Progress Notes (Signed)
Pt walked from his room to room 1437 before becoming SOB. pt O2 levels dropped to 88%, pulse 102 on 3L. Sat pt down in wheelchair, pt O2 levels improved to 97%. Pt is now back in bed, will continue to monitor.

## 2013-12-12 ENCOUNTER — Other Ambulatory Visit: Payer: Medicare Other

## 2013-12-12 ENCOUNTER — Ambulatory Visit: Payer: Medicare Other

## 2013-12-12 ENCOUNTER — Other Ambulatory Visit: Payer: Self-pay | Admitting: Internal Medicine

## 2013-12-12 ENCOUNTER — Ambulatory Visit: Payer: Medicare Other | Admitting: Internal Medicine

## 2013-12-12 DIAGNOSIS — C3492 Malignant neoplasm of unspecified part of left bronchus or lung: Secondary | ICD-10-CM

## 2013-12-12 DIAGNOSIS — C787 Secondary malignant neoplasm of liver and intrahepatic bile duct: Secondary | ICD-10-CM

## 2013-12-12 MED ORDER — ALBUTEROL SULFATE (2.5 MG/3ML) 0.083% IN NEBU: 2.5000 mg | INHALATION_SOLUTION | Freq: Four times a day (QID) | RESPIRATORY_TRACT | Status: AC

## 2013-12-12 MED ORDER — PREDNISONE 20 MG PO TABS: 60.0000 mg | ORAL_TABLET | Freq: Every day | ORAL | Status: AC

## 2013-12-12 MED ORDER — METOPROLOL SUCCINATE ER 25 MG PO TB24: 25.0000 mg | ORAL_TABLET | Freq: Every day | ORAL | Status: DC

## 2013-12-12 MED ORDER — METOPROLOL SUCCINATE ER 50 MG PO TB24: 50.0000 mg | ORAL_TABLET | Freq: Every day | ORAL | Status: DC

## 2013-12-12 NOTE — Discharge Summary (Signed)
Physician Discharge Summary  Cameron [name] IHK:[id number] DOB: [birth year] DOA: 12/02/2013  PCP: Walker Kehr, MD  Admit date: 12/02/2013 Discharge date: 12/12/2013  Time spent: 40 minutes  Recommendations for Outpatient Follow-up:  1. Get complete blood count and complete metabolic panel in about 3-4 days 2. Further outpatient care as well as goals of care to be discussed with both primary care physician and oncologist-patient is considering going to [name] for further cancer care and it has been explained to him that his cancer may not be treatable any other way but palliative per my discussion with Dr. Earlie Server on 12/12/13 3. Patient will require home oxygen at all times 4. Continue steroids at current dose 60 mg long-term but very slow taper as an outpatient 5. Liberalize diet 6. Suspect poor overall outpatient prognosis  Discharge Diagnoses:  Principal Problem:   Neutropenic fever Active Problems:   HYPERLIPIDEMIA   CORONARY ARTERY DISEASE   COPD GOLD II   Cough   HTN (hypertension)   Non-small cell cancer of left lung   Antineoplastic chemotherapy induced pancytopenia   Protein-calorie malnutrition, severe   Pleuritic chest pain   Physical therapy notes Bed Mobility  Overal bed mobility: Modified Independent  General bed mobility comments: increased time  Transfers  Overall transfer level: Modified independent  Equipment used: None  Transfers: Sit to/from Stand  Sit to Stand: Modified independent (Device/Increase time)  General transfer comment: good safety cognition and use of hands to steady self when needed  Ambulation/Gait  Ambulation Distance (Feet): 360 Feet (120' x 3 sitting rest breaks)  Assistive device: None  Gait velocity: slow  General Gait Details: at times , some veering to side,esp with increased fatigue pt corrects self and requires freq sitting rest breaks    Discharge Condition: Guarded  Diet recommendation: Liberalize  Filed Weights   12/03/13 0440 12/12/13 0500  Weight: 59.6 kg (131 lb 6.3 oz) 61.326 kg (135 lb 3.2 oz)    History of present illness:  73 y/o ? 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 The immunohistochemistry results are nonspecific under the care of Dr. Julien Nordmann, receiving concurrent chemoradiation, last chemotherapy 11/22/13, who was admitted 12/03/13 with a chief complaint of fever, chills, and cough. Upon initial evaluation, he had a fever of 102, a WBC of 1.5 and an ANC of 1.1. Chest x-ray was negative for focal  infiltrates.  He was found to have new foci of Metastases on his Liver and Rad-Onc and well as Med-Onc staged him newly to stage v cancer with potential palliative intent to therapy, as per notes from Dr. Tammi Klippel and discussions with family by Dr. Earlie Server 12/08/13  He began to have progressively worsening SOB without fever or chills on 2/6 It was thought that his COPD or cancer at rest and he did slightly better on steroids as below   Hospital Course:    Dyspnea multifactorial  No fever, chills or Rales on exam  CT 2/3 "CHf/Emphysema"   Likely progression of disease vs Decompensated CHF-trial a dose of IV lasix-no real effect  -CXR 2/6=no changes c/w CHF or COPD  -Ultimately prednisone 60 mg seemed to help the most  with his shortness of breath although he is severely deconditioned, family and wife wish for patient to go home. This is a safe discharge plan as multiple family members can be made available to help out Tachycardia  -likely 2/2 to [facility] WOB with ambulation  -Requested i call Cardiology Dr. Percival Spanish during this  admission -Advised by Dr. Aundra Dubin to increase toprol 50 am, 25 pm  -improved -Etiology likely secondary to dyspnea and primary disease process of cancer--would not workup any further Neutropenic fever  On empiric vancomycin and Levaquin on admission to empiric Primaxin  Blood cultures and urine cultures neg Influenza panel negative.  Neupogen was given for ANC 810.  Both IV abx and Neupogen d/c 12/07/13 Pleuritic chest pain  The patient complained of severe right sided chest pain up under right axilla.  CT angiogram =new mets to liver  Appreciate Dr. Earlie Server discussing with family 04/06/32 implications of this Severe protein calorie malnutrition  Seen by dietician, continue Boost.  Cough  Continue Tussionex and Mucinex.  Anti-neoplastic chemotherapy induced pancytopenia  plt now trending upwards from Nadir 70 on 1/31  Hb stable between 8-9  Stop checking CBC for now as could have iatrogenic anemia CORONARY ARTERY DISEASE  Continue metoprolol. Has been told by one of his treating physicians not to take aspirin, but does not know why.  COPD GOLD II HE  Continue bronchodilators, Symbicort and prednisone 10 mg daily.  HTN (hypertension)  Continue metoprolol-see tachycardia section  Non-small cell cancer of left lung  Dr. Julien Nordmann saw patient 12/08/13.  See above  Not a candidate per 2/5 Rad-Onc note for XRT at present   discussed with Dr. Earlie Server  And needs close follow up-unlikely any other options other than palliative     Discharge Exam: Filed Vitals:   12/12/13 0815  BP: 158/72  Pulse: 72  Temp: 97.9 F (36.6 C)  Resp: 18    shortness of breath improved No chest pain Celebrating her birthday yesterday Tolerating diet Wife at bedside   General:  EOMI, NCAT Cardiovascular:  S1-S2 no murmur rub or gallop Respiratory:  no crackles  Discharge Instructions  Discharge Orders   Future Appointments Provider Department Dept Phone   12/12/2013 1:15 PM [facility] [phone]   12/12/2013 1:45 PM Curt Bears, MD [facility] [phone]   12/12/2013 3:00 PM [facility] [phone]   12/12/2013 4:40 PM [facility] [phone]   12/13/2013 4:40 PM [facility] [phone]   12/14/2013 4:40 PM [facility] [phone]   12/15/2013 4:40 PM [facility] [phone]   12/16/2013 4:40 PM [facility] [phone]   12/19/2013 4:40 PM [facility] [phone]   12/20/2013 4:40 PM [facility] [phone]   12/21/2013 4:40 PM [facility] [phone]   01/19/2014 9:45 AM Minus Breeding, MD [facility] [phone]   Future Orders Complete By Expires   Diet - low sodium heart healthy  As directed    Discharge instructions  As directed    Comments:     Continue steroids at current dose until u see Dr. Ricard Dillon will  need close monitoring with Therapy I have rx you a nebuliser machine Please follow up soon with Dr. Earlie Server for  Instructions about your cancer care   Increase activity slowly  As directed        Medication List         albuterol 108 (90 BASE) MCG/ACT inhaler  Commonly known as:  PROVENTIL HFA;VENTOLIN HFA  Inhale 2 puffs into the lungs every 6 (six) hours as needed for wheezing or shortness of breath.     albuterol (2.5 MG/3ML) 0.083% nebulizer solution  Commonly known as:  PROVENTIL  Take 3 mLs (2.5 mg total) by nebulization every 6 (six) hours.     budesonide-formoterol 160-4.5 MCG/ACT inhaler   Commonly known as:  SYMBICORT  Inhale 2 puffs into the lungs 2 (two) times daily.     cholecalciferol 1000 UNITS tablet  Commonly known as:  VITAMIN D  Take 1,000 Units by mouth every morning.     clonazePAM 0.5 MG tablet  Commonly known as:  KLONOPIN  Take 0.5 tablets (0.25 mg total) by mouth at bedtime as needed (sleep). As needed for sleep     diphenhydrAMINE 25 MG tablet  Commonly known as:  BENADRYL  Take 25 mg by mouth every 6 (six) hours as needed for allergies.     feeding supplement (ENSURE) Pudg  Take 1 Container by mouth 3 (three) times daily with meals.     guaiFENesin-dextromethorphan 100-10 MG/5ML syrup  Commonly known as:  ROBITUSSIN DM  Take 5 mLs by mouth every 4 (four) hours as needed for cough.     hyaluronate sodium Gel  Apply 1 application topically 2 (two) times daily as needed (for skin irritation due to radiation treatment).     metoprolol succinate 50 MG 24 hr tablet  Commonly known as:  TOPROL-XL  Take 1 tablet (50 mg total) by mouth daily. Take with or immediately following a meal.     metoprolol succinate 25 MG 24 hr tablet  Commonly known as:  TOPROL-XL  Take 1 tablet (25 mg total) by mouth at bedtime.     nitroGLYCERIN 0.4 MG SL tablet  Commonly known as:  NITROSTAT  Place 0.4 mg under the tongue every 5 (five) minutes as needed for chest pain. x3 doses as needed for chest pain     omeprazole 20 MG capsule  Commonly known as:  PRILOSEC  Take 20 mg by mouth every morning.     predniSONE 20 MG tablet  Commonly known as:  DELTASONE  Take 3 tablets (60 mg total) by mouth daily with breakfast.     prochlorperazine 10 MG tablet  Commonly known as:  COMPAZINE  Take 1 tablet (10 mg total) by mouth every 6 (six) hours as needed for nausea or vomiting.     promethazine-codeine 6.25-10 MG/5ML syrup  Commonly known as:  PHENERGAN with CODEINE  Take 5 mLs by mouth every 4 (four) hours as needed for cough.     sucralfate 1 G tablet  Commonly  known as:  CARAFATE  Take 1 tablet (1 g total) by mouth 4 (four) times daily -  with meals and at bedtime. Grind into a glass of water, 5 minutes before meals     tamsulosin 0.4 MG Caps capsule  Commonly known as:  FLOMAX  Take 0.4 mg by mouth daily.     traMADol 50 MG tablet  Commonly known as:  ULTRAM  Take 50 mg by mouth every 6 (six) hours as needed  for moderate pain.     triamcinolone cream 0.5 %  Commonly known as:  KENALOG  Apply 1 application topically 3 (three) times daily as needed (rash).       Allergies  Allergen Reactions  . Pravastatin Other (See Comments)    CK>2000, muscle weakness  . Rosuvastatin Other (See Comments)    REACTION:muscle aches  . Zolpidem Other (See Comments)    confusion  . Augmentin [Amoxicillin-Pot Clavulanate] Rash       Follow-up Information   Follow up with [facility] K., MD. Schedule an appointment as soon as possible for a visit in 2 days.   Specialty:  Oncology   Contact information:   [address] [place] [address] [phone]        The results of significant diagnostics from this hospitalization (including imaging, microbiology, ancillary and laboratory) are listed below for reference.    Significant Diagnostic Studies: Dg Chest 2 View  12/02/2013   CLINICAL DATA:  Cough and congestion  EXAM: CHEST  2 VIEW  COMPARISON:  10/24/2013  FINDINGS: There is is stable mass lesion identified in the left lower lobe superimposed over the spine. Postsurgical changes are again seen. The lungs are hyperinflated. No focal infiltrate or sizable effusion is seen.  IMPRESSION: Stable left lower lobe mass lesion. No new focal abnormality is seen.   Electronically Signed   By: Inez Catalina M.D.   On: 12/02/2013 15:24   Ct Angio Chest Pe W/cm &/or Wo Cm  12/06/2013   CLINICAL DATA:  Chest pain ; history lung carcinoma  EXAM: CT ANGIOGRAPHY CHEST WITH CONTRAST  TECHNIQUE: Multidetector CT imaging of the chest was performed using the  standard protocol during bolus administration of intravenous contrast. Multiplanar CT image reconstructions including MIPs were obtained to evaluate the vascular anatomy.  CONTRAST:  117mL OMNIPAQUE IOHEXOL 350 MG/ML SOLN  COMPARISON:  Chest radiograph December 02, 2013 and chest CT September 19, 2013  FINDINGS: There is no demonstrable pulmonary embolus. There is no thoracic aortic aneurysm or dissection.  There is underlying emphysema. There is again noted a mass in the medial aspect of the superior segment of the left lower lobe. This mass currently measures 2.4 x 2.2 cm, essentially stable compared to recent prior study. There are small pleural effusions bilaterally. There is lower lobe bronchiectatic change, more on the right than on the left. There is scarring in the right lower lobe with some new interstitial prominence in the right lower lobe, possibly representing mild interstitial edema. Changes of this nature to a lesser extent are also seen in the left lower lobe. A small calcified granuloma is noted in the right lower lobe superior segment, stable.  There is sub- carinal adenopathy currently measuring 5.1 by 3.5, increased in size compared to recent prior study. There is a lymph node between the carina and descending aorta on the left a measuring 1.4 x 1.4 cm. There are several smaller lymph nodes in the mediastinum is well.  Pericardium is not thickened.  In the visualized upper abdomen, there are masses in the visualized liver, largest measuring 2.2 x 2.0 cm in the anterior segment of the right lobe near the dome. The next largest visualize mass is just anterior to the mass does describe measuring 2.2 x 1.8 cm. There are no blastic or lytic bone lesions. Patient is status post coronary artery bypass grafting. Thyroid appears normal.  Review of the MIP images confirms the above findings.  IMPRESSION: No demonstrable pulmonary embolus.  Findings felt to represent a degree of congestive heart failure  superimposed on emphysema.  Left lower lobe mass with progression of adenopathy compared to prior study. Liver metastases also noted.   Electronically Signed   By: Lowella Grip M.D.   On: 12/06/2013 13:57   Dg Chest Port 1 View  12/09/2013   CLINICAL DATA:  Suspect CHF, history of lung malignancy  EXAM: PORTABLE CHEST - 1 VIEW  COMPARISON:  CT ANGIO CHEST W/CM &/OR WO/CM dated 12/06/2013; DG CHEST 2 VIEW dated 12/02/2013  FINDINGS: The lungs are well-expanded. There is no focal infiltrate. There is no evidence of pulmonary interstitial or alveolar edema. The cardiac silhouette is normal in size. The pulmonary vascularity is not engorged. There is density in the left infrahilar region which may reflect the patient's known lower lobe mass lesion. It is less conspicuous than on December 02, 2013. The patient has undergone previous CABG. The observed portions of the bony thorax are normal.  IMPRESSION: There is mild hyperinflation which may reflect underlying COPD. There is no evidence of pneumonia nor CHF. Abnormal density in the left infrahilar region is less conspicuous today.  These results were called by telephone at the time of interpretation on 12/09/2013 at 1:11 PM to Doristine Devoid, RN,, who verbally acknowledged these results.   Electronically Signed   By: David  Martinique   On: 12/09/2013 13:16    Microbiology: Recent Results (from the past 240 hour(s))  CULTURE, BLOOD (ROUTINE X 2)     Status: None   Collection Time    12/02/13 11:30 PM      Result Value Range Status   Specimen Description BLOOD LEFT ARM   Final   Special Requests BOTTLES DRAWN AEROBIC AND ANAEROBIC 5CC   Final   Culture  Setup Time     Final   Value: 12/03/2013 04:46     Performed at [facility]   Culture     Final   Value: NO GROWTH 5 DAYS     Performed at [facility]   Report Status 12/09/2013 FINAL   Final  CULTURE, BLOOD (ROUTINE X 2)     Status: None   Collection Time    12/02/13 11:45 PM       Result Value Range Status   Specimen Description BLOOD RIGHT ARM   Final   Special Requests BOTTLES DRAWN AEROBIC AND ANAEROBIC 5CC   Final   Culture  Setup Time     Final   Value: 12/03/2013 04:47     Performed at [facility]   Culture     Final   Value: NO GROWTH 5 DAYS     Performed at [facility]   Report Status 12/09/2013 FINAL   Final  URINE CULTURE     Status: None   Collection Time    12/03/13  2:18 AM      Result Value Range Status   Specimen Description URINE, RANDOM   Final   Special Requests NONE   Final   Culture  Setup Time     Final   Value: 12/03/2013 15:13     Performed at [facility]     Final   Value: 3,000 COLONIES/ML     Performed at [facility]   Culture     Final   Value: INSIGNIFICANT GROWTH     Performed at [facility]   Report Status 12/04/2013 FINAL   Final  Labs: Basic Metabolic Panel:  Recent Labs Lab 12/06/13 0448 12/11/13 0421  NA  --  137  K  --  3.1*  CL  --  94*  CO2  --  33*  GLUCOSE  --  111*  BUN  --  14  CREATININE 0.92 0.89  CALCIUM  --  8.1*   Liver Function Tests:  Recent Labs Lab 12/11/13 0421  AST 17  ALT 12  ALKPHOS 65  BILITOT 0.4  PROT 5.2*  ALBUMIN 2.4*   No results found for this basename: LIPASE, AMYLASE,  in the last 168 hours No results found for this basename: AMMONIA,  in the last 168 hours CBC:  Recent Labs Lab 12/06/13 0448 12/07/13 1415 12/08/13 0413 12/09/13 0346 12/11/13 0421  WBC 1.8* 3.5* 4.4 10.1 7.1  NEUTROABS 0.8* 2.1  --  7.0  --   HGB 8.2* 8.7* 7.1* 8.1* 8.2*  HCT 23.3* 25.2* 20.5* 23.6* 24.2*  MCV 93.6 94.4 94.0 95.2 96.4  PLT 105* 146* 139* 200 200   Cardiac Enzymes: No results found for this basename: CKTOTAL, CKMB, CKMBINDEX, TROPONINI,  in the last 168 hours BNP: BNP (last 3 results) No results found for this basename: PROBNP,  in the last 8760 hours CBG: No results found for this basename: GLUCAP,  in the  last 168 hours     Signed:  Nita Sells  [facility] 12/12/2013, 12:25 PM

## 2013-12-12 NOTE — Progress Notes (Signed)
  SATURATION QUALIFICATIONS: (This note is used to comply with regulatory documentation for home oxygen)  Patient Saturations on Room Air at Rest = 96%  Patient Saturations on Room Air while Ambulating = 88%  Patient Saturations on 2 Liters of oxygen while Ambulating = 96%  Please briefly explain why patient needs home oxygen: to maintain oxygen saturations greater than 88%

## 2013-12-12 NOTE — Progress Notes (Addendum)
[facility]   [facility] is providing the following services: Oxygen, Bed, Nebulizer  If patient discharges after hours, please call [phone].   Linward Headland 12/12/2013, 1:19 PM

## 2013-12-12 NOTE — Progress Notes (Signed)
Subjective: The patient is seen and examined today. His wife was at the bedside. He is feeling a good bit better today especially after starting treatment with prednisone. His wife is not interested in considering palliative care evaluation at this point and she would like a second opinion at [facility] for discussion of this treatment options.  Objective: Vital signs in last 24 hours: Temp:  [97.4 F (36.3 C)-98 F (36.7 C)] 97.6 F (36.4 C) (02/09 1527) Pulse Rate:  [72-94] 73 (02/09 1527) Resp:  [17-18] 18 (02/09 1527) BP: (106-158)/(72-89) 106/85 mmHg (02/09 1527) SpO2:  [88 %-100 %] 96 % (02/09 1527) Weight:  [135 lb 3.2 oz (61.326 kg)] 135 lb 3.2 oz (61.326 kg) (02/09 0500)  Intake/Output from previous day: 02/08 0701 - 02/09 0700 In: 480 [P.O.:480] Out: 750 [Urine:750] Intake/Output this shift: Total I/O In: 240 [P.O.:240] Out: -   General appearance: alert, cooperative, fatigued and no distress Resp: clear to auscultation bilaterally Cardio: regular rate and rhythm, S1, S2 normal, no murmur, click, rub or gallop GI: soft, non-tender; bowel sounds normal; no masses,  no organomegaly Extremities: extremities normal, atraumatic, no cyanosis or edema  Lab Results:   Recent Labs  12/11/13 0421  WBC 7.1  HGB 8.2*  HCT 24.2*  PLT 200   BMET  Recent Labs  12/11/13 0421  NA 137  K 3.1*  CL 94*  CO2 33*  GLUCOSE 111*  BUN 14  CREATININE 0.89  CALCIUM 8.1*    Studies/Results: No results found.  Medications: I have reviewed the patient's current medications.    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 I have a  lengthy discussion with the patient and his wife today about his current disease status and treatment options. I explained to the patient and his wife that his options include palliative care and hospice referral versus consideration of systemic chemotherapy and I would consider him for regimen consisting of carboplatin and Alimta once his performance status improves to exclude the systemic chemotherapy. His wife mentions interested in seeking a second opinion at [facility] for discussion of the treatment options. I will arrange an appointment for her with Dr. Mindi Junker. 2) neutropenic fever resolved. 3) dyspnea: Secondary to progressive lung cancer as well as COPD. Improved on steroids but this needs to be tapered gradually. 4) I will arrange for the patient to have a followup appointment with me in 2 weeks for evaluation and discussion of his treatment options after his visit at [facility]: 10 days    Reghan Thul K. 12/12/2013

## 2013-12-12 NOTE — Progress Notes (Signed)
Physical Therapy Treatment Patient Details Name: Cameron [name] MRN: [id number] DOB: 06-24-1941 Today's Date: 12/12/2013 Time: 3875-6433 PT Time Calculation (min): 24 min  PT Assessment / Plan / Recommendation  History of Present Illness  Cameron [name] is a 73 y.o. male with a history of [place] Lung Cancer of left Lung  Diagnosed in 09/2013 currently on Chemotherapy and Radiation Therapy who presents to the ED with complaints of fevers chills rigor and increased coughing for the past 2-3 days.   He reports coughing up whitish sputum.   At home he reported having a temperature of 102.0.   In the ED, he was evaluated and found to have a WBC count of 1.5 with an ANS =1.1. He was placed on Neutropenic precautions and blood cultures were sent, and he was placed on IV antibiotic Rx of Vancomycin and Levaquin   PT Comments   Pt in bed on 2 lts with spouse in room.  Assisted pt OOB to amb in hallway on 2 lts sats avg 96% with avg HR 83.  Pt demon 3/4 DOE and required several sitting rest break.  Spouse assisted by following with chair.    Follow Up Recommendations  Home health PT     Does the patient have the potential to tolerate intense rehabilitation     Barriers to Discharge        Equipment Recommendations  None recommended by PT    Recommendations for Other Services    Frequency Min 3X/week   Progress towards PT Goals Progress towards PT goals: Progressing toward goals  Plan      Precautions / Restrictions Precautions Precautions: Fall Precaution Comments: monitor O2 Restrictions Weight Bearing Restrictions: No   Pertinent Vitals/Pain No c/o pain    Mobility  Bed Mobility Overal bed mobility: Modified Independent General bed mobility comments: increased time Transfers Overall transfer level: Modified independent Equipment used: None Transfers: Sit to/from Stand Sit to Stand: Modified independent (Device/Increase time) General transfer comment: good safety cognition and use  of hands to steady self when needed Ambulation/Gait Ambulation Distance (Feet): 360 Feet (120' x 3 sitting rest breaks) Assistive device: None Gait velocity: slow General Gait Details: at times , some veering to side,esp with increased fatigue  pt corrects  self and requires freq sitting rest breaks       PT Goals (current goals can now be found in the care plan section)    Visit Information  Last PT Received On: 12/12/13 Assistance Needed: +1 History of Present Illness:  Cameron [name] is a 73 y.o. male with a history of [place] Lung Cancer of left Lung  Diagnosed in 09/2013 currently on Chemotherapy and Radiation Therapy who presents to the ED with complaints of fevers chills rigor and increased coughing for the past 2-3 days.   He reports coughing up whitish sputum.   At home he reported having a temperature of 102.0.   In the ED, he was evaluated and found to have a WBC count of 1.5 with an ANS =1.1. He was placed on Neutropenic precautions and blood cultures were sent, and he was placed on IV antibiotic Rx of Vancomycin and Levaquin    Subjective Data      Cognition       Balance     End of Session PT - End of Session Equipment Utilized During Treatment: Gait belt;Oxygen Activity Tolerance: Patient limited by fatigue Patient left: in bed;with call bell/phone within reach;with family/visitor present   Rica Koyanagi  PTA [facility]  Acute  Rehab Pager      [phone]

## 2013-12-13 ENCOUNTER — Telehealth: Payer: Self-pay | Admitting: Internal Medicine

## 2013-12-13 ENCOUNTER — Ambulatory Visit: Payer: Medicare Other

## 2013-12-13 NOTE — Telephone Encounter (Signed)
Pt appt. To see Dr. Mindi Junker @ [facility] is 12/20/13@8 :22. Medical records faxed. Slides and scans will be fedex'ed. Left pt vm in ref to referral

## 2013-12-13 NOTE — Progress Notes (Signed)
Chart reviewed. Patient stable BP 129/62  Pulse 71  Temp(Src) 98 F (36.7 C) (Oral)  Resp 18  Ht 5\' 8"  (1.727 m)  Wt 62.1 kg (136 lb 14.5 oz)  BMI 20.82 kg/m2  SpO2 95% only c/o mild pleurisy No changes to d/c summary Await hospital bed delivery  Verneita Griffes, MD [facility] [phone]

## 2013-12-13 NOTE — Telephone Encounter (Signed)
lvm for pt regarding to Feb appt...Marland KitchenMailed pt avs and letter

## 2013-12-14 ENCOUNTER — Ambulatory Visit: Payer: Medicare Other

## 2013-12-15 ENCOUNTER — Ambulatory Visit: Payer: Medicare Other

## 2013-12-15 ENCOUNTER — Telehealth: Payer: Self-pay | Admitting: *Deleted

## 2013-12-15 NOTE — Telephone Encounter (Signed)
OK tapering off: Prednisone 10 mg: take 4 tabs a day x 3 days; then 3 tabs a day x 4 days; then 2 tabs a day x 4 days, then 1 tab a day x 6 days, then stop. Take pc.  Thx!

## 2013-12-15 NOTE — Telephone Encounter (Signed)
Darden Amber, RN with [facility] phoned and stating when patient was d/c'ed from hospital, he was sent home on prednisone 60mg  daily for 10 days-with no tapering off dose.  Is requesting VO to taper patient off rather than abruptly stop 60 mg.  CB# [phone]

## 2013-12-16 ENCOUNTER — Ambulatory Visit: Payer: Medicare Other

## 2013-12-16 NOTE — Telephone Encounter (Signed)
Phoned Kenney Houseman, RN and gave verbal order per MD.  She is notifying patient.

## 2013-12-19 ENCOUNTER — Ambulatory Visit: Payer: Medicare Other

## 2013-12-19 ENCOUNTER — Other Ambulatory Visit: Payer: Self-pay

## 2013-12-19 MED ORDER — METOPROLOL SUCCINATE ER 50 MG PO TB24: 50.0000 mg | ORAL_TABLET | Freq: Every day | ORAL | Status: AC

## 2013-12-20 ENCOUNTER — Ambulatory Visit: Payer: Medicare Other

## 2013-12-21 ENCOUNTER — Telehealth: Payer: Self-pay | Admitting: *Deleted

## 2013-12-21 ENCOUNTER — Ambulatory Visit: Payer: Medicare Other

## 2013-12-21 DIAGNOSIS — Z0279 Encounter for issue of other medical certificate: Secondary | ICD-10-CM

## 2013-12-21 MED ORDER — OXYCODONE HCL 5 MG PO TABS: 5.0000 mg | ORAL_TABLET | Freq: Four times a day (QID) | ORAL | Status: DC | PRN

## 2013-12-21 NOTE — Telephone Encounter (Signed)
Try Oxycodone - pick up the Rx Thx

## 2013-12-21 NOTE — Telephone Encounter (Signed)
Rx upfront. Pt's wife informed.

## 2013-12-21 NOTE — Telephone Encounter (Signed)
Spouse phoned triage line, as well as Jay, Kaitlin, regarding patient's pain.  States he was d/c'ed from hospital on 2/10 with tramadol, and it is INEFFECTIVE.  Nurse states that patient's pain scale is 10/10 at night when he is attempting to sleep, legs, knees, ankles, and he cannot sleep.  Nurse further states that rather than taking as instructed at q6h prn, patient is taking q3h.  Please advise.  CB# [phone] for spouse.

## 2013-12-22 ENCOUNTER — Telehealth: Payer: Self-pay | Admitting: Internal Medicine

## 2013-12-22 NOTE — Telephone Encounter (Signed)
pt called to r/s appt due to not going to [facility] to 2.24.15....done

## 2013-12-23 ENCOUNTER — Telehealth: Payer: Self-pay | Admitting: Internal Medicine

## 2013-12-23 ENCOUNTER — Telehealth: Payer: Self-pay | Admitting: Radiation Oncology

## 2013-12-23 MED ORDER — OXYCODONE HCL 5 MG PO TABS: 5.0000 mg | ORAL_TABLET | Freq: Four times a day (QID) | ORAL | Status: DC | PRN

## 2013-12-23 NOTE — Telephone Encounter (Signed)
Did he pick up a Rx for roxicodone on 2/18? He can take 5-15 mg qid prn. Ok to re-issue Thx

## 2013-12-23 NOTE — Telephone Encounter (Signed)
Faxed signed 90 day refill authorization form for Carafate back to [facility]. Confirmation fax of delivery obtained.

## 2013-12-23 NOTE — Telephone Encounter (Deleted)
Placed 90 day refill request form in Dr. Johny Shears inbox on his desk for completion.

## 2013-12-23 NOTE — Telephone Encounter (Signed)
Pt's wife informed Rx upfront for p/u.

## 2013-12-23 NOTE — Telephone Encounter (Signed)
Becky called from [facility] stated that Mrs. Cameron [name] request something stronger than oxycontin. Pt is in pain and this med is not working. Becky ask to contact pt of Dr. Camila Li advise

## 2013-12-27 ENCOUNTER — Other Ambulatory Visit: Payer: Medicare Other

## 2013-12-27 ENCOUNTER — Ambulatory Visit: Payer: Medicare Other | Admitting: Internal Medicine

## 2014-01-02 ENCOUNTER — Telehealth: Payer: Self-pay | Admitting: *Deleted

## 2014-01-02 DIAGNOSIS — Z5111 Encounter for antineoplastic chemotherapy: Secondary | ICD-10-CM

## 2014-01-02 DIAGNOSIS — J45901 Unspecified asthma with (acute) exacerbation: Secondary | ICD-10-CM

## 2014-01-02 DIAGNOSIS — J441 Chronic obstructive pulmonary disease with (acute) exacerbation: Secondary | ICD-10-CM

## 2014-01-02 DIAGNOSIS — C3490 Malignant neoplasm of unspecified part of unspecified bronchus or lung: Secondary | ICD-10-CM

## 2014-01-02 DIAGNOSIS — E46 Unspecified protein-calorie malnutrition: Secondary | ICD-10-CM

## 2014-01-02 MED ORDER — HYDROMORPHONE HCL 4 MG PO TABS: 4.0000 mg | ORAL_TABLET | Freq: Three times a day (TID) | ORAL | Status: AC | PRN

## 2014-01-02 NOTE — Telephone Encounter (Signed)
Use Hydromorphone in place of Oxycodone Use Klonopin 0.5-1 mg po qid for restlessness Thx

## 2014-01-02 NOTE — Telephone Encounter (Signed)
Phoned spouse and relayed MD's instructions, new script and new klonopin instructions.  Spouse verbalized understanding.

## 2014-01-02 NOTE — Telephone Encounter (Signed)
Spouse phoned stating the oxycontin wasn't "working for patient's pain and also, he's fidgity, and can't sleep".  Please advise if possible prior to OV tomorrow afternoon.  CB# [phone]

## 2014-01-03 ENCOUNTER — Ambulatory Visit: Payer: Medicare Other | Admitting: Internal Medicine

## 2014-01-03 ENCOUNTER — Telehealth: Payer: Self-pay

## 2014-01-03 MED ORDER — MAGIC MOUTHWASH: 5.0000 mL | Freq: Four times a day (QID) | ORAL | Status: AC

## 2014-01-03 NOTE — Telephone Encounter (Signed)
The patient's wife called and canceled the pt's apt this afternoon stating he was too sick to come in.  She states he is unable to get out of bed.  She is hoping to get a rx called in for magic mouth wash to help his dry mouth problems.

## 2014-01-03 NOTE — Addendum Note (Signed)
Addended by: Cassandria Anger on: 01/03/2014 05:30 PM   Modules accepted: Orders

## 2014-01-03 NOTE — Telephone Encounter (Signed)
OK magic mouthwash  -- done   Thx

## 2014-01-04 ENCOUNTER — Other Ambulatory Visit: Payer: Medicare Other

## 2014-01-04 ENCOUNTER — Telehealth: Payer: Self-pay | Admitting: Internal Medicine

## 2014-01-04 ENCOUNTER — Telehealth: Payer: Self-pay | Admitting: *Deleted

## 2014-01-04 ENCOUNTER — Ambulatory Visit: Payer: Medicare Other | Admitting: Internal Medicine

## 2014-01-04 DIAGNOSIS — C3490 Malignant neoplasm of unspecified part of unspecified bronchus or lung: Secondary | ICD-10-CM

## 2014-01-04 NOTE — Telephone Encounter (Signed)
pt called to cx appt due to being sick....pt will call back to r/s

## 2014-01-04 NOTE — Telephone Encounter (Signed)
Phoned and left voicemail message with Benjamine Mola, PT that hospice referral order had been placed.

## 2014-01-04 NOTE — Telephone Encounter (Signed)
Cameron [name], PT with [facility], phoned requesting hospice referral.  Patient has been sleeping more & more, no PO intake for over 3 days, rapidly deteriorating and family needs additional assistance that hospice can provide.  Please advise & I'll put in order.  CB# [phone]

## 2014-01-04 NOTE — Telephone Encounter (Signed)
Ok Will do [employer]

## 2014-01-05 ENCOUNTER — Telehealth: Payer: Self-pay | Admitting: *Deleted

## 2014-01-09 ENCOUNTER — Ambulatory Visit: Payer: Medicare Other | Admitting: Internal Medicine

## 2014-01-09 ENCOUNTER — Other Ambulatory Visit: Payer: Medicare Other

## 2014-01-19 ENCOUNTER — Ambulatory Visit: Payer: Medicare Other | Admitting: Cardiology

## 2014-01-28 IMAGING — CR DG CHEST 2V
2 series · 2 of 2 positions shown · non-contrast
Comparison: PET-CT 10/12/2013; chest radiograph 09/05/2013.

CLINICAL DATA: Cough.  History of lung cancer.

EXAM:
CHEST  2 VIEW

[view not recorded (1 of 2)]
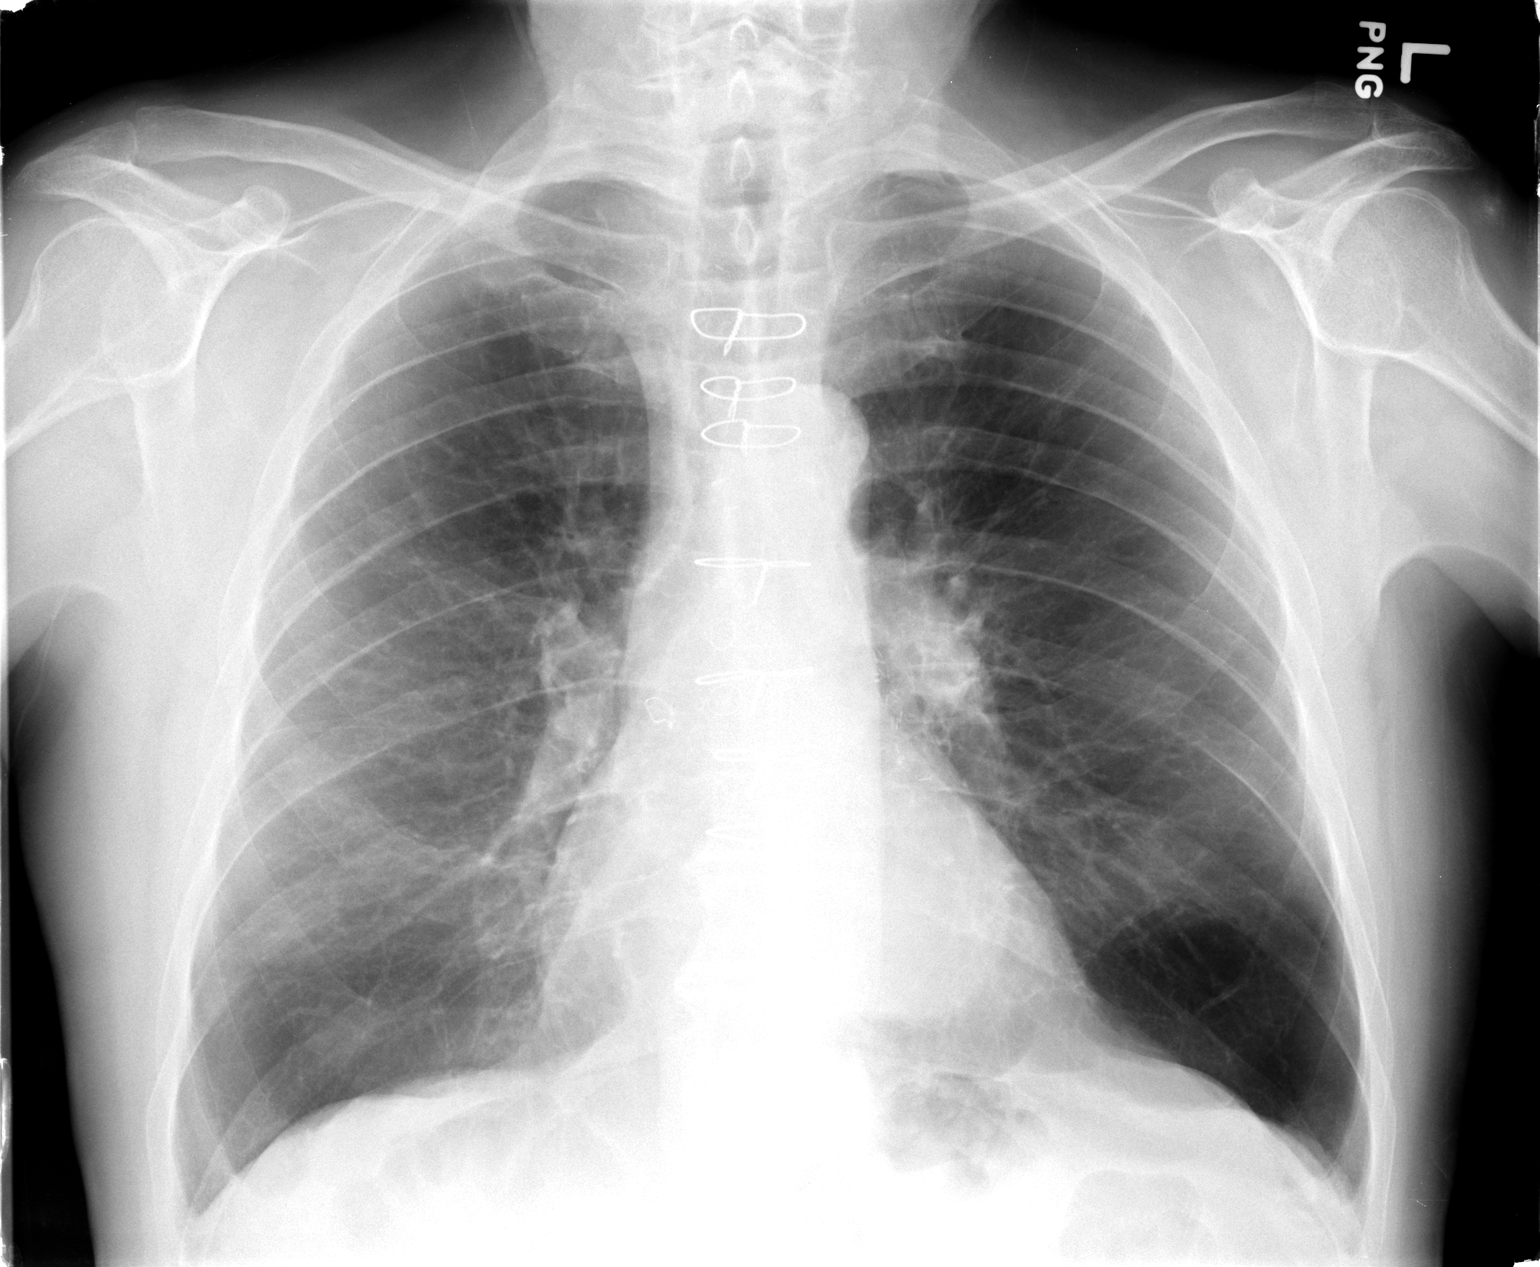

[view not recorded (2 of 2)]
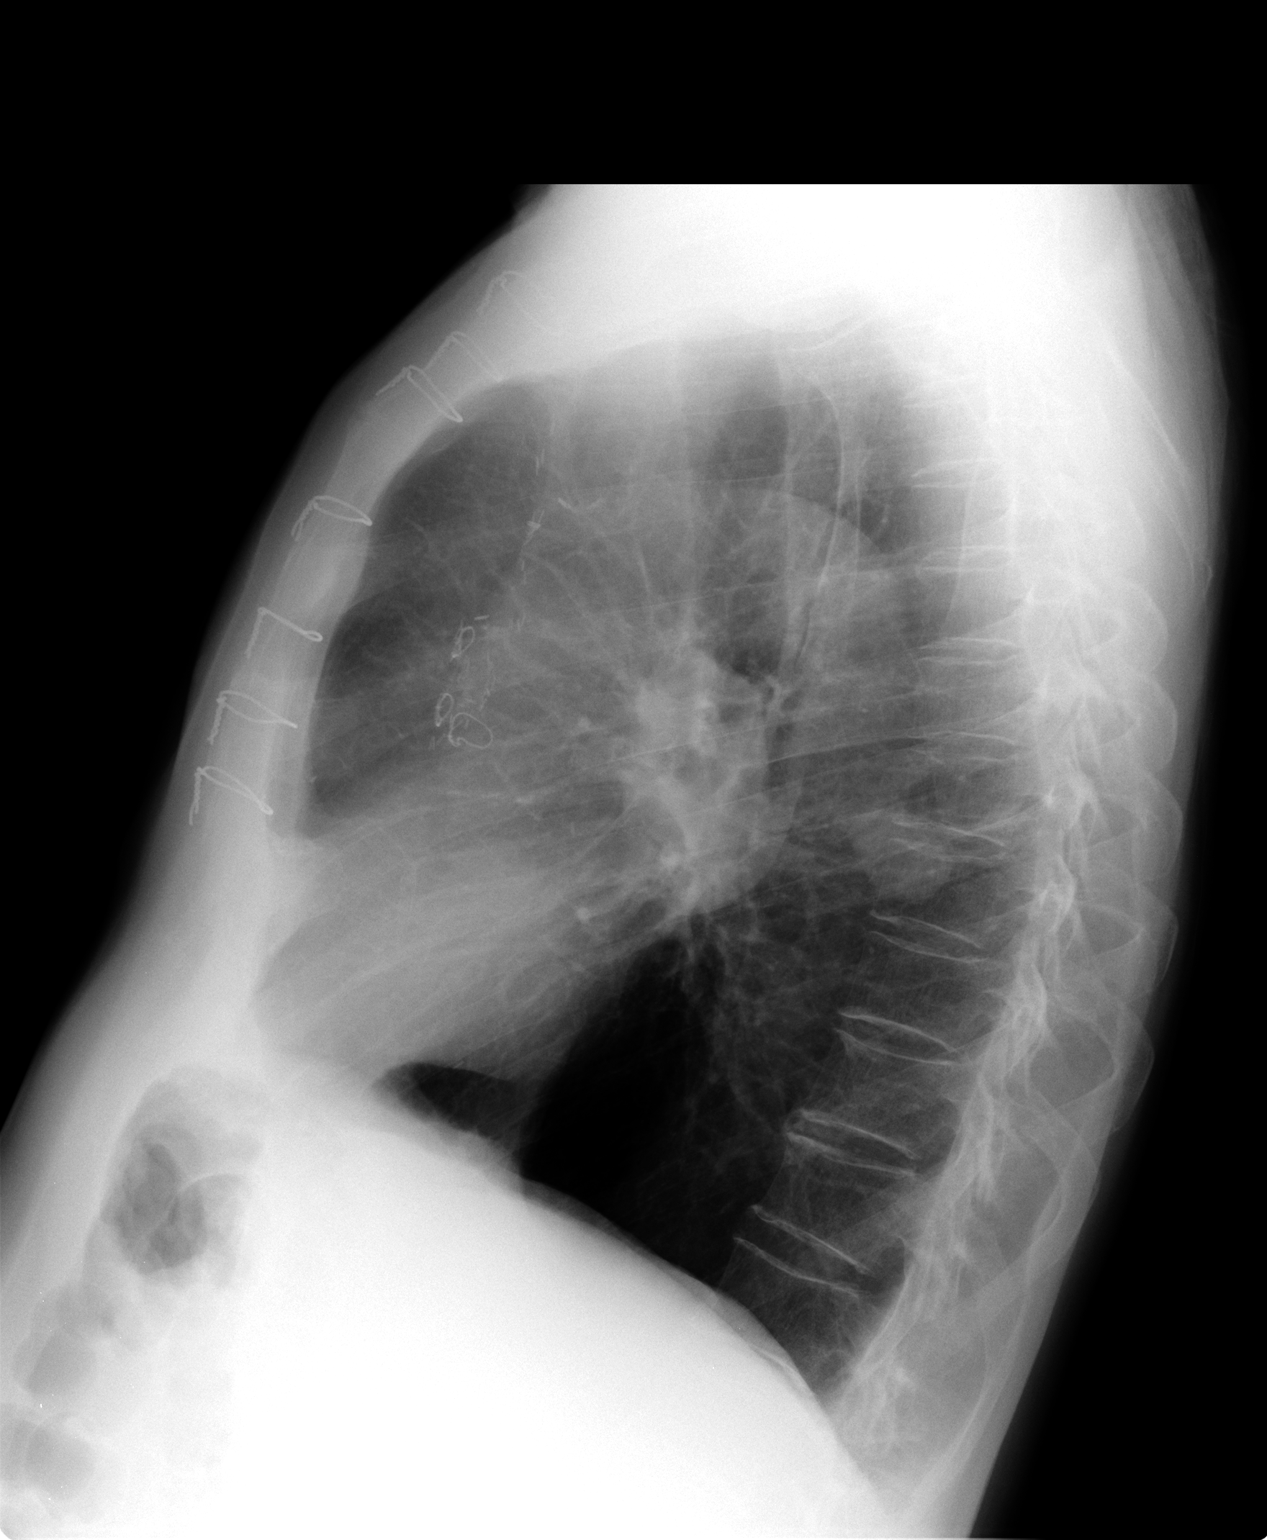

[2 of 2 positions shown; findings below may reference images not displayed]

FINDINGS: Stable cardiac and mediastinal contours status post median
sternotomy. No large consolidative pulmonary opacity. No definite
pleural effusion or pneumothorax. Extensive emphysematous change.
Re- demonstrated 2.5 cm mass within the superior segment left lower
lobe.
IMPRESSION: Re- demonstrated 2.5 cm mass within the superior segment left lower
lobe.

No evidence for superimposed acute cardiopulmonary process.

## 2014-02-01 NOTE — Telephone Encounter (Signed)
Per EMS on scene, patient was pronounced at 1101, PCP spoke with them personally.

## 2014-02-01 NOTE — Telephone Encounter (Signed)
Cameron [name], Carter, phoned regarding status of hospice referral.  States that spouse reports patient's change in breathing.    Also requests to know if PCP will remain attending and if he wants to order sxs mgt.  CB# [phone]

## 2014-02-01 DEATH — deceased

## 2014-03-01 ENCOUNTER — Encounter (HOSPITAL_COMMUNITY): Payer: Self-pay

## 2014-03-12 IMAGING — CT CT ANGIO CHEST
2 of 6 series · 18 of 36 positions shown · IV contrast (OMNIPAQUE)
Comparison: Chest radiograph December 02, 2013 and chest CT [facility]

CLINICAL DATA: Chest pain ; history lung carcinoma

EXAM:
CT ANGIOGRAPHY CHEST WITH CONTRAST
TECHNIQUE: Multidetector CT imaging of the chest was performed using the
standard protocol during bolus administration of intravenous
contrast. Multiplanar CT image reconstructions including MIPs were
obtained to evaluate the vascular anatomy.
CONTRAST:  100mL OMNIPAQUE IOHEXOL 350 MG/ML SOLN

[Series 8: pe thins @ 1mm · axial · 0.68mm/px · z∈[+1516,+1744]mm · 17 of 254 slices shown]
[im 13/254  lung]
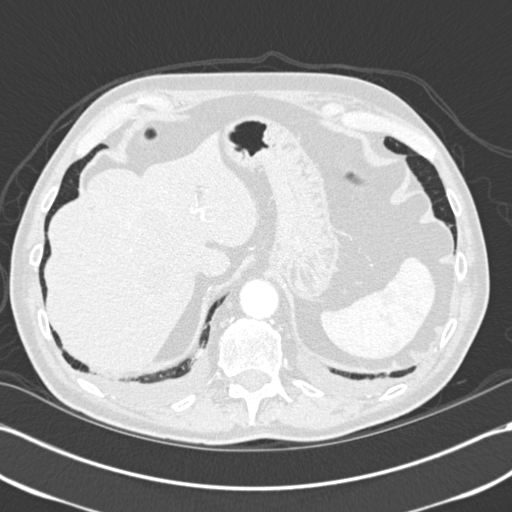
[im 26/254  mediastinal]
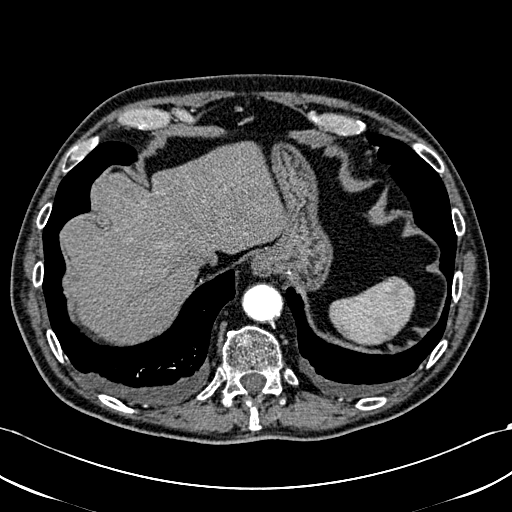
[im 38/254  lung]
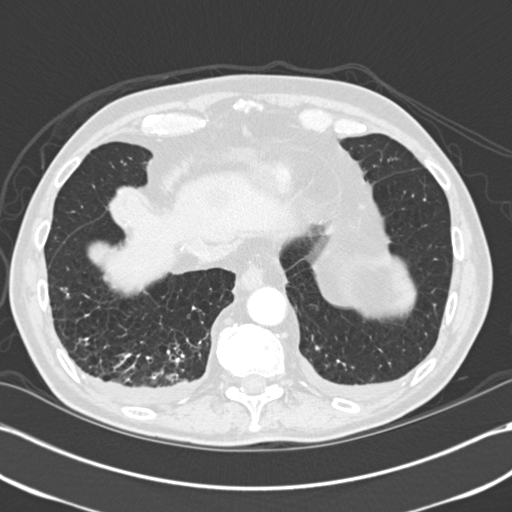
[im 51/254  mediastinal]
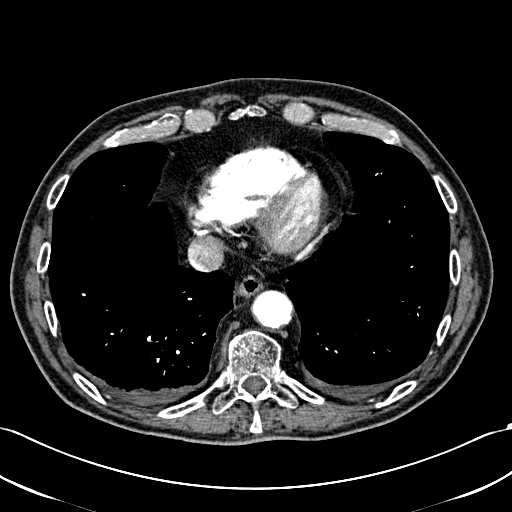
[im 76/254  lung]
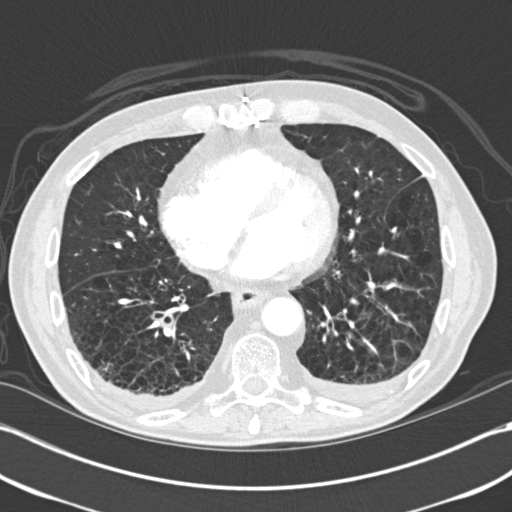
[im 89/254  mediastinal]
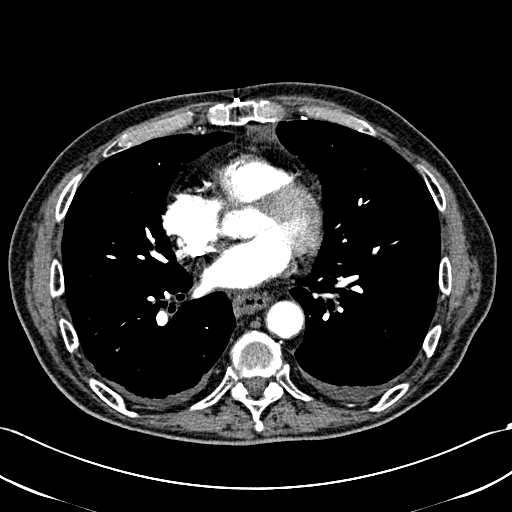
[im 102/254  lung]
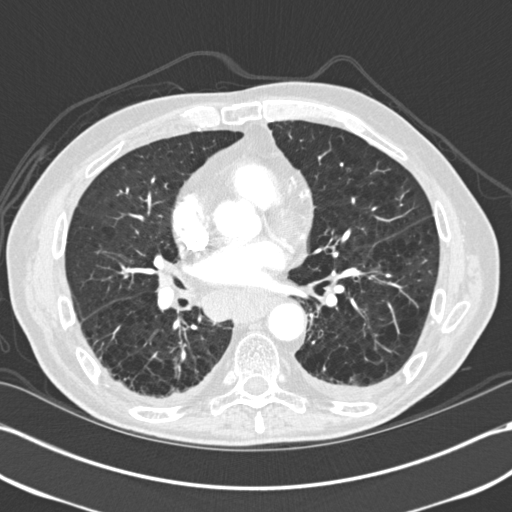
[im 114/254  mediastinal]
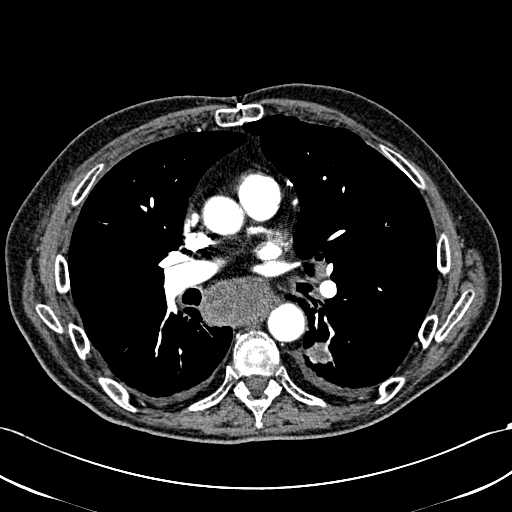
[im 127/254  lung]
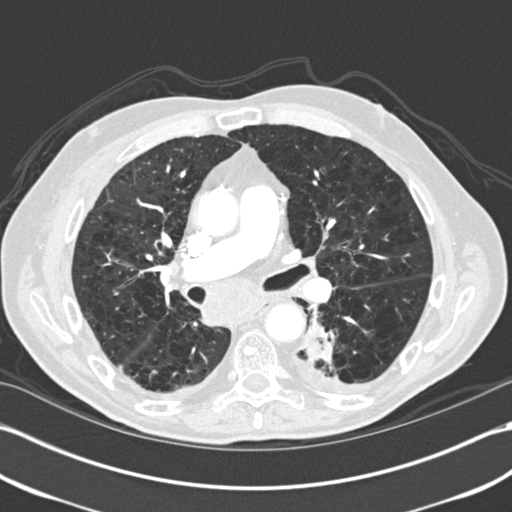
[im 140/254  mediastinal]
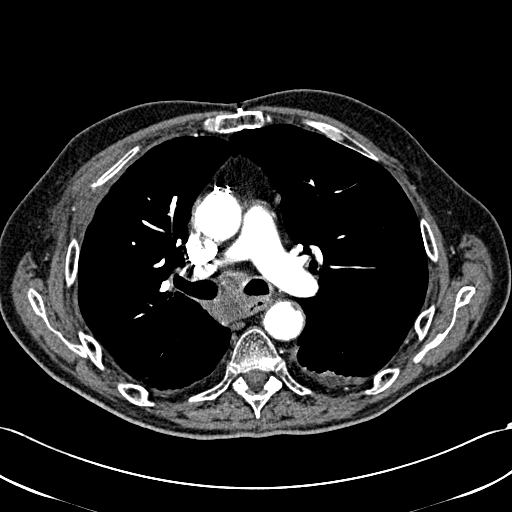
[im 152/254  lung]
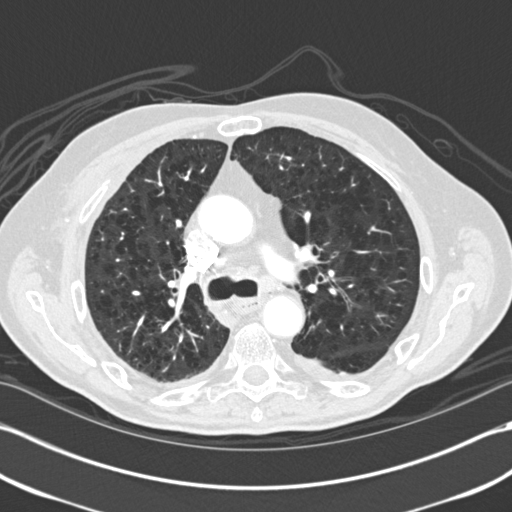
[im 165/254  mediastinal]
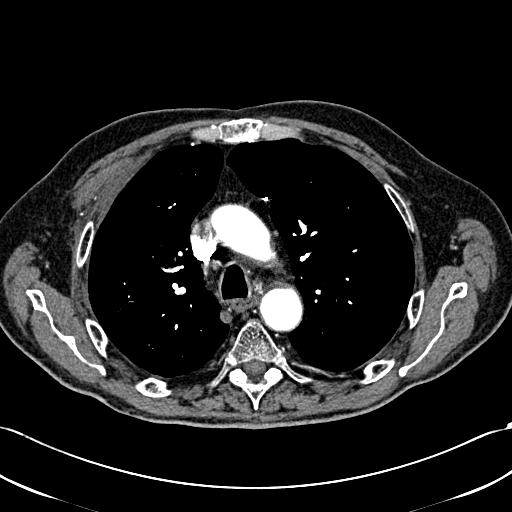
[im 178/254  lung]
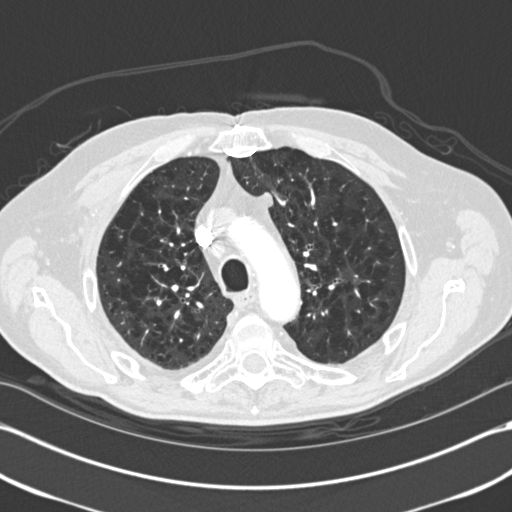
[im 203/254  mediastinal]
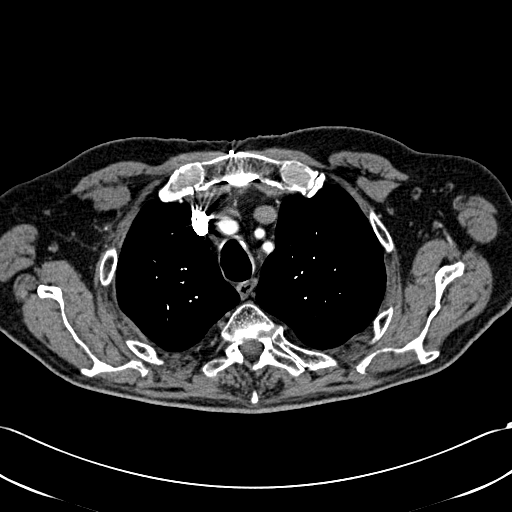
[im 216/254  lung]
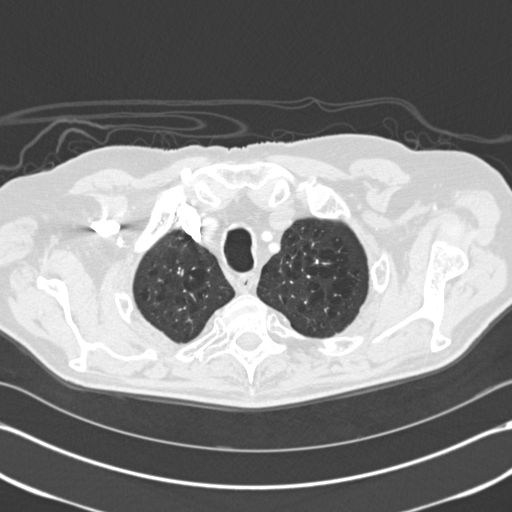
[im 228/254  mediastinal]
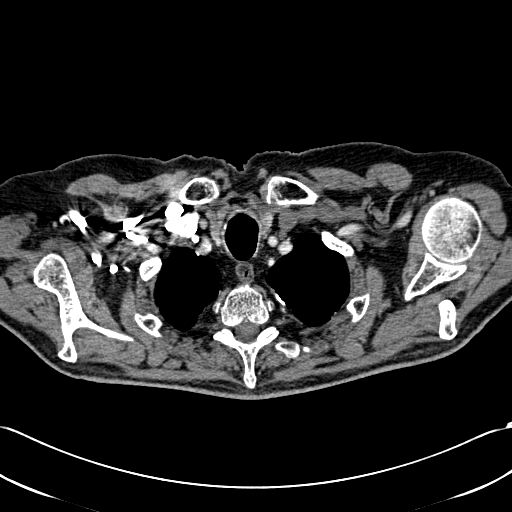
[im 241/254  lung]
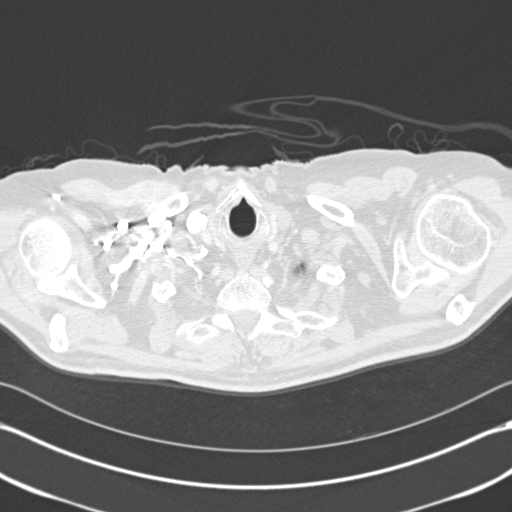

[Series 602: coronal mpr · coronal · 0.68mm/px · 1 of 126 slices shown]
[im 63/126  mediastinal]
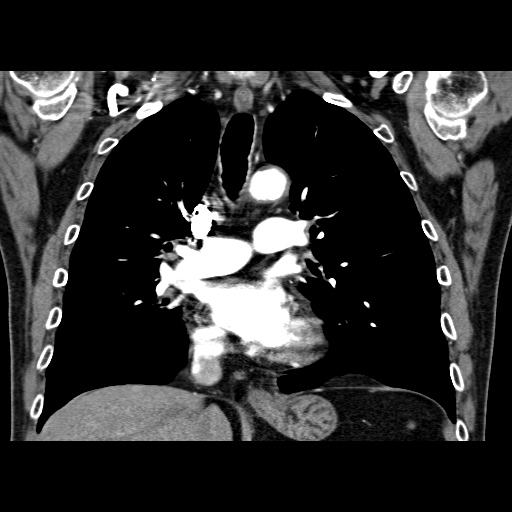

[18 of 36 positions shown; findings below may reference images not displayed]

FINDINGS: There is no demonstrable pulmonary embolus. There is no thoracic
aortic aneurysm or dissection.

There is underlying emphysema. There is again noted a mass in the
medial aspect of the superior segment of the left lower lobe. This
mass currently measures 2.4 x 2.2 cm, essentially stable compared to
recent prior study. There are small pleural effusions bilaterally.
There is lower lobe bronchiectatic change, more on the right than on
the left. There is scarring in the right lower lobe with some new
interstitial prominence in the right lower lobe, possibly
representing mild interstitial edema. Changes of this nature to a
lesser extent are also seen in the left lower lobe. A small
calcified granuloma is noted in the right lower lobe superior
segment, stable.

There is sub- carinal adenopathy currently measuring 5.1 by 3.5,
increased in size compared to recent prior study. There is a lymph
node between the carina and descending aorta on the left a measuring
1.4 x 1.4 cm. There are several smaller lymph nodes in the
mediastinum is well.

Pericardium is not thickened.

In the visualized upper abdomen, there are masses in the visualized
liver, largest measuring 2.2 x 2.0 cm in the anterior segment of the
right lobe near the dome. The next largest visualize mass is just
anterior to the mass does describe measuring 2.2 x 1.8 cm. There are
no blastic or lytic bone lesions. Patient is status post coronary
artery bypass grafting. Thyroid appears normal.

Review of the MIP images confirms the above findings.
IMPRESSION: No demonstrable pulmonary embolus.

Findings felt to represent a degree of congestive heart failure
superimposed on emphysema.

Left lower lobe mass with progression of adenopathy compared to
prior study. Liver metastases also noted.

## 2014-06-27 ENCOUNTER — Other Ambulatory Visit: Payer: Self-pay | Admitting: Pharmacist
